# Patient Record
Sex: Female | Born: 1940 | Race: White | Hispanic: No | Marital: Single | Smoking: Never smoker
Health system: Southern US, Community
[De-identification: ages and names within clinical notes are randomized; demographics above are authoritative.]

## PROBLEM LIST (undated history)

## (undated) DIAGNOSIS — E7800 Pure hypercholesterolemia, unspecified: Secondary | ICD-10-CM

## (undated) DIAGNOSIS — F419 Anxiety disorder, unspecified: Secondary | ICD-10-CM

## (undated) DIAGNOSIS — I10 Essential (primary) hypertension: Secondary | ICD-10-CM

## (undated) DIAGNOSIS — M109 Gout, unspecified: Secondary | ICD-10-CM

## (undated) DIAGNOSIS — K922 Gastrointestinal hemorrhage, unspecified: Secondary | ICD-10-CM

## (undated) DIAGNOSIS — I350 Nonrheumatic aortic (valve) stenosis: Secondary | ICD-10-CM

## (undated) HISTORY — PX: DILATION AND CURETTAGE OF UTERUS: SHX78

---

## 1998-04-09 ENCOUNTER — Inpatient Hospital Stay (HOSPITAL_COMMUNITY): Admission: EM | Admit: 1998-04-09 | Discharge: 1998-04-10 | Payer: Self-pay | Admitting: Emergency Medicine

## 1998-04-09 ENCOUNTER — Encounter: Payer: Self-pay | Admitting: Emergency Medicine

## 2005-12-01 ENCOUNTER — Ambulatory Visit: Payer: Self-pay

## 2006-01-28 ENCOUNTER — Ambulatory Visit: Payer: Self-pay | Admitting: Unknown Physician Specialty

## 2006-10-19 ENCOUNTER — Other Ambulatory Visit: Payer: Self-pay

## 2006-10-19 ENCOUNTER — Ambulatory Visit: Payer: Self-pay | Admitting: Unknown Physician Specialty

## 2006-10-20 ENCOUNTER — Ambulatory Visit: Payer: Self-pay | Admitting: Unknown Physician Specialty

## 2017-03-21 ENCOUNTER — Emergency Department: Payer: Medicare Other

## 2017-03-21 ENCOUNTER — Inpatient Hospital Stay
Admission: EM | Admit: 2017-03-21 | Discharge: 2017-03-26 | DRG: 644 | Disposition: A | Discharge status: Home / Self Care | Payer: Medicare Other | Attending: Specialist | Admitting: Specialist

## 2017-03-21 ENCOUNTER — Encounter: Payer: Self-pay | Admitting: Emergency Medicine

## 2017-03-21 DIAGNOSIS — T502X5A Adverse effect of carbonic-anhydrase inhibitors, benzothiadiazides and other diuretics, initial encounter: Secondary | ICD-10-CM | POA: Diagnosis present

## 2017-03-21 DIAGNOSIS — S7012XA Contusion of left thigh, initial encounter: Secondary | ICD-10-CM | POA: Diagnosis present

## 2017-03-21 DIAGNOSIS — E7800 Pure hypercholesterolemia, unspecified: Secondary | ICD-10-CM | POA: Diagnosis present

## 2017-03-21 DIAGNOSIS — M1990 Unspecified osteoarthritis, unspecified site: Secondary | ICD-10-CM | POA: Diagnosis present

## 2017-03-21 DIAGNOSIS — F10239 Alcohol dependence with withdrawal, unspecified: Secondary | ICD-10-CM | POA: Diagnosis present

## 2017-03-21 DIAGNOSIS — I10 Essential (primary) hypertension: Secondary | ICD-10-CM | POA: Diagnosis present

## 2017-03-21 DIAGNOSIS — E222 Syndrome of inappropriate secretion of antidiuretic hormone: Principal | ICD-10-CM | POA: Diagnosis present

## 2017-03-21 DIAGNOSIS — X58XXXA Exposure to other specified factors, initial encounter: Secondary | ICD-10-CM | POA: Diagnosis present

## 2017-03-21 DIAGNOSIS — E871 Hypo-osmolality and hyponatremia: Secondary | ICD-10-CM | POA: Diagnosis present

## 2017-03-21 DIAGNOSIS — E876 Hypokalemia: Secondary | ICD-10-CM | POA: Diagnosis present

## 2017-03-21 DIAGNOSIS — R252 Cramp and spasm: Secondary | ICD-10-CM | POA: Diagnosis not present

## 2017-03-21 DIAGNOSIS — R0789 Other chest pain: Secondary | ICD-10-CM

## 2017-03-21 DIAGNOSIS — E785 Hyperlipidemia, unspecified: Secondary | ICD-10-CM | POA: Diagnosis present

## 2017-03-21 HISTORY — DX: Anxiety disorder, unspecified: F41.9

## 2017-03-21 HISTORY — DX: Gout, unspecified: M10.9

## 2017-03-21 HISTORY — DX: Pure hypercholesterolemia, unspecified: E78.00

## 2017-03-21 HISTORY — DX: Essential (primary) hypertension: I10

## 2017-03-21 LAB — URINALYSIS, ROUTINE W REFLEX MICROSCOPIC
BACTERIA UA: NONE SEEN
BILIRUBIN URINE: NEGATIVE
Glucose, UA: NEGATIVE mg/dL
Hgb urine dipstick: NEGATIVE
Ketones, ur: 5 mg/dL — AB
Leukocytes, UA: NEGATIVE
Nitrite: NEGATIVE
PROTEIN: 100 mg/dL — AB
SPECIFIC GRAVITY, URINE: 1.018 (ref 1.005–1.030)
pH: 5 (ref 5.0–8.0)

## 2017-03-21 LAB — BASIC METABOLIC PANEL
ANION GAP: 13 (ref 5–15)
ANION GAP: 10 (ref 5–15)
ANION GAP: 7 (ref 5–15)
ANION GAP: 10 (ref 5–15)
ANION GAP: 7 (ref 5–15)
ANION GAP: 8 (ref 5–15)
BUN: 14 mg/dL (ref 6–20)
BUN: 14 mg/dL (ref 6–20)
BUN: 12 mg/dL (ref 6–20)
BUN: 11 mg/dL (ref 6–20)
BUN: 11 mg/dL (ref 6–20)
BUN: 11 mg/dL (ref 6–20)
CALCIUM: 9.3 mg/dL (ref 8.9–10.3)
CALCIUM: 8.9 mg/dL (ref 8.9–10.3)
CALCIUM: 8.5 mg/dL — AB (ref 8.9–10.3)
CALCIUM: 8.5 mg/dL — AB (ref 8.9–10.3)
CO2: 22 mmol/L (ref 22–32)
CO2: 24 mmol/L (ref 22–32)
CO2: 24 mmol/L (ref 22–32)
CO2: 22 mmol/L (ref 22–32)
CO2: 23 mmol/L (ref 22–32)
CO2: 22 mmol/L (ref 22–32)
Calcium: 8.4 mg/dL — ABNORMAL LOW (ref 8.9–10.3)
Calcium: 8.2 mg/dL — ABNORMAL LOW (ref 8.9–10.3)
Chloride: 83 mmol/L — ABNORMAL LOW (ref 101–111)
Chloride: 85 mmol/L — ABNORMAL LOW (ref 101–111)
Chloride: 87 mmol/L — ABNORMAL LOW (ref 101–111)
Chloride: 87 mmol/L — ABNORMAL LOW (ref 101–111)
Chloride: 90 mmol/L — ABNORMAL LOW (ref 101–111)
Chloride: 92 mmol/L — ABNORMAL LOW (ref 101–111)
Creatinine, Ser: 0.8 mg/dL (ref 0.44–1.00)
Creatinine, Ser: 0.69 mg/dL (ref 0.44–1.00)
Creatinine, Ser: 0.56 mg/dL (ref 0.44–1.00)
Creatinine, Ser: 0.63 mg/dL (ref 0.44–1.00)
Creatinine, Ser: 0.77 mg/dL (ref 0.44–1.00)
Creatinine, Ser: 0.72 mg/dL (ref 0.44–1.00)
GFR calc Af Amer: >60 mL/min (ref >60)
GFR calc Af Amer: >60 mL/min (ref >60)
GFR calc Af Amer: >60 mL/min (ref >60)
GFR calc Af Amer: >60 mL/min (ref >60)
GFR calc non Af Amer: >60 mL/min (ref >60)
GFR calc non Af Amer: >60 mL/min (ref >60)
GFR calc non Af Amer: >60 mL/min (ref >60)
GLUCOSE: 171 mg/dL — AB (ref 65–99)
GLUCOSE: 130 mg/dL — AB (ref 65–99)
GLUCOSE: 113 mg/dL — AB (ref 65–99)
GLUCOSE: 108 mg/dL — AB (ref 65–99)
Glucose, Bld: 124 mg/dL — ABNORMAL HIGH (ref 65–99)
Glucose, Bld: 124 mg/dL — ABNORMAL HIGH (ref 65–99)
POTASSIUM: 3.8 mmol/L (ref 3.5–5.1)
POTASSIUM: 3.3 mmol/L — AB (ref 3.5–5.1)
POTASSIUM: 3.5 mmol/L (ref 3.5–5.1)
POTASSIUM: 3.5 mmol/L (ref 3.5–5.1)
POTASSIUM: 3.7 mmol/L (ref 3.5–5.1)
Potassium: 3.1 mmol/L — ABNORMAL LOW (ref 3.5–5.1)
SODIUM: 119 mmol/L — AB (ref 135–145)
SODIUM: 118 mmol/L — AB (ref 135–145)
SODIUM: 119 mmol/L — AB (ref 135–145)
Sodium: 118 mmol/L — CL (ref 135–145)
Sodium: 120 mmol/L — ABNORMAL LOW (ref 135–145)
Sodium: 122 mmol/L — ABNORMAL LOW (ref 135–145)

## 2017-03-21 LAB — HEPATIC FUNCTION PANEL
ALT: 23 U/L (ref 14–54)
AST: 30 U/L (ref 15–41)
Albumin: 4.1 g/dL (ref 3.5–5.0)
Alkaline Phosphatase: 62 U/L (ref 38–126)
BILIRUBIN DIRECT: 0.2 mg/dL (ref 0.1–0.5)
BILIRUBIN INDIRECT: 0.9 mg/dL (ref 0.3–0.9)
TOTAL PROTEIN: 7.4 g/dL (ref 6.5–8.1)
Total Bilirubin: 1.1 mg/dL (ref 0.3–1.2)

## 2017-03-21 LAB — CBC
HCT: 34.9 % — ABNORMAL LOW (ref 35.0–47.0)
HEMOGLOBIN: 12.8 g/dL (ref 12.0–16.0)
MCH: 34 pg (ref 26.0–34.0)
MCHC: 36.5 g/dL — ABNORMAL HIGH (ref 32.0–36.0)
MCV: 93 fL (ref 80.0–100.0)
PLATELETS: 293 10*3/uL (ref 150–440)
RBC: 3.76 MIL/uL — AB (ref 3.80–5.20)
RDW: 12.8 % (ref 11.5–14.5)
WBC: 10.8 10*3/uL (ref 3.6–11.0)

## 2017-03-21 LAB — TROPONIN I: Troponin I: <0.03 ng/mL (ref <0.03)

## 2017-03-21 LAB — MAGNESIUM: MAGNESIUM: 1.5 mg/dL — AB (ref 1.7–2.4)

## 2017-03-21 LAB — OSMOLALITY: Osmolality: 252 mOsm/kg — ABNORMAL LOW (ref 275–295)

## 2017-03-21 MED ORDER — LORAZEPAM 1 MG PO TABS
1.0000 mg | ORAL_TABLET | ORAL | Status: DC | PRN
  Administered 2017-03-25: 1 mg via ORAL
  Filled 2017-03-21: qty 1

## 2017-03-21 MED ORDER — PRAVASTATIN SODIUM 20 MG PO TABS
20.0000 mg | ORAL_TABLET | Freq: Every day | ORAL | Status: DC
  Administered 2017-03-21 – 2017-03-25 (×5): 20 mg via ORAL
  Filled 2017-03-21 (×5): qty 1

## 2017-03-21 MED ORDER — ACETAMINOPHEN 650 MG RE SUPP: 650.0000 mg | Freq: Four times a day (QID) | RECTAL | Status: DC | PRN

## 2017-03-21 MED ORDER — VITAMIN B-1 100 MG PO TABS
100.0000 mg | ORAL_TABLET | Freq: Every day | ORAL | Status: DC
  Administered 2017-03-22 – 2017-03-26 (×5): 100 mg via ORAL
  Filled 2017-03-21 (×6): qty 1

## 2017-03-21 MED ORDER — ADULT MULTIVITAMIN W/MINERALS CH
1.0000 | ORAL_TABLET | Freq: Every day | ORAL | Status: DC
  Administered 2017-03-21 – 2017-03-26 (×6): 1 via ORAL
  Filled 2017-03-21 (×6): qty 1

## 2017-03-21 MED ORDER — OMEGA-3-ACID ETHYL ESTERS 1 G PO CAPS
1.0000 g | ORAL_CAPSULE | Freq: Every day | ORAL | Status: DC
  Administered 2017-03-22 – 2017-03-26 (×5): 1 g via ORAL
  Filled 2017-03-21 (×6): qty 1

## 2017-03-21 MED ORDER — THIAMINE HCL 100 MG/ML IJ SOLN
100.0000 mg | Freq: Every day | INTRAMUSCULAR | Status: DC
  Administered 2017-03-21: 100 mg via INTRAVENOUS
  Filled 2017-03-21: qty 2

## 2017-03-21 MED ORDER — ENOXAPARIN SODIUM 40 MG/0.4ML ~~LOC~~ SOLN
40.0000 mg | SUBCUTANEOUS | Status: DC
  Administered 2017-03-21 – 2017-03-26 (×6): 40 mg via SUBCUTANEOUS
  Filled 2017-03-21 (×6): qty 0.4

## 2017-03-21 MED ORDER — SODIUM CHLORIDE 0.9 % IV SOLN
Freq: Once | INTRAVENOUS | Status: AC
  Administered 2017-03-21: 04:00:00 via INTRAVENOUS

## 2017-03-21 MED ORDER — ONDANSETRON HCL 4 MG PO TABS: 4.0000 mg | ORAL_TABLET | Freq: Four times a day (QID) | ORAL | Status: DC | PRN

## 2017-03-21 MED ORDER — LORAZEPAM 2 MG/ML IJ SOLN: 1.0000 mg | Freq: Four times a day (QID) | INTRAMUSCULAR | Status: DC | PRN

## 2017-03-21 MED ORDER — ACETAMINOPHEN 325 MG PO TABS
650.0000 mg | ORAL_TABLET | Freq: Four times a day (QID) | ORAL | Status: DC | PRN
  Administered 2017-03-21 (×2): 650 mg via ORAL
  Filled 2017-03-21 (×3): qty 2

## 2017-03-21 MED ORDER — SODIUM CHLORIDE 0.9 % IV SOLN: INTRAVENOUS | Status: DC

## 2017-03-21 MED ORDER — MAGNESIUM SULFATE 2 GM/50ML IV SOLN
2.0000 g | Freq: Once | INTRAVENOUS | Status: AC
  Administered 2017-03-21: 2 g via INTRAVENOUS
  Filled 2017-03-21: qty 50

## 2017-03-21 MED ORDER — ASPIRIN EC 81 MG PO TBEC
81.0000 mg | DELAYED_RELEASE_TABLET | Freq: Every day | ORAL | Status: DC
  Administered 2017-03-22 – 2017-03-26 (×5): 81 mg via ORAL
  Filled 2017-03-21 (×6): qty 1

## 2017-03-21 MED ORDER — SENNOSIDES-DOCUSATE SODIUM 8.6-50 MG PO TABS: 1.0000 | ORAL_TABLET | Freq: Every evening | ORAL | Status: DC | PRN

## 2017-03-21 MED ORDER — LORAZEPAM 2 MG/ML IJ SOLN
1.0000 mg | INTRAMUSCULAR | Status: DC | PRN
Start: 2017-03-22 — End: 2017-03-26
  Administered 2017-03-22 (×2): 1 mg via INTRAVENOUS
  Filled 2017-03-21 (×2): qty 1

## 2017-03-21 MED ORDER — POTASSIUM CHLORIDE IN NACL 20-0.9 MEQ/L-% IV SOLN
INTRAVENOUS | Status: DC
Start: 2017-03-21 — End: 2017-03-25
  Administered 2017-03-21 – 2017-03-25 (×12): via INTRAVENOUS
  Filled 2017-03-21 (×16): qty 1000

## 2017-03-21 MED ORDER — LORAZEPAM 1 MG PO TABS
1.0000 mg | ORAL_TABLET | Freq: Four times a day (QID) | ORAL | Status: DC | PRN
  Administered 2017-03-21: 1 mg via ORAL
  Filled 2017-03-21 (×2): qty 1

## 2017-03-21 MED ORDER — ONDANSETRON HCL 4 MG/2ML IJ SOLN: 4.0000 mg | Freq: Four times a day (QID) | INTRAMUSCULAR | Status: DC | PRN

## 2017-03-21 MED ORDER — FOLIC ACID 1 MG PO TABS
1.0000 mg | ORAL_TABLET | Freq: Every day | ORAL | Status: DC
  Administered 2017-03-22 – 2017-03-26 (×5): 1 mg via ORAL
  Filled 2017-03-21 (×6): qty 1

## 2017-03-21 MED ORDER — ACETAMINOPHEN 325 MG PO TABS
650.0000 mg | ORAL_TABLET | Freq: Once | ORAL | Status: AC
  Administered 2017-03-21: 650 mg via ORAL
  Filled 2017-03-21: qty 2

## 2017-03-21 MED ORDER — POTASSIUM CHLORIDE 20 MEQ PO PACK
40.0000 meq | PACK | ORAL | Status: AC
  Administered 2017-03-21: 40 meq via ORAL
  Filled 2017-03-21: qty 2

## 2017-03-21 NOTE — ED Notes (Signed)
Pt c/o cramping pain to left thigh and groin area that started today; pt says she gets cramps intermittently all over but the leg pain today was the worst; pt also c/o chest pain while waiting in the lobby; sharp pain to center to chest that is non-radiating; pt says she was diaphoretic when pain started; nausea but no vomiting; also reports shortness of breath; pt says pain has eased some but is still present and she rates her pain 10/10;

## 2017-03-21 NOTE — ED Notes (Signed)
Dr York CeriseForbach at bedside; pt reports cramps intermittently for over a year; was taking Flexeril with some relief; but pt says she has not seen her MD in about 6 months because the office closed;

## 2017-03-21 NOTE — ED Provider Notes (Signed)
[facility] Emergency Department Provider Note  ____________________________________________   First MD Initiated Contact with Patient 03/21/17 [phone]     (approximate)  I have reviewed the triage vital signs and the nursing notes.   HISTORY  Chief Complaint Leg Pain    HPI Angela Sullivan is a 76 y.o. female with medical history as listed below but no medical records in the system nor in care everywhere.  She presents by private vehicle for evaluation of severe leg cramps.  They appear in both legs but are worse than the left.  They have been going on intermittently for months or maybe a year, but have been much worse tonight.  Nothing makes them better nor worse and she describes it as an aching and cramping intermittent pain.  While she was in triage she reported having some upper chest pain so additional protocols were added on which are negative and she says the chest pain is gone.  She is mostly here for evaluation of the leg cramps.  She is a little bit uncertain on the history but she states that recently her primary care doctor may have told her that her sodium is a bit low but did not give her any specific recommendations.  She takes several medications and they have been updated in the system.  One of these includes lisinopril-hydrochlorothiazide.  She currently does not take any electrolyte replacements.  He is having some  Generalized weakness but it is mild.   Past Medical History:  Diagnosis Date  . Anxiety   . Gout   . High cholesterol   . Hypertension     Patient Active Problem List   Diagnosis Date Noted  . Hyponatremia 03/21/2017    History reviewed. No pertinent surgical history.  Prior to Admission medications   Medication Sig Start Date End Date Taking? Authorizing Provider  indomethacin (INDOCIN) 25 MG capsule Take 25 mg by mouth every 6 (six) hours as needed.   Yes [provider]  lisinopril-hydrochlorothiazide  (PRINZIDE,ZESTORETIC) 20-12.5 MG tablet Take 2 tablets by mouth daily.   Yes [provider]  meloxicam (MOBIC) 7.5 MG tablet Take 7.5 mg by mouth 2 (two) times daily.   Yes [provider]  omega-3 acid ethyl esters (LOVAZA) 1 g capsule Take 1 g by mouth daily.   Yes [provider]  pravastatin (PRAVACHOL) 20 MG tablet Take 20 mg by mouth at bedtime.   Yes [provider]    Allergies Patient has no known allergies.  History reviewed. No pertinent family history.  Social History Social History  Substance Use Topics  . Smoking status: Never Smoker  . Smokeless tobacco: Never Used  . Alcohol use No    Review of Systems Constitutional: No fever/chills Eyes: No visual changes. ENT: No sore throat. Cardiovascular: Recent episode of nonradiating upper chest pain, now resolved Respiratory: Denies shortness of breath. Gastrointestinal: No abdominal pain.  No nausea, no vomiting.  No diarrhea.  No constipation. Genitourinary: Negative for dysuria. Musculoskeletal: Persistent lower extremity cramps worse on the left, severe for the last several days.  negative for neck pain.  Negative for back pain. Integumentary: Negative for rash. Neurological: Negative for headaches, focal weakness or numbness.   ____________________________________________   PHYSICAL EXAM:  VITAL SIGNS: ED Triage Vitals  Enc Vitals Group     BP 03/21/17 0026 127/69     Pulse Rate 03/21/17 0026 84     Resp 03/21/17 0026 20     Temp  03/21/17 0026 98.4 F (36.9 C)     Temp Source 03/21/17 0026 Oral     SpO2 03/21/17 0026 97 %     Weight 03/21/17 0023 90.7 kg (200 lb)     Height 03/21/17 0023 1.575 m (5\' 2" )     Head Circumference --      Peak Flow --      Pain Score 03/21/17 0023 0     Pain Loc --      Pain Edu? --      Excl. in GC? --     Constitutional: Alert and oriented. Well appearing and in no acute distress. Eyes: Conjunctivae are normal.  Head:  Atraumatic. Nose: No congestion/rhinnorhea. Mouth/Throat: Mucous membranes are moist. Neck: No stridor.  No meningeal signs.   Cardiovascular: Normal rate, regular rhythm. Good peripheral circulation. Grossly normal heart sounds. Respiratory: Normal respiratory effort.  No retractions. Lungs CTAB. Gastrointestinal: Soft and nontender. No distention.  Musculoskeletal: No lower extremity tenderness nor edema. No gross deformities of extremities.  N/V intact, no evidence of vascular obstruction Neurologic:  Normal speech and language. No gross focal neurologic deficits are appreciated.  Skin:  Skin is warm, dry and intact. No rash noted. Psychiatric: Mood and affect are normal. Speech and behavior are normal.  ____________________________________________   LABS (all labs ordered are listed, but only abnormal results are displayed)  Labs Reviewed  BASIC METABOLIC PANEL - Abnormal; Notable for the following:       Result Value   Sodium 118 (*)    Potassium 3.1 (*)    Chloride 83 (*)    Glucose, Bld 124 (*)    All other components within normal limits  CBC - Abnormal; Notable for the following:    RBC 3.76 (*)    HCT 34.9 (*)    MCHC 36.5 (*)    All other components within normal limits  URINALYSIS, ROUTINE W REFLEX MICROSCOPIC - Abnormal; Notable for the following:    Color, Urine YELLOW (*)    APPearance CLEAR (*)    Ketones, ur 5 (*)    Protein, ur 100 (*)    Squamous Epithelial / LPF 0-5 (*)    All other components within normal limits  MAGNESIUM - Abnormal; Notable for the following:    Magnesium 1.5 (*)    All other components within normal limits  TROPONIN I  HEPATIC FUNCTION PANEL  OSMOLALITY  TROPONIN I  BASIC METABOLIC PANEL  BASIC METABOLIC PANEL  BASIC METABOLIC PANEL  BASIC METABOLIC PANEL  BASIC METABOLIC PANEL   ____________________________________________  EKG  ED ECG REPORT I, Jadeyn Hargett, the attending physician, personally viewed and interpreted  this ECG.  Date: 03/21/2017 EKG Time: 2:25 AM Rate: 82 Rhythm: normal sinus rhythm QRS Axis: normal Intervals: normal ST/T Wave abnormalities: normal Narrative Interpretation: no evidence of acute ischemia  ____________________________________________  RADIOLOGY   Dg Chest 2 View  Result Date: 03/21/2017 CLINICAL DATA:  Non radiating upper chest pain. EXAM: CHEST  2 VIEW COMPARISON:  None available for comparison at time of study interpretation. FINDINGS: Cardiomediastinal silhouette is normal. No pleural effusions or focal consolidations. Mild bronchitic changes. Trachea projects midline and there is no pneumothorax. Soft tissue planes and included osseous structures are non-suspicious. Severe cervical facet arthropathy. Osteopenia. IMPRESSION: Mild bronchitic changes without focal consolidation. Electronically Signed   By: Awilda Metroourtnay  Bloomer M.D.   On: 03/21/2017 02:59    ____________________________________________   PROCEDURES  Critical Care performed: No   Procedure(s) performed:  Procedures   ____________________________________________   INITIAL IMPRESSION / ASSESSMENT AND PLAN / ED COURSE  As part of my medical decision making, I reviewed the following data within the electronic MEDICAL RECORD NUMBER Nursing notes reviewed and incorporated, Labs reviewed  and Discussed with admitting physician (Dr. Tobi Bastos)  Differential diagnosis includes injury, DVT, muscle cramps, nonspecific musculoskeletal injury, arterial occlusion, etc.  However, her workup is notable for profound hyponatremia with a sodium of 118, normal glucose, hypokalemia of 3.1, and normal urinalysis.  Her troponin is negative and her chest x-ray is unremarkable.  I believe she is most likely having leg cramps due to electrolyte abnormalities.  Given that we cannot verify if this is acute or chronic hyponatremia, I will err on the side of caution given the morbidity and mortality associated with rapid  correction.  I am starting normal saline infusion at 150 mL/h and correcting the potassium with oral potassium 40 mEq.  I have added on a magnesium osmolality.  A repeat troponin is scheduled at 5:30 AM.  I will plan to admit.  I updated the patient and she understands and agrees.   Clinical Course as of Mar 21 433  Mon Mar 21, 2017  0359 Will replete magnesium as well with 2g IV Magnesium: (!) 1.5 [CF]  0401 Paged hospitalist to discuss case and admit.  [CF]  0407 Discussed case with Dr. Tobi Bastos the hospitalist who will admit.  [CF]    Clinical Course User Index [CF] Loleta Rose, MD    ____________________________________________  FINAL CLINICAL IMPRESSION(S) / ED DIAGNOSES  Final diagnoses:  Leg cramps  Hyponatremia  Hypokalemia  Atypical chest pain  Hypomagnesemia     MEDICATIONS GIVEN DURING THIS VISIT:  Medications  magnesium sulfate IVPB 2 g 50 mL (2 g Intravenous New Bag/Given 03/21/17 0410)  acetaminophen (TYLENOL) tablet 650 mg (650 mg Oral Given 03/21/17 0148)  0.9 %  sodium chloride infusion ( Intravenous New Bag/Given 03/21/17 0403)  potassium chloride (KLOR-CON) packet 40 mEq (40 mEq Oral Given 03/21/17 0403)     NEW OUTPATIENT MEDICATIONS STARTED DURING THIS VISIT:  New Prescriptions   No medications on file    Modified Medications   No medications on file    Discontinued Medications   No medications on file     Note:  This document was prepared using Dragon voice recognition software and may include unintentional dictation errors.    Loleta Rose, MD 03/21/17 [phone]

## 2017-03-21 NOTE — Progress Notes (Signed)
Lab called with critical lab Na 119, MD aware.

## 2017-03-21 NOTE — ED Notes (Signed)
Patient transported to X-ray 

## 2017-03-21 NOTE — H&P (Signed)
[facility] [place] at [facility]   PATIENT NAME: Angela Sullivan    MR#:  [id number]  DATE OF BIRTH:  04-11-1941  DATE OF ADMISSION:  03/21/2017  PRIMARY CARE PHYSICIAN: Patient, No Pcp Per   REQUESTING/REFERRING PHYSICIAN:   CHIEF COMPLAINT:   Chief Complaint  Patient presents with  . Leg Pain    HISTORY OF PRESENT ILLNESS: Angela Sullivan  is a 76 y.o. female with a known history of anxiety disorder, gout, hyperlipidemia, hypertension presented to the emergency room with leg cramps since one day. Patient has cramps in both the lower extremities since last 1 day. Feels weak and tired. She was evaluated in the emergency room her sodium, potassium and magnesium levels were low. Sodium was around 118. No complaints of any seizures. Patient is awake and alert and responds to all verbal commands. No confusion. Hospitalist service was consulted for further care. She also complained of chest discomfort in the emergency room. Her first set of troponin was negative. The chest discomfort was more awake and on and off. Aching in nature 2 out of 10 scale of 1 to10.  PAST MEDICAL HISTORY:   Past Medical History:  Diagnosis Date  . Anxiety   . Gout   . High cholesterol   . Hypertension     PAST SURGICAL HISTORY: Past Surgical History:  Procedure Laterality Date  . DILATION AND CURETTAGE OF UTERUS      SOCIAL HISTORY:  Social History  Substance Use Topics  . Smoking status: Never Smoker  . Smokeless tobacco: Never Used  . Alcohol use Yes     Comment: occasional    FAMILY HISTORY:  Family History  Problem Relation Age of Onset  . Heart disease Father   . COPD Neg Hx   . Diabetes Mellitus II Neg Hx     DRUG ALLERGIES: No Known Allergies  REVIEW OF SYSTEMS:   CONSTITUTIONAL: No fever, has weakness.  EYES: No blurred or double vision.  EARS, NOSE, AND THROAT: No tinnitus or ear pain.  RESPIRATORY: No cough, shortness of breath, wheezing or hemoptysis.   CARDIOVASCULAR: No chest pain, orthopnea, edema.  GASTROINTESTINAL: No nausea, vomiting, diarrhea or abdominal pain.  GENITOURINARY: No dysuria, hematuria.  ENDOCRINE: No polyuria, nocturia,  HEMATOLOGY: No anemia, easy bruising or bleeding SKIN: No rash or lesion. MUSCULOSKELETAL: No joint pain or arthritis.   Has lower extremity muscle cramps. NEUROLOGIC: No tingling, numbness, weakness.  PSYCHIATRY: No anxiety or depression.   MEDICATIONS AT HOME:  Prior to Admission medications   Medication Sig Start Date End Date Taking? Authorizing Provider  Cholecalciferol 1000 units tablet Take 5,000 Units by mouth daily.   Yes [provider]  indomethacin (INDOCIN) 25 MG capsule Take 25 mg by mouth every 6 (six) hours as needed.   Yes [provider]  lisinopril-hydrochlorothiazide (PRINZIDE,ZESTORETIC) 20-12.5 MG tablet Take 2 tablets by mouth daily.   Yes [provider]  meloxicam (MOBIC) 7.5 MG tablet Take 7.5 mg by mouth 2 (two) times daily.   Yes [provider]  omega-3 acid ethyl esters (LOVAZA) 1 g capsule Take 1 g by mouth daily.   Yes [provider]  pravastatin (PRAVACHOL) 20 MG tablet Take 20 mg by mouth at bedtime.   Yes [provider]      PHYSICAL EXAMINATION:   VITAL SIGNS: Blood pressure (!) 151/51, pulse 81, temperature 98.4 F (36.9 C), temperature source Oral, resp. rate 18, height 5\' 2"  (1.575 m), weight 90.7  kg (200 lb), SpO2 97 %.  GENERAL:  76 y.o.-year-old patient lying in the bed with no acute distress.  EYES: Pupils equal, round, reactive to light and accommodation. No scleral icterus. Extraocular muscles intact.  HEENT: Head atraumatic, normocephalic. Oropharynx and nasopharynx clear.  NECK:  Supple, no jugular venous distention. No thyroid enlargement, no tenderness.  LUNGS: Normal breath sounds bilaterally, no wheezing, rales,rhonchi or crepitation. No use of accessory muscles of respiration.   CARDIOVASCULAR: S1, S2 normal. No murmurs, rubs, or gallops.  ABDOMEN: Soft, nontender, nondistended. Bowel sounds present. No organomegaly or mass.  EXTREMITIES: No pedal edema, cyanosis, or clubbing.  Muscle spasms lower extremities NEUROLOGIC: Cranial nerves II through XII are intact. Muscle strength 5/5 in all extremities. Sensation intact. Gait not checked.  PSYCHIATRIC: The patient is alert and oriented x 3.  SKIN: No obvious rash, lesion, or ulcer.   LABORATORY PANEL:   CBC  Recent Labs Lab 03/21/17 0231  WBC 10.8  HGB 12.8  HCT 34.9*  PLT 293  MCV 93.0  MCH 34.0  MCHC 36.5*  RDW 12.8   ------------------------------------------------------------------------------------------------------------------  Chemistries   Recent Labs Lab 03/21/17 0231  NA 118*  K 3.1*  CL 83*  CO2 22  GLUCOSE 124*  BUN 14  CREATININE 0.80  CALCIUM 9.3  MG 1.5*  AST 30  ALT 23  ALKPHOS 62  BILITOT 1.1   ------------------------------------------------------------------------------------------------------------------ estimated creatinine clearance is 62.6 mL/min (by C-G formula based on SCr of 0.8 mg/dL). ------------------------------------------------------------------------------------------------------------------ No results for input(s): TSH, T4TOTAL, T3FREE, THYROIDAB in the last 72 hours.  Invalid input(s): FREET3   Coagulation profile No results for input(s): INR, PROTIME in the last 168 hours. ------------------------------------------------------------------------------------------------------------------- No results for input(s): DDIMER in the last 72 hours. -------------------------------------------------------------------------------------------------------------------  Cardiac Enzymes  Recent Labs Lab 03/21/17 0231  TROPONINI <0.03    ------------------------------------------------------------------------------------------------------------------ Invalid input(s): POCBNP  ---------------------------------------------------------------------------------------------------------------  Urinalysis    Component Value Date/Time   COLORURINE YELLOW (A) 03/21/2017 0415   APPEARANCEUR CLEAR (A) 03/21/2017 0415   LABSPEC 1.018 03/21/2017 0415   PHURINE 5.0 03/21/2017 0415   GLUCOSEU NEGATIVE 03/21/2017 0415   HGBUR NEGATIVE 03/21/2017 0415   BILIRUBINUR NEGATIVE 03/21/2017 0415   KETONESUR 5 (A) 03/21/2017 0415   PROTEINUR 100 (A) 03/21/2017 0415   NITRITE NEGATIVE 03/21/2017 0415   LEUKOCYTESUR NEGATIVE 03/21/2017 0415     RADIOLOGY: Dg Chest 2 View  Result Date: 03/21/2017 CLINICAL DATA:  Non radiating upper chest pain. EXAM: CHEST  2 VIEW COMPARISON:  None available for comparison at time of study interpretation. FINDINGS: Cardiomediastinal silhouette is normal. No pleural effusions or focal consolidations. Mild bronchitic changes. Trachea projects midline and there is no pneumothorax. Soft tissue planes and included osseous structures are non-suspicious. Severe cervical facet arthropathy. Osteopenia. IMPRESSION: Mild bronchitic changes without focal consolidation. Electronically Signed   By: Awilda Metro M.D.   On: 03/21/2017 02:59    EKG: Orders placed or performed during the hospital encounter of 03/21/17  . ED EKG within 10 minutes  . ED EKG within 10 minutes  . EKG 12-Lead  . EKG 12-Lead    IMPRESSION AND PLAN: 76 year old female patient with history of hypertension, hyperlipidemia, gout, anxiety disorder presented to the emergency room with cramps in both lower extremities.  Admitting diagnosis 1. Hyponatremia 2. Hypokalemia 3. Hypomagnesemia 4. Hypertension 5. Hyperlipidemia 6. Atypical chest pain Treatment plan Admit patient to telemetry inpatient service IV normal saline  hydration Follow-up sodium level Potassium supplementation Replace magnesium Follow-up electrolytes Cycle troponin.  All  the records are reviewed and case discussed with ED provider. Management plans discussed with the patient, family and they are in agreement.  CODE STATUS:FULL CODE Code Status History    This patient does not have a recorded code status. Please follow your organizational policy for patients in this situation.       TOTAL TIME TAKING CARE OF THIS PATIENT: 50 minutes.    Ihor AustinPavan Pyreddy M.D on 03/21/2017 at 4:46 AM  Between 7am to 6pm - Pager - [phone]  After 6pm go to [email or web address] - password [email or web address]  [facility] [place] [facility]  Office  [phone]  CC: Primary care physician; Patient, No Pcp Per

## 2017-03-21 NOTE — ED Notes (Signed)
Pt's friend Vikki PortsValerie leaving and taking pt's purse and medications; pt will keep her clothing and glasses only

## 2017-03-21 NOTE — ED Notes (Signed)
Patient reports now having upper chest pain that does not radiate.  Patient denies any other type of symptoms.  Protocols and EKG order and done.

## 2017-03-21 NOTE — Progress Notes (Signed)
Assumed care of pt. Resting in bed, IVF infusing well rt hand.  No distress on ra.  Cardiac monitor in place, denies chest pain. CB in reach, SR up x2.

## 2017-03-21 NOTE — ED Triage Notes (Signed)
Patient reports having leg cramps tonight that are worse than usual.  Patient reports pain when attempting to stand.  In triage patient denies pain at this time and does not appear in any distress.

## 2017-03-21 NOTE — Plan of Care (Signed)
Problem: Safety: Goal: Ability to remain free from injury will improve Outcome: Progressing Fall precautions in place, non skid socks  When oob  Problem: Tissue Perfusion: Goal: Risk factors for ineffective tissue perfusion will decrease Outcome: Progressing SQ Lovenox

## 2017-03-21 NOTE — Progress Notes (Signed)
VS is stable. Better leg cramp. 1. Hyponatremia, continue NS iv, f/u Na. Nephrology consult. 2. Hypokalemia, given KCl, f/u BMP. 3. Hypomagnesemia, given Mag iv, f/u mag. 4. Hypertension 5. Hyperlipidemia Alcohol abuse. Start CIWA protocol. Tobacco abuse. Smoking cessation was counseled for 3-4 minutes.  Time spent: 36 minutes.

## 2017-03-22 LAB — BASIC METABOLIC PANEL
Anion gap: 7 (ref 5–15)
BUN: 10 mg/dL (ref 6–20)
CALCIUM: 8 mg/dL — AB (ref 8.9–10.3)
CO2: 23 mmol/L (ref 22–32)
CREATININE: 0.58 mg/dL (ref 0.44–1.00)
Chloride: 93 mmol/L — ABNORMAL LOW (ref 101–111)
GFR calc Af Amer: >60 mL/min (ref >60)
GFR calc non Af Amer: >60 mL/min (ref >60)
GLUCOSE: 105 mg/dL — AB (ref 65–99)
Potassium: 3.7 mmol/L (ref 3.5–5.1)
Sodium: 123 mmol/L — ABNORMAL LOW (ref 135–145)

## 2017-03-22 LAB — MAGNESIUM: Magnesium: 1.7 mg/dL (ref 1.7–2.4)

## 2017-03-22 NOTE — Progress Notes (Signed)
[facility] - [place] at [facility]   PATIENT NAME: Angela Sullivan    MR#:  [id number]  DATE OF BIRTH:  Jan 25, 1941  SUBJECTIVE:  CHIEF COMPLAINT:   Chief Complaint  Patient presents with  . Leg Pain   No leg cramps but has generalized weakness. REVIEW OF SYSTEMS:  Review of Systems  Constitutional: Positive for malaise/fatigue. Negative for chills and fever.  HENT: Negative for sore throat.   Eyes: Negative for blurred vision and double vision.  Respiratory: Negative for cough, hemoptysis, shortness of breath, wheezing and stridor.   Cardiovascular: Negative for chest pain, palpitations, orthopnea and leg swelling.  Gastrointestinal: Negative for abdominal pain, blood in stool, diarrhea, melena, nausea and vomiting.  Genitourinary: Negative for dysuria, flank pain and hematuria.  Musculoskeletal: Negative for back pain and joint pain.  Skin: Negative for rash.  Neurological: Positive for weakness. Negative for dizziness, tremors, sensory change, focal weakness, seizures, loss of consciousness and headaches.  Endo/Heme/Allergies: Negative for polydipsia.  Psychiatric/Behavioral: Negative for depression. The patient is not nervous/anxious.     DRUG ALLERGIES:  No Known Allergies VITALS:  Blood pressure (!) 147/55, pulse (!) 101, temperature 98.9 F (37.2 C), temperature source Oral, resp. rate 18, height 5\' 2"  (1.575 m), weight 200 lb (90.7 kg), SpO2 96 %. PHYSICAL EXAMINATION:  Physical Exam  Constitutional: She is oriented to person, place, and time and well-developed, well-nourished, and in no distress.  Morbid obesity  HENT:  Head: Normocephalic.  Mouth/Throat: Oropharynx is clear and moist.  Eyes: Pupils are equal, round, and reactive to light. Conjunctivae and EOM are normal. No scleral icterus.  Neck: Normal range of motion. Neck supple. No JVD present. No tracheal deviation present.  Cardiovascular: Normal rate, regular rhythm and normal heart  sounds.  Exam reveals no gallop.   No murmur heard. Pulmonary/Chest: Effort normal and breath sounds normal. No respiratory distress. She has no wheezes. She has no rales.  Abdominal: Soft. Bowel sounds are normal. She exhibits no distension. There is no tenderness. There is no rebound.  Musculoskeletal: Normal range of motion. She exhibits no edema or tenderness.  Neurological: She is alert and oriented to person, place, and time. No cranial nerve deficit.  Skin: No rash noted. No erythema.  Psychiatric: Affect normal.   LABORATORY PANEL:  Female CBC  Recent Labs Lab 03/21/17 0231  WBC 10.8  HGB 12.8  HCT 34.9*  PLT 293   ------------------------------------------------------------------------------------------------------------------ Chemistries   Recent Labs Lab 03/21/17 0231  03/22/17 0231  NA 118*  < > 123*  K 3.1*  < > 3.7  CL 83*  < > 93*  CO2 22  < > 23  GLUCOSE 124*  < > 105*  BUN 14  < > 10  CREATININE 0.80  < > 0.58  CALCIUM 9.3  < > 8.0*  MG 1.5*  --  1.7  AST 30  --   --   ALT 23  --   --   ALKPHOS 62  --   --   BILITOT 1.1  --   --   < > = values in this interval not displayed. RADIOLOGY:  No results found. ASSESSMENT AND PLAN:  76 year old female patient with history of hypertension, hyperlipidemia, gout, anxiety disorder presented to the emergency room with cramps in both lower extremities.  1. Hyponatremia, continue NS iv, f/u Na. Nephrology consult. 2. Hypokalemia, given KCl, improved.. 3. Hypomagnesemia, given Mag iv, improved. 4. Hypertension, controlled. 5. Hyperlipidemia. Continue standing. Alcohol abuse  and withdrawal. On CIWA protocol. Tobacco abuse. Smoking cessation was counseled for 3-4 minutes. Morbid obesity.  All the records are reviewed and case discussed with Care Management/Social Worker. Management plans discussed with the patient, family and they are in agreement.  CODE STATUS: Full Code  TOTAL TIME TAKING CARE OF THIS  PATIENT: 36 minutes.   More than 50% of the time was spent in counseling/coordination of care: YES  POSSIBLE D/C IN 2-3 DAYS, DEPENDING ON CLINICAL CONDITION.   Angela Sullivan, Angela Sullivan M.D on 03/22/2017 at 2:34 PM  Between 7am to 6pm - Pager - [phone]  After 6pm go to [email or web address] - Therapist, nutritionalpassword [email or web address]  [facility] [place] [facility]

## 2017-03-22 NOTE — Consult Note (Signed)
[facility] CONSULT NOTE    Date: 03/22/2017                  Patient Name:  Angela Sullivan  MRN: [id number]  DOB: 1940/08/14  Age / Sex: 76 y.o., female         PCP: Patient, No Pcp Per                 Service Requesting Consult: Hospitalist                 Reason for Consult: Hyponatremia            History of Present Illness: Patient is a 76 y.o. female with a PMHx of gout, hyperlipidemia, hypertension, anxiety, who was admitted to [facility] on 03/21/2017 for evaluation of lower extremity cramps.  Unfortunately the patient nable to fully concentrate on providing history.  She was initially lethargic when we saw her.  We are asked to see her for evaluation management of hyponatremia.  Serum sodium was noted to be 118 upon admission.  Currently serum sodium was up to 123.  Patient was noted to be on lisinopril/HCTZ.  This has been appropriately held and she is receiving IV fluid hydration.  Patient also had hypokalemia along with hypomagnesemia.  Serum potassium initially was 3.1 and magnesium was low at 1.5.  Serum potassium is now up to 3.7.   Medications: Outpatient medications: Prescriptions Prior to Admission  Medication Sig Dispense Refill Last Dose  . Cholecalciferol 1000 units tablet Take 5,000 Units by mouth daily.   03/20/2017 at 0700  . indomethacin (INDOCIN) 25 MG capsule Take 25 mg by mouth every 6 (six) hours as needed.   prn at prn  . lisinopril-hydrochlorothiazide (PRINZIDE,ZESTORETIC) 20-12.5 MG tablet Take 2 tablets by mouth daily.   03/20/2017 at 0700  . Melatonin 5 MG TABS Take 2 tablets by mouth at bedtime.   Unknown at Unknown  . meloxicam (MOBIC) 7.5 MG tablet Take 7.5 mg by mouth 2 (two) times daily.   03/20/2017 at 2100  . omega-3 acid ethyl esters (LOVAZA) 1 g capsule Take 1 g by mouth daily.   03/20/2017 at 0700  . pravastatin (PRAVACHOL) 20 MG tablet Take 20 mg by mouth at bedtime.   03/20/2017 at 2100    Current medications: Current  Facility-Administered Medications  Medication Dose Route Frequency Provider Last Rate Last Dose  . 0.9 % NaCl with KCl 20 mEq/ L  infusion   Intravenous Continuous Ihor Austin, MD 125 mL/hr at 03/22/17 0944    . acetaminophen (TYLENOL) tablet 650 mg  650 mg Oral Q6H PRN Ihor Austin, MD   650 mg at 03/21/17 1406   Or  . acetaminophen (TYLENOL) suppository 650 mg  650 mg Rectal Q6H PRN Ihor Austin, MD      . aspirin EC tablet 81 mg  81 mg Oral Daily Pyreddy, Vivien Rota, MD   81 mg at 03/22/17 0942  . enoxaparin (LOVENOX) injection 40 mg  40 mg Subcutaneous Q24H Ihor Austin, MD   40 mg at 03/22/17 0538  . folic acid (FOLVITE) tablet 1 mg  1 mg Oral Daily Shaune Pollack, MD   1 mg at 03/22/17 [phone]  . LORazepam (ATIVAN) tablet 1 mg  1 mg Oral Q4H PRN Milagros Loll, MD       Or  . LORazepam (ATIVAN) injection 1 mg  1 mg Intravenous Q4H PRN Milagros Loll, MD   1 mg at 03/22/17 0447  . multivitamin  with minerals tablet 1 tablet  1 tablet Oral Daily Shaune Pollack, MD   1 tablet at 03/22/17 [phone]  . omega-3 acid ethyl esters (LOVAZA) capsule 1 g  1 g Oral Daily Pyreddy, Pavan, MD   1 g at 03/22/17 0942  . ondansetron (ZOFRAN) tablet 4 mg  4 mg Oral Q6H PRN Ihor Austin, MD       Or  . ondansetron (ZOFRAN) injection 4 mg  4 mg Intravenous Q6H PRN Pyreddy, Pavan, MD      . pravastatin (PRAVACHOL) tablet 20 mg  20 mg Oral QHS Ihor Austin, MD   20 mg at 03/21/17 2118  . senna-docusate (Senokot-S) tablet 1 tablet  1 tablet Oral QHS PRN Pyreddy, Vivien Rota, MD      . thiamine (VITAMIN B-1) tablet 100 mg  100 mg Oral Daily Shaune Pollack, MD   100 mg at 03/22/17 1914   Or  . thiamine (B-1) injection 100 mg  100 mg Intravenous Daily Shaune Pollack, MD   100 mg at 03/21/17 1028      Allergies: No Known Allergies    Past Medical History: Past Medical History:  Diagnosis Date  . Anxiety   . Gout   . High cholesterol   . Hypertension      Past Surgical History: Past Surgical History:  Procedure  Laterality Date  . DILATION AND CURETTAGE OF UTERUS       Family History: Family History  Problem Relation Age of Onset  . Heart disease Father   . COPD Neg Hx   . Diabetes Mellitus II Neg Hx      Social History: Social History   Social History  . Marital status: Single    Spouse name: N/A  . Number of children: N/A  . Years of education: N/A   Occupational History  .  Retired   Social History Main Topics  . Smoking status: Never Smoker  . Smokeless tobacco: Never Used  . Alcohol use Yes     Comment: occasional  . Drug use: No  . Sexual activity: Not on file   Other Topics Concern  . Not on file   Social History Narrative  . No narrative on file     Review of Systems: Patient unable to offer accurate review systems.  Vital Signs: Blood pressure (!) 147/55, pulse (!) 101, temperature 98.9 F (37.2 C), temperature source Oral, resp. rate 18, height 5\' 2"  (1.575 m), weight 90.7 kg (200 lb), SpO2 96 %.  Weight trends: Filed Weights   03/21/17 0023  Weight: 90.7 kg (200 lb)    Physical Exam: General: NAD, resting in bed  Head: Normocephalic, atraumatic.  Eyes: Anicteric, EOMI  Nose: Mucous membranes moist, not inflammed, nonerythematous.  Throat: Oropharynx nonerythematous, no exudate appreciated.   Neck: Supple, trachea midline.  Lungs:  Normal respiratory effort. Clear to auscultation BL without crackles or wheezes.  Heart: RRR. S1 and S2 normal without gallop, murmur, or rubs.  Abdomen:  BS normoactive. Soft, Nondistended, non-tender.  No masses or organomegaly.  Extremities: trace pretibial edema.  Neurologic: Lethargic but arousable, confused at times  Skin: No visible rashes, scars.    Lab results: Basic Metabolic Panel:  Recent Labs Lab 03/21/17 0231  03/21/17 1840 03/21/17 2236 03/22/17 0231  NA 118*  < > 120* 122* 123*  K 3.1*  < > 3.5 3.7 3.7  CL 83*  < > 90* 92* 93*  CO2 22  < > 23 22 23   GLUCOSE 124*  < >  113* 108* 105*  BUN  14  < > 11 11 10   CREATININE 0.80  < > 0.77 0.72 0.58  CALCIUM 9.3  < > 8.4* 8.2* 8.0*  MG 1.5*  --   --   --  1.7  < > = values in this interval not displayed.  Liver Function Tests:  Recent Labs Lab 03/21/17 0231  AST 30  ALT 23  ALKPHOS 62  BILITOT 1.1  PROT 7.4  ALBUMIN 4.1   No results for input(s): LIPASE, AMYLASE in the last 168 hours. No results for input(s): AMMONIA in the last 168 hours.  CBC:  Recent Labs Lab 03/21/17 0231  WBC 10.8  HGB 12.8  HCT 34.9*  MCV 93.0  PLT 293    Cardiac Enzymes:  Recent Labs Lab 03/21/17 0231 03/21/17 0527  TROPONINI <0.03 <0.03    BNP: Invalid input(s): POCBNP  CBG: No results for input(s): GLUCAP in the last 168 hours.  Microbiology: No results found for this or any previous visit.  Coagulation Studies: No results for input(s): LABPROT, INR in the last 72 hours.  Urinalysis:  Recent Labs  03/21/17 0415  COLORURINE YELLOW*  LABSPEC 1.018  PHURINE 5.0  GLUCOSEU NEGATIVE  HGBUR NEGATIVE  BILIRUBINUR NEGATIVE  KETONESUR 5*  PROTEINUR 100*  NITRITE NEGATIVE  LEUKOCYTESUR NEGATIVE      Imaging: Dg Chest 2 View  Result Date: 03/21/2017 CLINICAL DATA:  Non radiating upper chest pain. EXAM: CHEST  2 VIEW COMPARISON:  None available for comparison at time of study interpretation. FINDINGS: Cardiomediastinal silhouette is normal. No pleural effusions or focal consolidations. Mild bronchitic changes. Trachea projects midline and there is no pneumothorax. Soft tissue planes and included osseous structures are non-suspicious. Severe cervical facet arthropathy. Osteopenia. IMPRESSION: Mild bronchitic changes without focal consolidation. Electronically Signed   By: Awilda Metroourtnay  Bloomer M.D.   On: 03/21/2017 02:59      Assessment & Plan: Pt is a 76 y.o. female  with a PMHx of gout, hyperlipidemia, hypertension, anxiety, who was [facility]21[facility]2017 for evaluation of lower extremity cramps.  Patient  found to have hyponatremia, hypokalemia, and hypomagnesemia upon admission.  She was noted to be taking lisinopril/HCTZ as an outpatient.  1.  Hyponatremia. 2.  Hypokalemia at admission, improved. 3.  Hypomagnesemia.  Plan: The patient apparently presented with leg cramps upon admission.  She was noted to be on lisinopril/HCTZ which has been appropriately held.  Patient was started on IV fluid hydration and serum sodium is now gone from 118-123.  Continue IV fluid hydration and followup serum sodium as well as potassium tomorrow.  Serum potassium has improved since admission.  We recommend repeating serum magnesium tomorrow as well as serum magnesium was low at 1.5.  Recommend avoiding HCTZ in this patient has it can lead to multiple electrolyte abnormalities.  Thanks for consultation.

## 2017-03-22 NOTE — Progress Notes (Signed)
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 PRN Ativan changed to Q4 from Q6. VSS,with HR in the 90s.

## 2017-03-23 LAB — OSMOLALITY: Osmolality: 261 mOsm/kg — ABNORMAL LOW (ref 275–295)

## 2017-03-23 LAB — BASIC METABOLIC PANEL
Anion gap: 4 — ABNORMAL LOW (ref 5–15)
BUN: 8 mg/dL (ref 6–20)
CHLORIDE: 97 mmol/L — AB (ref 101–111)
CO2: 24 mmol/L (ref 22–32)
CREATININE: 0.66 mg/dL (ref 0.44–1.00)
Calcium: 8 mg/dL — ABNORMAL LOW (ref 8.9–10.3)
GFR calc Af Amer: >60 mL/min (ref >60)
GFR calc non Af Amer: >60 mL/min (ref >60)
GLUCOSE: 101 mg/dL — AB (ref 65–99)
POTASSIUM: 3.6 mmol/L (ref 3.5–5.1)
SODIUM: 125 mmol/L — AB (ref 135–145)

## 2017-03-23 LAB — OSMOLALITY, URINE: Osmolality, Ur: 434 mOsm/kg (ref 300–900)

## 2017-03-23 LAB — URIC ACID: URIC ACID, SERUM: 2.6 mg/dL (ref 2.3–6.6)

## 2017-03-23 LAB — SODIUM, URINE, RANDOM: SODIUM UR: 126 mmol/L

## 2017-03-23 LAB — MAGNESIUM: MAGNESIUM: 1.5 mg/dL — AB (ref 1.7–2.4)

## 2017-03-23 LAB — TSH: TSH: 1.402 u[IU]/mL (ref 0.350–4.500)

## 2017-03-23 MED ORDER — MAGNESIUM SULFATE 2 GM/50ML IV SOLN
2.0000 g | Freq: Once | INTRAVENOUS | Status: AC
  Administered 2017-03-23: 2 g via INTRAVENOUS
  Filled 2017-03-23: qty 50

## 2017-03-23 MED ORDER — AMLODIPINE BESYLATE 5 MG PO TABS
5.0000 mg | ORAL_TABLET | Freq: Every day | ORAL | Status: DC
  Administered 2017-03-23 – 2017-03-26 (×4): 5 mg via ORAL
  Filled 2017-03-23 (×4): qty 1

## 2017-03-23 NOTE — Progress Notes (Signed)
[employer] [facility] [facility] [place] [facility]   PATIENT NAME: Angela Sullivan    MR#:  [id number]  DATE OF BIRTH:  [birth year]  SUBJECTIVE:   No acute events overnight. Patient's sodium level is slowly improving.  REVIEW OF SYSTEMS:    Review of Systems  Constitutional: Negative for fever, chills weight loss HENT: Negative for ear pain, nosebleeds, congestion, facial swelling, rhinorrhea, neck pain, neck stiffness and ear discharge.   Respiratory: Negative for cough, shortness of breath, wheezing  Cardiovascular: Negative for chest pain, palpitations and leg swelling.  Gastrointestinal: Negative for heartburn, abdominal pain, vomiting, diarrhea or consitpation Genitourinary: Negative for dysuria, urgency, frequency, hematuria Musculoskeletal: Negative for back pain or joint pain Neurological: Negative for dizziness, seizures, syncope, focal weakness,  numbness and headaches.  Hematological: Does not bruise/bleed easily.  Psychiatric/Behavioral: Negative for hallucinations, confusion, dysphoric mood    Tolerating Diet: yes      DRUG ALLERGIES:  No Known Allergies  VITALS:  Blood pressure (!) 146/72, pulse 80, temperature 98.3 F (36.8 C), temperature source Oral, resp. rate 17, height 5\' 2"  (1.575 m), weight 90.7 kg (200 lb), SpO2 97 %.  PHYSICAL EXAMINATION:  Constitutional: Appears well-developed and well-nourished. No distress. HENT: Normocephalic. Marland Kitchen Oropharynx is clear and moist.  Eyes: Conjunctivae and EOM are normal. PERRLA, no scleral icterus.  Neck: Normal ROM. Neck supple. No JVD. No tracheal deviation. CVS: RRR, S1/S2 +, no murmurs, no gallops, no carotid bruit.  Pulmonary: Effort and breath sounds normal, no stridor, rhonchi, wheezes, rales.  Abdominal: Soft. BS +,  no distension, tenderness, rebound or guarding.  Musculoskeletal: Normal range of motion. No edema and no tenderness.  Neuro: Alert. CN 2-12 grossly intact. No focal deficits. Skin: Skin is  warm and dry. No rash noted. Psychiatric: Normal mood and affect.      LABORATORY PANEL:   CBC  Recent Labs Lab 03/21/17 0231  WBC 10.8  HGB 12.8  HCT 34.9*  PLT 293   ------------------------------------------------------------------------------------------------------------------  Chemistries   Recent Labs Lab 03/21/17 0231  03/23/17 0436  NA 118*  < > 125*  K 3.1*  < > 3.6  CL 83*  < > 97*  CO2 22  < > 24  GLUCOSE 124*  < > 101*  BUN 14  < > 8  CREATININE 0.80  < > 0.66  CALCIUM 9.3  < > 8.0*  MG 1.5*  < > 1.5*  AST 30  --   --   ALT 23  --   --   ALKPHOS 62  --   --   BILITOT 1.1  --   --   < > = values in this interval not displayed. ------------------------------------------------------------------------------------------------------------------  Cardiac Enzymes  Recent Labs Lab 03/21/17 0231 03/21/17 0527  TROPONINI <0.03 <0.03   ------------------------------------------------------------------------------------------------------------------  RADIOLOGY:  No results found.   ASSESSMENT AND PLAN:   76 year old female with a history of essential hypertension and hyperlipidemia who presented to the emergency room due to lower extremity cramps and found to have hyponatremia.  1. Hypovolemic hyponatremia thought to be due to HCTZ which has been discontinued. IV fluids have improved sodium level She also drinks EtOH was I advised her not to as this can also cause hyponatremia especially if she has a poor diet.  2. Hypo-magnesium: This will be repleted and recheck in a.m.  . 3. hypokalemia: This is been repleted  4.hyperlipidemia: Continue statin  5. Essential hypertension: HCTZ discontinued due to hyponatremia I will add Norvasc in its  place.   6. EtOHon CIWA protocol initiated   Management plans discussed with the patient and she is in agreement.  CODE STATUS: full  TOTAL TIME TAKING CARE OF THIS PATIENT: 27 minutes.     POSSIBLE  D/C tomorrow, DEPENDING ON CLINICAL CONDITION.   Pakou Rainbow M.D on 03/23/2017 at 11:45 AM  Between 7am to 6pm - Pager - [phone] After 6pm go to [email or web address] - password [email or web address]  [facility]  Office  [phone]  CC: Primary care physician; Patient, No Pcp Per  Note: This dictation was prepared with Dragon dictation along with smaller phrase technology. Any transcriptional errors that result from this process are unintentional.

## 2017-03-23 NOTE — Care Management (Signed)
PT consult is pending.  Patient lives alone.  Has no close family but has a friend that provided much support. Was followed by Franco Nonesheryl Lindley "but then she left and I could not find her."  Patient would like to have an appointment made with Franco Nonesheryl Lindley.  CM contacted the office and patient's dual Medicare [facility] is not in network with patient's plan .  Patient is not able to pay out of pocket.  Her insurance card has pcp of [employer] [facility].  Have asked unit secretary to make an appointment with Dr Madelin RearBaboff as patient is going to need hospital follow up.  Patient says she does not have any issues with transportation/.  She has been on CIWA and is scoring 0.  Her sodium which was initially 118 is now up to 125.

## 2017-03-23 NOTE — Plan of Care (Signed)
Problem: Safety: Goal: Ability to remain free from injury will improve Outcome: Progressing Patient's VSS throughout the shift. Patient denies pain. Patient resting well. RN will continue to monitor.    

## 2017-03-23 NOTE — Progress Notes (Signed)
[facility]  ROUNDING NOTE   Subjective:  Patient resting comfortably in bed. Serum sodium up to 125 today. She continues on IV fluid hydration.  Objective:  Vital signs in last 24 hours:  Temp:  [98.3 F (36.8 C)-98.5 F (36.9 C)] 98.3 F (36.8 C) (10/31 0900) Pulse Rate:  [80-100] 80 (10/31 0900) Resp:  [17] 17 (10/31 0900) BP: (135-152)/(57-91) 146/72 (10/31 0900) SpO2:  [97 %-99 %] 97 % (10/31 0900)  Weight change:  Filed Weights   03/21/17 0023  Weight: 90.7 kg (200 lb)    Intake/Output: I/O last 3 completed shifts: In: 4171.3 [P.O.:1440; I.V.:2731.3] Out: 2700 [Urine:2700]   Intake/Output this shift:  Total I/O In: 480 [P.O.:480] Out: 700 [Urine:700]  Physical Exam: General: No acute distress  Head: Normocephalic, atraumatic. Moist oral mucosal membranes  Eyes: Anicteric  Neck: Supple, trachea midline  Lungs:  Clear to auscultation, normal effort  Heart: S1S2 no rubs  Abdomen:  Soft, nontender, bowel sounds present  Extremities: No peripheral edema.  Neurologic: Resting comfortably  Skin: No lesions       Basic Metabolic Panel:  Recent Labs Lab 03/21/17 0231  03/21/17 1425 03/21/17 1840 03/21/17 2236 03/22/17 0231 03/23/17 0436  NA 118*  < > 119* 120* 122* 123* 125*  K 3.1*  < > 3.5 3.5 3.7 3.7 3.6  CL 83*  < > 87* 90* 92* 93* 97*  CO2 22  < > 22 23 22 23 24   GLUCOSE 124*  < > 124* 113* 108* 105* 101*  BUN 14  < > 11 11 11 10 8   CREATININE 0.80  < > 0.63 0.77 0.72 0.58 0.66  CALCIUM 9.3  < > 8.5* 8.4* 8.2* 8.0* 8.0*  MG 1.5*  --   --   --   --  1.7 1.5*  < > = values in this interval not displayed.  Liver Function Tests:  Recent Labs Lab 03/21/17 0231  AST 30  ALT 23  ALKPHOS 62  BILITOT 1.1  PROT 7.4  ALBUMIN 4.1   No results for input(s): LIPASE, AMYLASE in the last 168 hours. No results for input(s): AMMONIA in the last 168 hours.  CBC:  Recent Labs Lab 03/21/17 0231  WBC 10.8  HGB 12.8  HCT 34.9*  MCV  93.0  PLT 293    Cardiac Enzymes:  Recent Labs Lab 03/21/17 0231 03/21/17 0527  TROPONINI <0.03 <0.03    BNP: Invalid input(s): POCBNP  CBG: No results for input(s): GLUCAP in the last 168 hours.  Microbiology: No results found for this or any previous visit.  Coagulation Studies: No results for input(s): LABPROT, INR in the last 72 hours.  Urinalysis:  Recent Labs  03/21/17 0415  COLORURINE YELLOW*  LABSPEC 1.018  PHURINE 5.0  GLUCOSEU NEGATIVE  HGBUR NEGATIVE  BILIRUBINUR NEGATIVE  KETONESUR 5*  PROTEINUR 100*  NITRITE NEGATIVE  LEUKOCYTESUR NEGATIVE      Imaging: No results found.   Medications:   . 0.9 % NaCl with KCl 20 mEq / L 125 mL/hr at 03/23/17 0242   . amLODipine  5 mg Oral Daily  . aspirin EC  81 mg Oral Daily  . enoxaparin (LOVENOX) injection  40 mg Subcutaneous Q24H  . folic acid  1 mg Oral Daily  . multivitamin with minerals  1 tablet Oral Daily  . omega-3 acid ethyl esters  1 g Oral Daily  . pravastatin  20 mg Oral QHS  . thiamine  100 mg Oral Daily  Or  . thiamine  100 mg Intravenous Daily   acetaminophen **OR** acetaminophen, LORazepam **OR** LORazepam, ondansetron **OR** ondansetron (ZOFRAN) IV, senna-docusate  Assessment/ Plan:  76 y.o. female with a PMHx of gout, hyperlipidemia, hypertension, anxiety, who was admitted to [facility] on 03/21/2017 for evaluation of lower extremity cramps.  Patient found to have hyponatremia, hypokalemia, and hypomagnesemia upon admission.  She was noted to be taking lisinopril/HCTZ as an outpatient.  1.  Hyponatremia. 2.  Hypokalemia at admission, improved. 3.  Hypomagnesemia.  Plan:  Serum sodium continues to improve. Currently sodium is up to 125. Serum potassium is also normalized at 3.6.  Magnesium was noted to be low again at 1.5 however the patient has been administered magnesium sulfate 2 g IV. Recommend continued monitoring of serum electrolytes and continue IV fluid hydration with 0.9  normal saline.   LOS: 2 Cayne Yom 10/31/20182:19 PM

## 2017-03-24 LAB — BASIC METABOLIC PANEL
Anion gap: 5 (ref 5–15)
BUN: 10 mg/dL (ref 6–20)
CALCIUM: 7.7 mg/dL — AB (ref 8.9–10.3)
CO2: 26 mmol/L (ref 22–32)
CREATININE: 0.56 mg/dL (ref 0.44–1.00)
Chloride: 96 mmol/L — ABNORMAL LOW (ref 101–111)
GFR calc Af Amer: >60 mL/min (ref >60)
Glucose, Bld: 122 mg/dL — ABNORMAL HIGH (ref 65–99)
POTASSIUM: 3.4 mmol/L — AB (ref 3.5–5.1)
SODIUM: 127 mmol/L — AB (ref 135–145)

## 2017-03-24 MED ORDER — POTASSIUM CHLORIDE 10 MEQ/100ML IV SOLN
10.0000 meq | INTRAVENOUS | Status: AC
  Administered 2017-03-24 (×4): 10 meq via INTRAVENOUS
  Filled 2017-03-24 (×4): qty 100

## 2017-03-24 NOTE — Evaluation (Signed)
Physical Therapy Evaluation Patient Details Name: Angela Sullivan MRN: [id number] DOB: 08-Jun-1940 Today's Date: 03/24/2017   History of Present Illness   76 y.o. female with a known history of anxiety disorder, gout, hyperlipidemia, hypertension presented with leg cramps. She was evaluated in the emergency room her sodium, potassium and magnesium levels were low. Sodium was around 118.  Lab values have improved over hopsital course  Clinical Impression  Pt showed good confidence with mobility and to a lesser degree ambulation but overall did well.  She did not have excessive fatigue and was able to do prolonged ambulation/steps but reports not being at her baseline.  She did have a mild limp on L secondary to some anterior hip pain (signficant bruising on L thigh) but despite this showed she would be safe to go home.  Overall pt did well and feels she should be able to safely go home, reports she has friends that could help with prolonged errands, etc until she is feeling fully back to 100%.     Follow Up Recommendations Home health PT    Equipment Recommendations       Recommendations for Other Services       Precautions / Restrictions Precautions Precautions: Fall Restrictions Weight Bearing Restrictions: No      Mobility  Bed Mobility Overal bed mobility: Independent                Transfers Overall transfer level: Independent Equipment used: None             General transfer comment: Pt initially grabbing for counter top ("Just a habit"), but then able to maintain balance w/o UEs w/o issue  Ambulation/Gait Ambulation/Gait assistance: Modified independent (Device/Increase time) Ambulation Distance (Feet): 150 Feet Assistive device: None       General Gait Details: Pt with slow but safe and confident cadence.  She did not have excessive fatigue or overt safety issues.  She reports she is not feeling that she is fully back to her baseline but feels strong enough  to go home  Stairs Stairs: Yes Stairs assistance: Supervision Stair Management: No rails (did use wall to lean on attempted to simulate home situation) Number of Stairs: 4 General stair comments: Pt able to lead with R LE and step-to with L going up, opposite going down  Wheelchair Mobility    Modified Rankin (Stroke Patients Only)       Balance Overall balance assessment: Modified Independent                                           Pertinent Vitals/Pain Pain Assessment:  (mild L anterior hip pain)    Home Living Family/patient expects to be discharged to:: Private residence Living Arrangements: Alone Available Help at Discharge: Friend(s) Type of Home: House Home Access: Stairs to enter Entrance Stairs-Rails: None Entrance Stairs-Number of Steps: 3          Prior Function Level of Independence: Independent         Comments: Pt able to drive and run all her errands, etc     Hand Dominance        Extremity/Trunk Assessment   Upper Extremity Assessment Upper Extremity Assessment: Overall WFL for tasks assessed    Lower Extremity Assessment Lower Extremity Assessment: Overall WFL for tasks assessed       Communication   Communication: No difficulties  Cognition  Arousal/Alertness: Awake/alert Behavior During Therapy: WFL for tasks assessed/performed Overall Cognitive Status: Within Functional Limits for tasks assessed                                        General Comments      Exercises     Assessment/Plan    PT Assessment Patient needs continued PT services  PT Problem List Decreased strength;Decreased range of motion;Decreased activity tolerance;Decreased balance;Decreased mobility;Decreased coordination       PT Treatment Interventions DME instruction;Gait training;Stair training;Functional mobility training;Therapeutic activities;Therapeutic exercise;Balance training;Neuromuscular  re-education;Patient/family education    PT Goals (Current goals can be found in the Care Plan section)  Acute Rehab PT Goals Patient Stated Goal: go home PT Goal Formulation: With patient Time For Goal Achievement: 04/07/17 Potential to Achieve Goals: Good    Frequency Min 2X/week   Barriers to discharge        Co-evaluation               AM-PAC PT "6 Clicks" Daily Activity  Outcome Measure Difficulty turning over in bed (including adjusting bedclothes, sheets and blankets)?: None Difficulty moving from lying on back to sitting on the side of the bed? : None Difficulty sitting down on and standing up from a chair with arms (e.g., wheelchair, bedside commode, etc,.)?: None Help needed moving to and from a bed to chair (including a wheelchair)?: None Help needed walking in hospital room?: A Little Help needed climbing 3-5 steps with a railing? : A Little 6 Click Score: 22    End of Session Equipment Utilized During Treatment: Gait belt Activity Tolerance: Patient tolerated treatment well Patient left: with chair alarm set;with call bell/phone within reach Nurse Communication: Mobility status PT Visit Diagnosis: Muscle weakness (generalized) (M62.81);Difficulty in walking, not elsewhere classified (R26.2)    Time: 0102-7253 PT Time Calculation (min) (ACUTE ONLY): 26 min   Charges:   PT Evaluation $PT Eval Low Complexity: 1 Low     PT G Codes:   PT G-Codes **NOT FOR INPATIENT CLASS** Functional Assessment Tool Used: AM-PAC 6 Clicks Basic Mobility Functional Limitation: Mobility: Walking and moving around Mobility: Walking and Moving Around Current Status (G6440): At least 20 percent but less than 40 percent impaired, limited or restricted Mobility: Walking and Moving Around Goal Status [phone]): 0 percent impaired, limited or restricted    Malachi Pro, DPT 03/24/2017, 12:57 PM

## 2017-03-24 NOTE — Care Management (Signed)
[facility].  Informed that all providers in the clinic have to sign off on all new patients and will follow up with CM.  CM asked that all efforts be made to complete this process today.

## 2017-03-24 NOTE — Progress Notes (Signed)
[facility]  ROUNDING NOTE   Subjective:  Patient much more awake and alert today. Serum sodium continues to rise slowly. Serum sodium currently 127. Urine output was 2.6 L over the preceding 24 hours.  Objective:  Vital signs in last 24 hours:  Temp:  [97.9 F (36.6 C)-98.3 F (36.8 C)] 97.9 F (36.6 C) (11/01 0344) Pulse Rate:  [84-91] 84 (11/01 1241) Resp:  [18] 18 (11/01 0928) BP: (149-165)/(56-68) 165/59 (11/01 1241) SpO2:  [97 %-100 %] 98 % (11/01 0928)  Weight change:  Filed Weights   03/21/17 0023  Weight: 90.7 kg (200 lb)    Intake/Output: I/O last 3 completed shifts: In: 4526.7 [P.O.:960; I.V.:3566.7] Out: 3500 [Urine:3500]   Intake/Output this shift:  Total I/O In: 240 [P.O.:240] Out: -   Physical Exam: General: No acute distress  Head: Normocephalic, atraumatic. Moist oral mucosal membranes  Eyes: Anicteric  Neck: Supple, trachea midline  Lungs:  Clear to auscultation, normal effort  Heart: S1S2 no rubs  Abdomen:  Soft, nontender, bowel sounds present  Extremities: No peripheral edema.  Neurologic: Awake, alert, following commands  Skin: No lesions       Basic Metabolic Panel:  Recent Labs Lab 03/21/17 0231  03/21/17 1840 03/21/17 2236 03/22/17 0231 03/23/17 0436 03/24/17 0429  NA 118*  < > 120* 122* 123* 125* 127*  K 3.1*  < > 3.5 3.7 3.7 3.6 3.4*  CL 83*  < > 90* 92* 93* 97* 96*  CO2 22  < > 23 22 23 24 26   GLUCOSE 124*  < > 113* 108* 105* 101* 122*  BUN 14  < > 11 11 10 8 10   CREATININE 0.80  < > 0.77 0.72 0.58 0.66 0.56  CALCIUM 9.3  < > 8.4* 8.2* 8.0* 8.0* 7.7*  MG 1.5*  --   --   --  1.7 1.5*  --   < > = values in this interval not displayed.  Liver Function Tests:  Recent Labs Lab 03/21/17 0231  AST 30  ALT 23  ALKPHOS 62  BILITOT 1.1  PROT 7.4  ALBUMIN 4.1   No results for input(s): LIPASE, AMYLASE in the last 168 hours. No results for input(s): AMMONIA in the last 168 hours.  CBC:  Recent  Labs Lab 03/21/17 0231  WBC 10.8  HGB 12.8  HCT 34.9*  MCV 93.0  PLT 293    Cardiac Enzymes:  Recent Labs Lab 03/21/17 0231 03/21/17 0527  TROPONINI <0.03 <0.03    BNP: Invalid input(s): POCBNP  CBG: No results for input(s): GLUCAP in the last 168 hours.  Microbiology: No results found for this or any previous visit.  Coagulation Studies: No results for input(s): LABPROT, INR in the last 72 hours.  Urinalysis: No results for input(s): COLORURINE, LABSPEC, PHURINE, GLUCOSEU, HGBUR, BILIRUBINUR, KETONESUR, PROTEINUR, UROBILINOGEN, NITRITE, LEUKOCYTESUR in the last 72 hours.  Invalid input(s): APPERANCEUR    Imaging: No results found.   Medications:   . 0.9 % NaCl with KCl 20 mEq / L 125 mL/hr at 03/24/17 0935   . amLODipine  5 mg Oral Daily  . aspirin EC  81 mg Oral Daily  . enoxaparin (LOVENOX) injection  40 mg Subcutaneous Q24H  . folic acid  1 mg Oral Daily  . multivitamin with minerals  1 tablet Oral Daily  . omega-3 acid ethyl esters  1 g Oral Daily  . pravastatin  20 mg Oral QHS  . thiamine  100 mg Oral Daily  Or  . thiamine  100 mg Intravenous Daily   acetaminophen **OR** acetaminophen, LORazepam **OR** LORazepam, ondansetron **OR** ondansetron (ZOFRAN) IV, senna-docusate  Assessment/ Plan:  76 y.o. female with a PMHx of gout, hyperlipidemia, hypertension, anxiety, who [facility] for evaluation of lower extremity cramps.  Patient found to have hyponatremia, hypokalemia, and hypomagnesemia upon admission.  She was noted to be taking lisinopril/HCTZ as an outpatient.  1.  Hyponatremia. 2.  Hypokalemia at admission, improved. 3.  Hypomagnesemia.  Plan:  Patient appears to be doing better.  She was much more awake and alert today.  Serum sodium currently 127.  Potassium again slightly low at 3.4. We will administer potassium chloride 40 mEq IV x1 today.  Hopefully her serum sodium will be closer to 130 tomorrow. Physical  therapy evaluated the patient and have recommended home health physical therapy.  We will continue to progress along with you.  LOS: 3 Nazaria Riesen 11/1/20183:51 PM

## 2017-03-24 NOTE — Progress Notes (Signed)
[facility] [place] [facility]   PATIENT NAME: Angela Sullivan    MR#:  [id number]  DATE OF BIRTH:  1941-02-28  SUBJECTIVE:   No acute events overnight. Patient is doing well this morning. REVIEW OF SYSTEMS:    Review of Systems  Constitutional: Negative for fever, chills weight loss HENT: Negative for ear pain, nosebleeds, congestion, facial swelling, rhinorrhea, neck pain, neck stiffness and ear discharge.   Respiratory: Negative for cough, shortness of breath, wheezing  Cardiovascular: Negative for chest pain, palpitations and leg swelling.  Gastrointestinal: Negative for heartburn, abdominal pain, vomiting, diarrhea or consitpation Genitourinary: Negative for dysuria, urgency, frequency, hematuria Musculoskeletal: Negative for back pain or joint pain Neurological: Negative for dizziness, seizures, syncope, focal weakness,  numbness and headaches.  Hematological: Does not bruise/bleed easily.  Psychiatric/Behavioral: Negative for hallucinations, confusion, dysphoric mood    Tolerating Diet: yes      DRUG ALLERGIES:  No Known Allergies  VITALS:  Blood pressure (!) 159/68, pulse 88, temperature 97.9 F (36.6 C), temperature source Oral, resp. rate 18, height 5\' 2"  (1.575 m), weight 90.7 kg (200 lb), SpO2 98 %.  PHYSICAL EXAMINATION:  Constitutional: Appears well-developed and well-nourished. No distress. HENT: Normocephalic. Marland Kitchen. Oropharynx is clear and moist.  Eyes: Conjunctivae and EOM are normal. PERRLA, no scleral icterus.  Neck: Normal ROM. Neck supple. No JVD. No tracheal deviation. CVS: RRR, S1/S2 +, no murmurs, no gallops, no carotid bruit.  Pulmonary: Effort and breath sounds normal, no stridor, rhonchi, wheezes, rales.  Abdominal: Soft. BS +,  no distension, tenderness, rebound or guarding.  Musculoskeletal: Normal range of motion. No edema and no tenderness.  Neuro: Alert. CN 2-12 grossly intact. No focal deficits. Skin: Large bruise noted  on the left thigh  Psychiatric: Normal mood and affect.      LABORATORY PANEL:   CBC  Recent Labs Lab 03/21/17 0231  WBC 10.8  HGB 12.8  HCT 34.9*  PLT 293   ------------------------------------------------------------------------------------------------------------------  Chemistries   Recent Labs Lab 03/21/17 0231  03/23/17 0436 03/24/17 0429  NA 118*  < > 125* 127*  K 3.1*  < > 3.6 3.4*  CL 83*  < > 97* 96*  CO2 22  < > 24 26  GLUCOSE 124*  < > 101* 122*  BUN 14  < > 8 10  CREATININE 0.80  < > 0.66 0.56  CALCIUM 9.3  < > 8.0* 7.7*  MG 1.5*  < > 1.5*  --   AST 30  --   --   --   ALT 23  --   --   --   ALKPHOS 62  --   --   --   BILITOT 1.1  --   --   --   < > = values in this interval not displayed. ------------------------------------------------------------------------------------------------------------------  Cardiac Enzymes  Recent Labs Lab 03/21/17 0231 03/21/17 0527  TROPONINI <0.03 <0.03   ------------------------------------------------------------------------------------------------------------------  RADIOLOGY:  No results found.   ASSESSMENT AND PLAN:   76 year old female with a history of essential hypertension and hyperlipidemia who presented to the emergency room due to lower extremity cramps and found to have hyponatremia.  1. Hypovolemic hyponatremia thought to be due to HCTZ which has been discontinued. IV fluids have improved sodium level She also drinks EtOH was I advised her not to as this can also cause hyponatremia   2. Hypomagnesium: This has been repleted. 3. hypokalemia: This is been repleted  4.hyperlipidemia: Continue statin  5. Essential  hypertension: HCTZ discontinued due to hyponatremia I will add Norvasc in its place.   6. EtOHon CIWA protocol initiated  7. Bruise on left thigh: This is improving  Management plans discussed with the patient and she is in agreement.  CODE STATUS: full  TOTAL TIME TAKING  CARE OF THIS PATIENT: 24 minutes.   D/w dr Cherylann Ratel  POSSIBLE D/C tomorrow with [facility], DEPENDING ON CLINICAL CONDITION.   Nikko Goldwire M.D on 03/24/2017 at 10:32 AM  Between 7am to 6pm - Pager - [phone] After 6pm go to [email or web address] - password [email or web address]  [facility]  Office  [phone]  CC: Primary care physician; Patient, No Pcp Per  Note: This dictation was prepared with Dragon dictation along with smaller phrase technology. Any transcriptional errors that result from this process are unintentional.

## 2017-03-25 LAB — BASIC METABOLIC PANEL
ANION GAP: 7 (ref 5–15)
ANION GAP: 9 (ref 5–15)
BUN: 8 mg/dL (ref 6–20)
BUN: 8 mg/dL (ref 6–20)
CALCIUM: 8.3 mg/dL — AB (ref 8.9–10.3)
CHLORIDE: 96 mmol/L — AB (ref 101–111)
CHLORIDE: 90 mmol/L — AB (ref 101–111)
CO2: 23 mmol/L (ref 22–32)
CO2: 23 mmol/L (ref 22–32)
CREATININE: 0.46 mg/dL (ref 0.44–1.00)
Calcium: 8.7 mg/dL — ABNORMAL LOW (ref 8.9–10.3)
Creatinine, Ser: 0.54 mg/dL (ref 0.44–1.00)
GFR calc non Af Amer: >60 mL/min (ref >60)
GFR calc non Af Amer: >60 mL/min (ref >60)
Glucose, Bld: 134 mg/dL — ABNORMAL HIGH (ref 65–99)
Glucose, Bld: 104 mg/dL — ABNORMAL HIGH (ref 65–99)
POTASSIUM: 3.8 mmol/L (ref 3.5–5.1)
POTASSIUM: 3.7 mmol/L (ref 3.5–5.1)
SODIUM: 122 mmol/L — AB (ref 135–145)
Sodium: 126 mmol/L — ABNORMAL LOW (ref 135–145)

## 2017-03-25 LAB — URIC ACID: Uric Acid, Serum: 2.3 mg/dL (ref 2.3–6.6)

## 2017-03-25 MED ORDER — TOLVAPTAN 15 MG PO TABS
15.0000 mg | ORAL_TABLET | ORAL | Status: DC
  Administered 2017-03-25: 15 mg via ORAL
  Filled 2017-03-25 (×2): qty 1

## 2017-03-25 NOTE — Progress Notes (Signed)
[facility]  ROUNDING NOTE   Subjective:  Serum sodium has dropped a bit today to 122. Tolvaptan has been ordered 15 mg by mouth 1.   Objective:  Vital signs in last 24 hours:  Temp:  [98.1 F (36.7 C)-98.9 F (37.2 C)] 98.1 F (36.7 C) (11/02 1001) Pulse Rate:  [89-104] 89 (11/02 1642) Resp:  [18] 18 (11/02 1001) BP: (145-176)/(53-68) 176/68 (11/02 1642) SpO2:  [97 %-100 %] 97 % (11/02 1642)  Weight change:  Filed Weights   03/21/17 0023  Weight: 90.7 kg (200 lb)    Intake/Output: I/O last 3 completed shifts: In: 3116.7 [P.O.:600; I.V.:2316.7; IV Piggyback:200] Out: 2900 [Urine:2900]   Intake/Output this shift:  Total I/O In: 600 [P.O.:600] Out: 901 [Urine:900; Stool:1]  Physical Exam: General: No acute distress  Head: Normocephalic, atraumatic. Moist oral mucosal membranes  Eyes: Anicteric  Neck: Supple, trachea midline  Lungs:  Clear to auscultation, normal effort  Heart: S1S2 no rubs  Abdomen:  Soft, nontender, bowel sounds present  Extremities: No peripheral edema.  Neurologic: Awake, alert, following commands  Skin: No lesions       Basic Metabolic Panel:  Recent Labs Lab 03/21/17 0231  03/22/17 0231 03/23/17 0436 03/24/17 0429 03/25/17 1005 03/25/17 1719  NA 118*  < > 123* 125* 127* 126* 122*  K 3.1*  < > 3.7 3.6 3.4* 3.8 3.7  CL 83*  < > 93* 97* 96* 96* 90*  CO2 22  < > 23 24 26 23 23   GLUCOSE 124*  < > 105* 101* 122* 134* 104*  BUN 14  < > 10 8 10 8 8   CREATININE 0.80  < > 0.58 0.66 0.56 0.54 0.46  CALCIUM 9.3  < > 8.0* 8.0* 7.7* 8.3* 8.7*  MG 1.5*  --  1.7 1.5*  --   --   --   < > = values in this interval not displayed.  Liver Function Tests:  Recent Labs Lab 03/21/17 0231  AST 30  ALT 23  ALKPHOS 62  BILITOT 1.1  PROT 7.4  ALBUMIN 4.1   No results for input(s): LIPASE, AMYLASE in the last 168 hours. No results for input(s): AMMONIA in the last 168 hours.  CBC:  Recent Labs Lab 03/21/17 0231  WBC 10.8   HGB 12.8  HCT 34.9*  MCV 93.0  PLT 293    Cardiac Enzymes:  Recent Labs Lab 03/21/17 0231 03/21/17 0527  TROPONINI <0.03 <0.03    BNP: Invalid input(s): POCBNP  CBG: No results for input(s): GLUCAP in the last 168 hours.  Microbiology: No results found for this or any previous visit.  Coagulation Studies: No results for input(s): LABPROT, INR in the last 72 hours.  Urinalysis: No results for input(s): COLORURINE, LABSPEC, PHURINE, GLUCOSEU, HGBUR, BILIRUBINUR, KETONESUR, PROTEINUR, UROBILINOGEN, NITRITE, LEUKOCYTESUR in the last 72 hours.  Invalid input(s): APPERANCEUR    Imaging: No results found.   Medications:    . amLODipine  5 mg Oral Daily  . aspirin EC  81 mg Oral Daily  . enoxaparin (LOVENOX) injection  40 mg Subcutaneous Q24H  . folic acid  1 mg Oral Daily  . multivitamin with minerals  1 tablet Oral Daily  . omega-3 acid ethyl esters  1 g Oral Daily  . pravastatin  20 mg Oral QHS  . thiamine  100 mg Oral Daily   Or  . thiamine  100 mg Intravenous Daily  . tolvaptan  15 mg Oral Q24H   acetaminophen **OR**  acetaminophen, LORazepam **OR** LORazepam, ondansetron **OR** ondansetron (ZOFRAN) IV, senna-docusate  Assessment/ Plan:  76 y.o. female with a PMHx of gout, hyperlipidemia, hypertension, anxiety, who was [facility] for evaluation of lower extremity cramps.  Patient found to have hyponatremia, hypokalemia, and hypomagnesemia upon admission.  She was noted to be taking lisinopril/HCTZ as an outpatient.  1.  Hyponatremia. 2.  Hypokalemia at admission, improved. 3.  Hypomagnesemia.  Plan:  Serum sodium has dropped again today. Sodium currently 122. 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  We have started the patient on tolvaptan 15 mg by mouth daily. Follow serum sodium closely. We will continue to monitor her progress along closely with you.  LOS: 4 Kaylan Friedmann 11/2/20185:55 PM

## 2017-03-25 NOTE — Care Management Important Message (Signed)
Important Message  Patient Details  Name: Angela Sullivan MRN: [id number] Date of Birth: Aug 29, 1940   Medicare Important Message Given:  Yes  Signed IM notice given  Eber HongGreene, Jomo Forand R, RN 03/25/2017, 3:02 PM

## 2017-03-25 NOTE — Care Management (Addendum)
CM contacted Dr Pilar PlateBabaoff's office to follow up on new patient appointment.  Per Burnetta SabinIvey, Dr Larwance SachsBabaoff has not "signed off" on this new referral yet.  Burnetta Sabinvey will make every attempt to have this resolved today.  Per medicare uhc -  the patient has to be the one to change the pcp with her insurance plan. This would delay network benefits so will follow thru with Dr Larwance SachsBabaoff as a PCP and patient can change this later if she wishes.  Patient will not be able to receive home health services without a pcp. Physical therapy has recommend home health PT.    Heads up referral to [facility] for [facility] keeping in mind, patient would have to have been seen by PCP before home visit can be made.  Patient informed

## 2017-03-25 NOTE — Progress Notes (Signed)
[facility] - [place] [facility]   PATIENT NAME: Angela Sullivan    MR#:  [id number]  DATE OF BIRTH:  1940/11/01  SUBJECTIVE:   Patient wants to go home but sodium is low this am REVIEW OF SYSTEMS:    Review of Systems  Constitutional: Negative for fever, chills weight loss HENT: Negative for ear pain, nosebleeds, congestion, facial swelling, rhinorrhea, neck pain, neck stiffness and ear discharge.   Respiratory: Negative for cough, shortness of breath, wheezing  Cardiovascular: Negative for chest pain, palpitations and leg swelling.  Gastrointestinal: Negative for heartburn, abdominal pain, vomiting, diarrhea or consitpation Genitourinary: Negative for dysuria, urgency, frequency, hematuria Musculoskeletal: Negative for back pain or joint pain Neurological: Negative for dizziness, seizures, syncope, focal weakness,  numbness and headaches.  Hematological: Does not bruise/bleed easily.  Psychiatric/Behavioral: Negative for hallucinations, confusion, dysphoric mood    Tolerating Diet: yes      DRUG ALLERGIES:  No Known Allergies  VITALS:  Blood pressure (!) 145/53, pulse (!) 104, temperature 98.1 F (36.7 C), temperature source Oral, resp. rate 18, height 5\' 2"  (1.575 m), weight 90.7 kg (200 lb), SpO2 100 %.  PHYSICAL EXAMINATION:  Constitutional: Appears well-developed and well-nourished. No distress. HENT: Normocephalic. Marland Kitchen. Oropharynx is clear and moist.  Eyes: Conjunctivae and EOM are normal. PERRLA, no scleral icterus.  Neck: Normal ROM. Neck supple. No JVD. No tracheal deviation. CVS: RRR, S1/S2 +, no murmurs, no gallops, no carotid bruit.  Pulmonary: Effort and breath sounds normal, no stridor, rhonchi, wheezes, rales.  Abdominal: Soft. BS +,  no distension, tenderness, rebound or guarding.  Musculoskeletal: Normal range of motion. No edema and no tenderness.  Neuro: Alert. CN 2-12 grossly intact. No focal deficits. Skin: Large bruise noted on  the left thigh  Psychiatric: Normal mood and affect.      LABORATORY PANEL:   CBC  Recent Labs Lab 03/21/17 0231  WBC 10.8  HGB 12.8  HCT 34.9*  PLT 293   ------------------------------------------------------------------------------------------------------------------  Chemistries   Recent Labs Lab 03/21/17 0231  03/23/17 0436  03/25/17 1005  NA 118*  < > 125*  < > 126*  K 3.1*  < > 3.6  < > 3.8  CL 83*  < > 97*  < > 96*  CO2 22  < > 24  < > 23  GLUCOSE 124*  < > 101*  < > 134*  BUN 14  < > 8  < > 8  CREATININE 0.80  < > 0.66  < > 0.54  CALCIUM 9.3  < > 8.0*  < > 8.3*  MG 1.5*  < > 1.5*  --   --   AST 30  --   --   --   --   ALT 23  --   --   --   --   ALKPHOS 62  --   --   --   --   BILITOT 1.1  --   --   --   --   < > = values in this interval not displayed. ------------------------------------------------------------------------------------------------------------------  Cardiac Enzymes  Recent Labs Lab 03/21/17 0231 03/21/17 0527  TROPONINI <0.03 <0.03   ------------------------------------------------------------------------------------------------------------------  RADIOLOGY:  No results found.   ASSESSMENT AND PLAN:   76 year old female with a history of essential hypertension and hyperlipidemia who presented to the emergency room due to lower extremity cramps and found to have hyponatremia.  1. Hypovolemic hyponatremia thought to be due to HCTZ which has been discontinued.  Sodium level dropped this am to 126 on IVF. Will d/w Dr Cherylann Ratel, may need to stop IVF Order uric acid for eval SIADH She also drinks EtOH was I advised her not to as this can also cause hyponatremia   2. Hypomagnesium: This has been repleted. 3. hypokalemia: This is been repleted  4.hyperlipidemia: Continue statin  5. Essential hypertension: HCTZ discontinued due to hyponatremia Continue Norvasc in its place.   6. EtOH: on CIWA protocol initiated  7. Bruise on  left thigh: This is improving slowly  Management plans discussed with the patient and she is in agreement.  CODE STATUS: full  TOTAL TIME TAKING CARE OF THIS PATIENT: 24 minutes.   Called Dr Cherylann Ratel  POSSIBLE D/C tomorrow with [facility], DEPENDING ON CLINICAL CONDITION.   Nadea Kirkland M.D on 03/25/2017 at 11:15 AM  Between 7am to 6pm - Pager - [phone] After 6pm go to [email or web address] - password [email or web address]  [facility] [place] [facility]  Office  [phone]  CC: Primary care physician; Kandyce Rud, MD  Note: This dictation was prepared with Dragon dictation along with smaller phrase technology. Any transcriptional errors that result from this process are unintentional.

## 2017-03-26 LAB — BASIC METABOLIC PANEL
Anion gap: 9 (ref 5–15)
BUN: 9 mg/dL (ref 6–20)
CHLORIDE: 98 mmol/L — AB (ref 101–111)
CO2: 24 mmol/L (ref 22–32)
CREATININE: 0.81 mg/dL (ref 0.44–1.00)
Calcium: 9.3 mg/dL (ref 8.9–10.3)
GFR calc Af Amer: >60 mL/min (ref >60)
GFR calc non Af Amer: >60 mL/min (ref >60)
GLUCOSE: 116 mg/dL — AB (ref 65–99)
POTASSIUM: 4 mmol/L (ref 3.5–5.1)
SODIUM: 131 mmol/L — AB (ref 135–145)

## 2017-03-26 LAB — SODIUM: SODIUM: 131 mmol/L — AB (ref 135–145)

## 2017-03-26 MED ORDER — LISINOPRIL 40 MG PO TABS: 40.0000 mg | ORAL_TABLET | Freq: Every day | ORAL | 1 refills | Status: AC

## 2017-03-26 MED ORDER — SODIUM CHLORIDE 0.9% FLUSH
3.0000 mL | Freq: Two times a day (BID) | INTRAVENOUS | Status: DC
  Administered 2017-03-26: 3 mL via INTRAVENOUS

## 2017-03-26 NOTE — Progress Notes (Signed)
Patient states she is currently without a PPC.  Her MD moved.  She is going to return to a former provider who moved further away.  She will go with a friend who also sees this MD.

## 2017-03-26 NOTE — Care Management Note (Addendum)
Case Management Note  Patient Details  Name: Erma Heritageolly Delancy MRN: [id number] Date of Birth: 06/24/1940  Subjective/Objective:        [facility] is aware of this HH=RN referral that is not actionable until Ms Effie ShyColeman secures a PCP. She will follow up with Dr Larwance SachsBabaoff next week for an appointment to be seen by him. Ms Effie ShyColeman has Dr Pilar PlateBabaoff's phone number and office address, and it is also in her hospital discharge paperwork.             Action/Plan:   Expected Discharge Date:  03/26/17               Expected Discharge Plan:     In-House Referral:     Discharge planning Services     Post Acute Care Choice:    Choice offered to:     DME Arranged:    DME Agency:     HH Arranged:    HH Agency:     Status of Service:     If discussed at [employer] Meetings, dates discussed:    Additional Comments:  Kalliope Riesen A, RN 03/26/2017, 10:47 AM

## 2017-03-26 NOTE — Progress Notes (Signed)
PT Cancellation Note  Patient Details Name: Angela Sullivan MRN: [id number] DOB: [birth year]   Cancelled Treatment:    Reason Eval/Treat Not Completed: Other (comment)   Checked pt prior to discharge.  She stated she was getting up/down to bathroom without trouble and felt confident in her mobility.  Awaiting discharge.  Declined session at this time.   Danielle DessSarah Aleysha Meckler 03/26/2017, 12:02 PM

## 2017-03-26 NOTE — Plan of Care (Signed)
Problem: Health Behavior/Discharge Planning: Goal: Ability to manage health-related needs will improve Outcome: Adequate for Discharge Has friends who assist with her care and MD visits

## 2017-03-28 NOTE — Discharge Summary (Signed)
[facility] - [place] at [facility]   PATIENT NAME: Angela Sullivan    MR#:  [id number]  DATE OF BIRTH:  18-May-1941  DATE OF ADMISSION:  03/21/2017 ADMITTING PHYSICIAN: Ihor AustinPavan Pyreddy, MD  DATE OF DISCHARGE: 03/26/2017 12:15 PM  PRIMARY CARE PHYSICIAN: Kandyce RudBabaoff, Marcus, MD    ADMISSION DIAGNOSIS:  Hypokalemia [E87.6] Hypomagnesemia [E83.42] Hyponatremia [E87.1] Leg cramps [R25.2] Atypical chest pain [R07.89]  DISCHARGE DIAGNOSIS:  Active Problems:   Hyponatremia   SECONDARY DIAGNOSIS:   Past Medical History:  Diagnosis Date  . Anxiety   . Gout   . High cholesterol   . Hypertension     HOSPITAL COURSE:   76 year old female with past medical history of hypertension, hyperlipidemia, anxiety, gout who presented to the hospital due to cramps in her legs and noted to have hyponatremia along with hypokalemia.  1. Hyponatremia-this was secondary to volume loss from patient being on diuretics as hydrochlorothiazide also combined with some mild SIADH. Patient was given some IV fluids but despite that the sodium did not improve. Patient was seen by nephrology and given 1 dose of Tolvaptan.  - after 1 dose of Tolvaptan pt's sodium improved and is stable at 132 presently.  She is clinically asymptomatic and therefore now being discharged home. Patient has been taken off the hydrochlorothiazide.  2. Hypokalemia-this has been supplemented since then resolved. Patient's magnesium level was also low which is also been supplemented.  2. Essential hypertension-patient was taken off the hydrochlorothiazide due to the hyponatremia. She will resume her lisinopril.  4. Hyperlipidemia-patient will continue her Pravachol.  5. Osteoarthritis-patient will continue meloxicam.  DISCHARGE CONDITIONS:   Stable  CONSULTS OBTAINED:    DRUG ALLERGIES:  No Known Allergies  DISCHARGE MEDICATIONS:   Allergies as of 03/26/2017   No Known Allergies     Medication List    STOP  taking these medications   lisinopril-hydrochlorothiazide 20-12.5 MG tablet Commonly known as:  PRINZIDE,ZESTORETIC     TAKE these medications   Cholecalciferol 1000 units tablet Take 5,000 Units by mouth daily.   indomethacin 25 MG capsule Commonly known as:  INDOCIN Take 25 mg by mouth every 6 (six) hours as needed.   lisinopril 40 MG tablet Commonly known as:  PRINIVIL,ZESTRIL Take 1 tablet (40 mg total) by mouth daily.   Melatonin 5 MG Tabs Take 2 tablets by mouth at bedtime.   meloxicam 7.5 MG tablet Commonly known as:  MOBIC Take 7.5 mg by mouth 2 (two) times daily.   omega-3 acid ethyl esters 1 g capsule Commonly known as:  LOVAZA Take 1 g by mouth daily.   pravastatin 20 MG tablet Commonly known as:  PRAVACHOL Take 20 mg by mouth at bedtime.         DISCHARGE INSTRUCTIONS:   DIET:  Cardiac diet  DISCHARGE CONDITION:  Stable  ACTIVITY:  Activity as tolerated  OXYGEN:  Home Oxygen: No.   Oxygen Delivery: room air  DISCHARGE LOCATION:  home   If you experience worsening of your admission symptoms, develop shortness of breath, life threatening emergency, suicidal or homicidal thoughts you must seek medical attention immediately by calling 911 or calling your MD immediately  if symptoms less severe.  You Must read complete instructions/literature along with all the possible adverse reactions/side effects for all the Medicines you take and that have been prescribed to you. Take any new Medicines after you have completely understood and accpet all the possible adverse reactions/side effects.   Please note  You were  cared for by a hospitalist during your hospital stay. If you have any questions about your discharge medications or the care you received while you were in the hospital after you are discharged, you can call the unit and asked to speak with the hospitalist on call if the hospitalist that took care of you is not available. Once you are  discharged, your primary care physician will handle any further medical issues. Please note that NO REFILLS for any discharge medications will be authorized once you are discharged, as it is imperative that you return to your primary care physician (or establish a relationship with a primary care physician if you do not have one) for your aftercare needs so that they can reassess your need for medications and monitor your lab values.     Today   Sodium level much improved, clinically asymptomatic. Wants to go home.  VITAL SIGNS:  Blood pressure (!) 160/49, pulse 87, temperature (!) 97.5 F (36.4 C), temperature source Oral, resp. rate 18, height 5\' 2"  (1.575 m), weight 90.7 kg (200 lb), SpO2 97 %.  I/O:  No intake or output data in the 24 hours ending 03/28/17 1555  PHYSICAL EXAMINATION:  GENERAL:  76 y.o.-year-old patient lying in the bed with no acute distress.  EYES: Pupils equal, round, reactive to light and accommodation. No scleral icterus. Extraocular muscles intact.  HEENT: Head atraumatic, normocephalic. Oropharynx and nasopharynx clear.  NECK:  Supple, no jugular venous distention. No thyroid enlargement, no tenderness.  LUNGS: Normal breath sounds bilaterally, no wheezing, rales,rhonchi. No use of accessory muscles of respiration.  CARDIOVASCULAR: S1, S2 normal. No murmurs, rubs, or gallops.  ABDOMEN: Soft, non-tender, non-distended. Bowel sounds present. No organomegaly or mass.  EXTREMITIES: No pedal edema, cyanosis, or clubbing.  NEUROLOGIC: Cranial nerves II through XII are intact. No focal motor or sensory defecits b/l.  PSYCHIATRIC: The patient is alert and oriented x 3.   SKIN: No obvious rash, lesion, or ulcer.   DATA REVIEW:   CBC No results for input(s): WBC, HGB, HCT, PLT in the last 168 hours.  Chemistries  Recent Labs  Lab 03/23/17 0436  03/26/17 0024 03/26/17 0744  NA 125*   < > 131* 131*  K 3.6   < > 4.0  --   CL 97*   < > 98*  --   CO2 24   < > 24   --   GLUCOSE 101*   < > 116*  --   BUN 8   < > 9  --   CREATININE 0.66   < > 0.81  --   CALCIUM 8.0*   < > 9.3  --   MG 1.5*  --   --   --    < > = values in this interval not displayed.    Cardiac Enzymes No results for input(s): TROPONINI in the last 168 hours.  Microbiology Results  No results found for this or any previous visit.  RADIOLOGY:  No results found.    Management plans discussed with the patient, family and they are in agreement.  CODE STATUS:  Code Status History    Date Active Date Inactive Code Status Order ID Comments User Context   03/21/2017 05:34 03/26/2017 15:22 Full Code [id number]  Ihor Austin, MD Inpatient      TOTAL TIME TAKING CARE OF THIS PATIENT: 40 minutes.    Houston Siren M.D on 03/28/2017 at 3:55 PM  Between 7am to 6pm - Pager - [phone]  After 6pm go to [email or web address] - Social research officer, government  [employer] [facility]  Hospitalists  Office  [phone]  CC: Primary care physician; Kandyce Rud, MD

## 2017-04-01 NOTE — Care Management (Signed)
Dr Larwance SachsBabaoff office never returned [facility] to [facility] regarding being accepted as a new [facility].  Per physician advisor recommendations, contacted the [facility] and provided patient information.  CM then contacted patient and per voicemail  instructed her to proceed to the [facility] for follow up appointment.  Instructed her she did not have to have an appointment.  CM requested a call back

## 2019-07-26 DIAGNOSIS — R7309 Other abnormal glucose: Secondary | ICD-10-CM | POA: Diagnosis not present

## 2019-07-26 DIAGNOSIS — M109 Gout, unspecified: Secondary | ICD-10-CM | POA: Diagnosis not present

## 2019-07-26 DIAGNOSIS — Z8249 Family history of ischemic heart disease and other diseases of the circulatory system: Secondary | ICD-10-CM | POA: Diagnosis not present

## 2019-07-26 DIAGNOSIS — D519 Vitamin B12 deficiency anemia, unspecified: Secondary | ICD-10-CM | POA: Diagnosis not present

## 2019-07-26 DIAGNOSIS — I10 Essential (primary) hypertension: Secondary | ICD-10-CM | POA: Diagnosis not present

## 2019-07-26 DIAGNOSIS — G4700 Insomnia, unspecified: Secondary | ICD-10-CM | POA: Diagnosis not present

## 2019-07-26 DIAGNOSIS — E119 Type 2 diabetes mellitus without complications: Secondary | ICD-10-CM | POA: Diagnosis not present

## 2019-07-26 DIAGNOSIS — E559 Vitamin D deficiency, unspecified: Secondary | ICD-10-CM | POA: Diagnosis not present

## 2019-07-26 DIAGNOSIS — H6121 Impacted cerumen, right ear: Secondary | ICD-10-CM | POA: Diagnosis not present

## 2019-07-26 DIAGNOSIS — R7989 Other specified abnormal findings of blood chemistry: Secondary | ICD-10-CM | POA: Diagnosis not present

## 2019-07-26 DIAGNOSIS — I2510 Atherosclerotic heart disease of native coronary artery without angina pectoris: Secondary | ICD-10-CM | POA: Diagnosis not present

## 2019-07-26 DIAGNOSIS — E7800 Pure hypercholesterolemia, unspecified: Secondary | ICD-10-CM | POA: Diagnosis not present

## 2020-01-23 DIAGNOSIS — M79661 Pain in right lower leg: Secondary | ICD-10-CM | POA: Diagnosis not present

## 2020-01-23 DIAGNOSIS — G603 Idiopathic progressive neuropathy: Secondary | ICD-10-CM | POA: Diagnosis not present

## 2020-01-24 DIAGNOSIS — Z23 Encounter for immunization: Secondary | ICD-10-CM | POA: Diagnosis not present

## 2020-01-24 DIAGNOSIS — R7989 Other specified abnormal findings of blood chemistry: Secondary | ICD-10-CM | POA: Diagnosis not present

## 2020-01-24 DIAGNOSIS — I10 Essential (primary) hypertension: Secondary | ICD-10-CM | POA: Diagnosis not present

## 2020-01-24 DIAGNOSIS — I2510 Atherosclerotic heart disease of native coronary artery without angina pectoris: Secondary | ICD-10-CM | POA: Diagnosis not present

## 2020-01-24 DIAGNOSIS — M109 Gout, unspecified: Secondary | ICD-10-CM | POA: Diagnosis not present

## 2020-01-24 DIAGNOSIS — E559 Vitamin D deficiency, unspecified: Secondary | ICD-10-CM | POA: Diagnosis not present

## 2020-01-24 DIAGNOSIS — E785 Hyperlipidemia, unspecified: Secondary | ICD-10-CM | POA: Diagnosis not present

## 2020-01-24 DIAGNOSIS — E538 Deficiency of other specified B group vitamins: Secondary | ICD-10-CM | POA: Diagnosis not present

## 2020-01-24 DIAGNOSIS — G4700 Insomnia, unspecified: Secondary | ICD-10-CM | POA: Diagnosis not present

## 2020-01-24 DIAGNOSIS — E7800 Pure hypercholesterolemia, unspecified: Secondary | ICD-10-CM | POA: Diagnosis not present

## 2020-01-24 DIAGNOSIS — D519 Vitamin B12 deficiency anemia, unspecified: Secondary | ICD-10-CM | POA: Diagnosis not present

## 2020-03-05 DIAGNOSIS — E7800 Pure hypercholesterolemia, unspecified: Secondary | ICD-10-CM | POA: Diagnosis not present

## 2020-03-05 DIAGNOSIS — I10 Essential (primary) hypertension: Secondary | ICD-10-CM | POA: Diagnosis not present

## 2020-03-05 DIAGNOSIS — R5383 Other fatigue: Secondary | ICD-10-CM | POA: Diagnosis not present

## 2020-03-05 DIAGNOSIS — N390 Urinary tract infection, site not specified: Secondary | ICD-10-CM | POA: Diagnosis not present

## 2020-03-05 DIAGNOSIS — R300 Dysuria: Secondary | ICD-10-CM | POA: Diagnosis not present

## 2020-03-05 DIAGNOSIS — Z118 Encounter for screening for other infectious and parasitic diseases: Secondary | ICD-10-CM | POA: Diagnosis not present

## 2020-03-05 DIAGNOSIS — Z112 Encounter for screening for other bacterial diseases: Secondary | ICD-10-CM | POA: Diagnosis not present

## 2020-03-10 DIAGNOSIS — M5459 Other low back pain: Secondary | ICD-10-CM | POA: Diagnosis not present

## 2020-03-10 DIAGNOSIS — R109 Unspecified abdominal pain: Secondary | ICD-10-CM | POA: Diagnosis not present

## 2020-03-10 DIAGNOSIS — N390 Urinary tract infection, site not specified: Secondary | ICD-10-CM | POA: Diagnosis not present

## 2020-04-09 DIAGNOSIS — Z118 Encounter for screening for other infectious and parasitic diseases: Secondary | ICD-10-CM | POA: Diagnosis not present

## 2020-04-09 DIAGNOSIS — R319 Hematuria, unspecified: Secondary | ICD-10-CM | POA: Diagnosis not present

## 2020-04-09 DIAGNOSIS — N390 Urinary tract infection, site not specified: Secondary | ICD-10-CM | POA: Diagnosis not present

## 2020-04-09 DIAGNOSIS — Z112 Encounter for screening for other bacterial diseases: Secondary | ICD-10-CM | POA: Diagnosis not present

## 2020-04-09 DIAGNOSIS — I10 Essential (primary) hypertension: Secondary | ICD-10-CM | POA: Diagnosis not present

## 2020-04-09 DIAGNOSIS — R300 Dysuria: Secondary | ICD-10-CM | POA: Diagnosis not present

## 2020-04-11 ENCOUNTER — Ambulatory Visit (INDEPENDENT_AMBULATORY_CARE_PROVIDER_SITE_OTHER): Payer: Medicare Other | Admitting: Obstetrics and Gynecology

## 2020-04-11 ENCOUNTER — Other Ambulatory Visit: Payer: Self-pay

## 2020-04-11 ENCOUNTER — Encounter: Payer: Self-pay | Admitting: Obstetrics and Gynecology

## 2020-04-11 ENCOUNTER — Other Ambulatory Visit (HOSPITAL_COMMUNITY)
Admission: RE | Admit: 2020-04-11 | Discharge: 2020-04-11 | Disposition: A | Discharge status: Home / Self Care | Payer: Medicare Other | Source: Ambulatory Visit | Attending: Obstetrics and Gynecology | Admitting: Obstetrics and Gynecology

## 2020-04-11 VITALS — BP 138/72 | Ht 60.0 in | Wt 180.0 lb

## 2020-04-11 DIAGNOSIS — R300 Dysuria: Secondary | ICD-10-CM

## 2020-04-11 DIAGNOSIS — N950 Postmenopausal bleeding: Secondary | ICD-10-CM | POA: Diagnosis not present

## 2020-04-11 DIAGNOSIS — N760 Acute vaginitis: Secondary | ICD-10-CM | POA: Diagnosis not present

## 2020-04-11 LAB — POCT URINALYSIS DIPSTICK
Bilirubin, UA: NEGATIVE
Glucose, UA: NEGATIVE
Nitrite, UA: POSITIVE
Protein, UA: NEGATIVE
Spec Grav, UA: >=1.03 — AB (ref 1.010–1.025)
Urobilinogen, UA: 0.2 E.U./dL
pH, UA: 5 (ref 5.0–8.0)

## 2020-04-11 MED ORDER — NITROFURANTOIN MONOHYD MACRO 100 MG PO CAPS: 100.0000 mg | ORAL_CAPSULE | Freq: Two times a day (BID) | ORAL | 0 refills | Status: DC

## 2020-04-11 NOTE — Patient Instructions (Signed)
Postmenopausal Bleeding  Postmenopausal bleeding is any bleeding that a woman has after she has entered into menopause. Menopause is the end of a woman's fertile years. After menopause, a woman no longer ovulates and does not have menstrual periods. Postmenopausal bleeding may have various causes, including:  Menopausal hormone therapy (MHT).  Endometrial atrophy. After menopause, low estrogen hormone levels cause the membrane that lines the uterus (endometrium) to become thinner. You may have bleeding as the endometrium thins.  Endometrial hyperplasia. This condition is caused by excess estrogen hormones and low levels of progesterone hormones. The excess estrogen causes the endometrium to thicken, which can lead to bleeding. In some cases, this can lead to cancer of the uterus.  Endometrial cancer.  Non-cancerous growths (polyps) on the endometrium, the lining of the uterus, or the cervix.  Uterine fibroids. These are non-cancerous growths in or around the uterus muscle tissue that can cause heavy bleeding. Any type of postmenopausal bleeding, even if it appears to be a typical menstrual period, should be evaluated by your health care provider. Treatment will depend on the cause of the bleeding. Follow these instructions at home:  Pay attention to any changes in your symptoms.  Avoid using tampons and douches as told by your health care provider.  Change your pads regularly.  Get regular pelvic exams and Pap tests.  Take iron supplements as told by your health care provider.  Take over-the-counter and prescription medicines only as told by your health care provider.  Keep all follow-up visits as told by your health care provider. This is important. Contact a health care provider if:  Your bleeding lasts more than 1 week.  You have abdominal pain.  You have bleeding with or after sexual intercourse.  You have bleeding that happens more often than every 3 weeks. Get help  right away if:  You have a fever, chills, headache, dizziness, muscle aches, and bleeding.  You have severe pain with bleeding.  You are passing blood clots.  You have heavy bleeding, need more than 1 pad an hour, and have never experienced this before.  You feel faint. Summary  Postmenopausal bleeding is any bleeding that a woman has after she has entered into menopause.  Postmenopausal bleeding may have various causes. Treatment will depend on the cause of the bleeding.  Any type of postmenopausal bleeding, even if it appears to be a typical menstrual period, should be evaluated by your health care provider.  Be sure to pay attention to any changes in your symptoms and keep all follow-up visits as told by your health care provider. This information is not intended to replace advice given to you by your health care provider. Make sure you discuss any questions you have with your health care provider. Document Revised: 08/17/2017 Document Reviewed: 08/03/2016 Elsevier Patient Education  2020 Elsevier Inc.  

## 2020-04-11 NOTE — Progress Notes (Signed)
Patient ID: Angela Sullivan, female   DOB: 1941-02-24, 79 y.o.   MRN: [id number]  Reason for Consult: Establish Care (Uterine bleeding)   Referred by Kandyce Rud, MD  Subjective:     HPI:  Angela Sullivan is a 79 y.o. female she presents today with complaints of postmenopausal bleeding.  She reports that this bleeding has been present for about 2 months.  She reports that the bleeding comes and goes.  She reports that it is initially heavy but then became much lighter.  She reports that she has not had bleeding in 2 to 3 days.  She was seen by her primary care physician and started on antibiotic and Azo for a urinary tract infection.  She reports that she is taking the antibiotic.  She has seen some blood on her pad.  She denies any pelvic pain.  She reports that she does have pain with urination and has seen blood in her urine.  She reports she has vulvovaginal itching as well.  She reports that about 5 years ago she had a D&C for polyp and had bleeding at that time.  She denies any history of abnormal Pap smears. She reports that she has not recently had a mammogram.  She declines screening mammography.  She reports that she has not recently had a colonoscopy and she declines referral for one.  She denies family history of breast uterine or endometrial cancer.  She denies family history of colon cancer.  She reports that she is not currently sexually active.  She reports that she is not currently a smoker.  She reports that she quit about 8 years ago smoking.  She reports that she smoked for 10 to 15 years about half pack a day.  Gynecological History Menarche: [age]. Menopause: In her 58s Describes periods as history of regular monthly menses as a young woman. Last pap smear: Unknown Last Mammogram: Unknown Sexually Active: No  Obstetrical History History of three vaginal deliveries April 12, 1955 vaginal delivery female died of throat cancer 06/02/59vaginal delivery female  died of breast cancer June 02, 1966vaginal delivery female-estranged from patient.  Past Medical History:  Diagnosis Date  . Anxiety   . Gout   . High cholesterol   . Hypertension    Family History  Problem Relation Age of Onset  . Heart disease Father   . COPD Neg Hx   . Diabetes Mellitus II Neg Hx    Past Surgical History:  Procedure Laterality Date  . DILATION AND CURETTAGE OF UTERUS      Short Social History:  Social History   Tobacco Use  . Smoking status: Never Smoker  . Smokeless tobacco: Never Used  Substance Use Topics  . Alcohol use: Yes    Comment: occasional    No Known Allergies  Current Outpatient Medications  Medication Sig Dispense Refill  . meloxicam (MOBIC) 7.5 MG tablet Take 7.5 mg by mouth 2 (two) times daily.    Marland Kitchen omega-3 acid ethyl esters (LOVAZA) 1 g capsule Take 1 g by mouth daily.    . pravastatin (PRAVACHOL) 20 MG tablet Take 20 mg by mouth at bedtime.    . Cholecalciferol 1000 units tablet Take 5,000 Units by mouth daily.    . indomethacin (INDOCIN) 25 MG capsule Take 25 mg by mouth every 6 (six) hours as needed.    Marland Kitchen lisinopril (PRINIVIL,ZESTRIL) 40 MG tablet Take 1 tablet (40 mg total) by mouth daily. 30 tablet 1  . Melatonin 5 MG  TABS Take 2 tablets by mouth at bedtime.    . nitrofurantoin, macrocrystal-monohydrate, (MACROBID) 100 MG capsule Take 1 capsule (100 mg total) by mouth 2 (two) times daily. 14 capsule 0   No current facility-administered medications for this visit.    Review of Systems  Constitutional: Negative for chills, fatigue, fever and unexpected weight change.  HENT: Negative for trouble swallowing.  Eyes: Negative for loss of vision.  Respiratory: Negative for cough, shortness of breath and wheezing.  Cardiovascular: Negative for chest pain, leg swelling, palpitations and syncope.  GI: Negative for abdominal pain, blood in stool, diarrhea, nausea and vomiting.  GU: Negative for difficulty urinating, dysuria, frequency  and hematuria.  Musculoskeletal: Negative for back pain, leg pain and joint pain.  Skin: Negative for rash.  Neurological: Negative for dizziness, headaches, light-headedness, numbness and seizures.  Psychiatric: Negative for behavioral problem, confusion, depressed mood and sleep disturbance.        Objective:  Objective   Vitals:   04/11/20 1023 04/11/20 1123  BP: (!) 160/92 138/72  Weight: 180 lb (81.6 kg)   Height: 5' (1.524 m)    Body mass index is 35.15 kg/m.  Physical Exam Vitals and nursing note reviewed.  Constitutional:      Appearance: She is well-developed.  HENT:     Head: Normocephalic and atraumatic.  Eyes:     Pupils: Pupils are equal, round, and reactive to light.  Cardiovascular:     Rate and Rhythm: Normal rate and regular rhythm.  Pulmonary:     Effort: Pulmonary effort is normal. No respiratory distress.  Genitourinary:    Comments: External: Normal appearing vulva. No lesions noted.  Speculum examination: Normal appearing cervix. Scant  blood in the vaginal vault. No discharge.   Bimanual examination: Uterus midline, non-tender, normal in size, shape and contour.  No CMT. No adnexal masses. No adnexal tenderness. Pelvis not fixed.    Skin:    General: Skin is warm and dry.  Neurological:     Mental Status: She is alert and oriented to person, place, and time.  Psychiatric:        Behavior: Behavior normal.        Thought Content: Thought content normal.        Judgment: Judgment normal.    Endometrial Biopsy After discussion with the patient regarding her abnormal uterine bleeding I recommended that she proceed with an endometrial biopsy for further diagnosis. The risks, benefits, alternatives, and indications for an endometrial biopsy were discussed with the patient in detail. She understood the risks including infection, bleeding, cervical laceration and uterine perforation.  Verbal consent was obtained.   PROCEDURE NOTE:  Pipelle endometrial  biopsy was performed using aseptic technique with iodine preparation.  The uterus was sounded to a length of 7.5 cm.  Adequate sampling was obtained with minimal blood loss.  The patient tolerated the procedure well.  Disposition will be pending pathology.  Annamarie Major, MD, FACOG [facility], [facility] 04/11/2020  1:45 PM   Assessment/Plan:     79 yo with Postmenopausal bleeding Endometrial biopsy today Will follow up for GYN [place] Nuswab for vulvovaginitis Urine culture, patient reports she is taking antibiotics.   More than 30 minutes were spent face to face with the patient in the room, reviewing the medical record, labs and images, and coordinating care for the patient. The plan of management was discussed in detail and counseling was provided.    Adelene Idler MD [facility], [facility] 04/11/2020 1:44 PM

## 2020-04-11 NOTE — Progress Notes (Signed)
Uterine bleeding, RM 3

## 2020-04-14 LAB — URINE CULTURE

## 2020-04-14 LAB — SURGICAL PATHOLOGY

## 2020-04-15 ENCOUNTER — Telehealth: Payer: Self-pay | Admitting: Obstetrics and Gynecology

## 2020-04-15 LAB — NUSWAB VAGINITIS PLUS (VG+)
Candida albicans, NAA: NEGATIVE
Candida glabrata, NAA: NEGATIVE
Chlamydia trachomatis, NAA: NEGATIVE
Neisseria gonorrhoeae, NAA: NEGATIVE
Trich vag by NAA: NEGATIVE

## 2020-04-15 NOTE — Telephone Encounter (Signed)
Patient is calling for labs results. Please advise. 

## 2020-05-09 ENCOUNTER — Other Ambulatory Visit: Payer: Self-pay

## 2020-05-09 ENCOUNTER — Ambulatory Visit (INDEPENDENT_AMBULATORY_CARE_PROVIDER_SITE_OTHER): Payer: Medicare Other

## 2020-05-09 ENCOUNTER — Encounter: Payer: Self-pay | Admitting: Obstetrics and Gynecology

## 2020-05-09 ENCOUNTER — Ambulatory Visit (INDEPENDENT_AMBULATORY_CARE_PROVIDER_SITE_OTHER): Payer: Medicare Other | Admitting: Obstetrics and Gynecology

## 2020-05-09 ENCOUNTER — Other Ambulatory Visit: Payer: Self-pay | Admitting: Obstetrics and Gynecology

## 2020-05-09 VITALS — BP 130/70 | Ht 60.0 in | Wt 178.0 lb

## 2020-05-09 DIAGNOSIS — N8502 Endometrial intraepithelial neoplasia [EIN]: Secondary | ICD-10-CM | POA: Insufficient documentation

## 2020-05-09 DIAGNOSIS — N950 Postmenopausal bleeding: Secondary | ICD-10-CM

## 2020-05-09 NOTE — Progress Notes (Signed)
Patient ID: Angela Sullivan, female   DOB: [birth year], 79 y.o.   MRN: [id number]  Reason for Consult: Follow-up (U/s)   Referred by Kandyce Rud, MD  Subjective:     HPI:  Angela Sullivan is a 79 y.o. female. She is her today for follow up regarding post menopasual bleeding. She has no complaints. She was not able to be reached previously to discuss her abnormal biopsy. She and her emergency contact do not have active phone numbers or answering machines.     Past Medical History:  Diagnosis Date   Anxiety    Gout    High cholesterol    Hypertension    Family History  Problem Relation Age of Onset   Heart disease Father    COPD Neg Hx    Diabetes Mellitus II Neg Hx    Past Surgical History:  Procedure Laterality Date   DILATION AND CURETTAGE OF UTERUS      Short Social History:  Social History   Tobacco Use   Smoking status: Never Smoker   Smokeless tobacco: Never Used  Substance Use Topics   Alcohol use: Yes    Comment: occasional    No Known Allergies  Current Outpatient Medications  Medication Sig Dispense Refill   Cholecalciferol 1000 units tablet Take 5,000 Units by mouth daily.     indomethacin (INDOCIN) 25 MG capsule Take 25 mg by mouth every 6 (six) hours as needed.     Melatonin 5 MG TABS Take 2 tablets by mouth at bedtime.     meloxicam (MOBIC) 7.5 MG tablet Take 7.5 mg by mouth 2 (two) times daily.     omega-3 acid ethyl esters (LOVAZA) 1 g capsule Take 1 g by mouth daily.     pravastatin (PRAVACHOL) 20 MG tablet Take 20 mg by mouth at bedtime.     lisinopril (PRINIVIL,ZESTRIL) 40 MG tablet Take 1 tablet (40 mg total) by mouth daily. 30 tablet 1   No current facility-administered medications for this visit.    Review of Systems  Constitutional: Negative for chills, fatigue, fever and unexpected weight change.  HENT: Negative for trouble swallowing.  Eyes: Negative for loss of vision.  Respiratory: Negative for cough, shortness  of breath and wheezing.  Cardiovascular: Negative for chest pain, leg swelling, palpitations and syncope.  GI: Negative for abdominal pain, blood in stool, diarrhea, nausea and vomiting.  GU: Negative for difficulty urinating, dysuria, frequency and hematuria.  Musculoskeletal: Negative for back pain, leg pain and joint pain.  Skin: Negative for rash.  Neurological: Negative for dizziness, headaches, light-headedness, numbness and seizures.  Psychiatric: Negative for behavioral problem, confusion, depressed mood and sleep disturbance.        Objective:  Objective   Vitals:   05/09/20 0935  BP: 130/70  Weight: 178 lb (80.7 kg)  Height: 5' (1.524 m)   Body mass index is 34.76 kg/m.  Physical Exam Vitals and nursing note reviewed.  Constitutional:      Appearance: She is well-developed and well-nourished.  HENT:     Head: Normocephalic and atraumatic.  Eyes:     Extraocular Movements: EOM normal.     Pupils: Pupils are equal, round, and reactive to light.  Cardiovascular:     Rate and Rhythm: Normal rate and regular rhythm.  Pulmonary:     Effort: Pulmonary effort is normal. No respiratory distress.  Skin:    General: Skin is warm and dry.  Neurological:     Mental Status: She is alert  and oriented to person, place, and time.  Psychiatric:        Mood and Affect: Mood and affect normal.        Behavior: Behavior normal.        Thought Content: Thought content normal.        Judgment: Judgment normal.     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  Discussed need for hysteroscopy D&C. Consented patient for the procedure. Patient was able to schedule today with [facility] in the office for January. Discussed risks of bleeding, damage to surrounding pelvic organs, and infection.   More than 30 minutes were spent face to face with the patient in the room, reviewing the medical record, labs and images, and coordinating care for the patient. The plan of management  was discussed in detail and counseling was provided.       Adelene Idler MD [facility], [facility] 05/09/2020 1:51 PM

## 2020-05-12 ENCOUNTER — Telehealth: Payer: Self-pay | Admitting: Obstetrics and Gynecology

## 2020-05-12 NOTE — Telephone Encounter (Signed)
-----   Message from Natale Milch, MD sent at 05/09/2020 10:17 AM EST ----- Surgery Booking Request Patient Full Name:  Sulema Braid  MRN: [id number]  DOB: 1940-08-05  Surgeon: Natale Milch, MD  Requested Surgery Date and Time: January 2021 Primary Diagnosis AND Code: Endometrial hyperplasia with atypia Secondary Diagnosis and Code:  Surgical Procedure: Hysteroscopy D&C RNFA Requested?: No L&D Notification: No Admission Status: same day surgery Length of Surgery: 50 min Special Case Needs: No H&P: No Phone Interview???:  No Interpreter: No Medical Clearance:  Yes Special Scheduling Instructions: No Any known health/anesthesia issues, diabetes, sleep apnea, latex allergy, defibrillator/pacemaker?: No Acuity: P3   (P1 highest, P2 delay may cause harm, P3 low, elective gyn, P4 lowest)

## 2020-05-12 NOTE — Telephone Encounter (Signed)
Spoke to patient in clinic on 12/17 to schedule Hysteroscopy D&C  DOS 06/10/20  H&P N/A    Covid testing 06/06/20 @ 8-10:30, [facility] [employer], drive up and wear mask. Advised pt to quarantine until DOS.  Pre-admit phone call appointment to be requested.  Explained that this appointment has a call window. Based on the time scheduled will indicate if the call will be received within a 4 hour window before 1:00 or after.  Advised that pt may also receive calls from the hospital pharmacy and pre-service center.  Confirmed pt has [facility] Medicare as primary insurance. Medicaid as secondary insurance.  Medical clearance requested - sent to Franco Nones, FNP

## 2020-05-13 NOTE — Telephone Encounter (Signed)
Orders placed, thank you

## 2020-06-02 ENCOUNTER — Other Ambulatory Visit: Payer: Medicare Other

## 2020-06-03 ENCOUNTER — Telehealth: Payer: Self-pay | Admitting: Obstetrics and Gynecology

## 2020-06-03 NOTE — Telephone Encounter (Signed)
LM for Franco Nones, FNP, regarding medical clearance for pt

## 2020-06-06 ENCOUNTER — Other Ambulatory Visit: Payer: Medicare Other

## 2020-06-10 ENCOUNTER — Ambulatory Visit
Admission: RE | Admit: 2020-06-10 | Payer: Medicare Other | Source: Home / Self Care | Admitting: Obstetrics and Gynecology

## 2020-06-10 ENCOUNTER — Encounter: Admission: RE | Payer: Self-pay | Source: Home / Self Care

## 2020-06-10 SURGERY — DILATATION AND CURETTAGE /HYSTEROSCOPY
Anesthesia: Choice

## 2020-06-18 ENCOUNTER — Telehealth: Payer: Self-pay

## 2020-06-18 NOTE — Telephone Encounter (Signed)
Called Cheryl Lindley's nurse line and left msg adv need for medical clearance for surgery.

## 2020-06-20 ENCOUNTER — Ambulatory Visit: Payer: Medicare Other | Admitting: Obstetrics and Gynecology

## 2020-06-20 ENCOUNTER — Ambulatory Visit (INDEPENDENT_AMBULATORY_CARE_PROVIDER_SITE_OTHER): Payer: Medicare Other | Admitting: Obstetrics and Gynecology

## 2020-06-20 ENCOUNTER — Encounter: Payer: Self-pay | Admitting: Obstetrics and Gynecology

## 2020-06-20 ENCOUNTER — Telehealth: Payer: Self-pay

## 2020-06-20 ENCOUNTER — Other Ambulatory Visit: Payer: Self-pay

## 2020-06-20 VITALS — BP 130/74 | Ht 62.0 in | Wt 175.0 lb

## 2020-06-20 DIAGNOSIS — N950 Postmenopausal bleeding: Secondary | ICD-10-CM

## 2020-06-20 DIAGNOSIS — N8502 Endometrial intraepithelial neoplasia [EIN]: Secondary | ICD-10-CM

## 2020-06-20 NOTE — Telephone Encounter (Signed)
Spoke w pt in clinic to reschedule Hyst D&C w [facility]  DOS 3/10  H&P 3/3 @ 8:10  Covid testing 3/8 @ 8-10:30

## 2020-06-20 NOTE — Progress Notes (Signed)
Patient ID: Angela Sullivan, female   DOB: [birth year], 80 y.o.   MRN: [id number]  Reason for Consult: Gynecologic Exam   Referred by Kandyce Rud, MD  Subjective:     HPI:  Angela Sullivan is a 80 y.o. female. She presents today for follow up. She reports that she is not having vaginal bleeding. She was scheduled for a hysteroscopy D&C related to her PMB and hyperplasia with atypia biopsy. She cancelled the case because of the covid surge and snow.   Past Medical History:  Diagnosis Date  . Anxiety   . Gout   . High cholesterol   . Hypertension    Family History  Problem Relation Age of Onset  . Heart disease Father   . COPD Neg Hx   . Diabetes Mellitus II Neg Hx    Past Surgical History:  Procedure Laterality Date  . DILATION AND CURETTAGE OF UTERUS      Short Social History:  Social History   Tobacco Use  . Smoking status: Never Smoker  . Smokeless tobacco: Never Used  Substance Use Topics  . Alcohol use: Yes    Comment: occasional    No Known Allergies  Current Outpatient Medications  Medication Sig Dispense Refill  . Cholecalciferol 1000 units tablet Take 5,000 Units by mouth daily.    . indomethacin (INDOCIN) 25 MG capsule Take 25 mg by mouth every 6 (six) hours as needed.    . Melatonin 5 MG TABS Take 2 tablets by mouth at bedtime.    . meloxicam (MOBIC) 7.5 MG tablet Take 7.5 mg by mouth 2 (two) times daily.    Marland Kitchen omega-3 acid ethyl esters (LOVAZA) 1 g capsule Take 1 g by mouth daily.    . pravastatin (PRAVACHOL) 20 MG tablet Take 20 mg by mouth at bedtime.    Marland Kitchen lisinopril (PRINIVIL,ZESTRIL) 40 MG tablet Take 1 tablet (40 mg total) by mouth daily. 30 tablet 1   No current facility-administered medications for this visit.    Review of Systems  Constitutional: Negative for chills, fatigue, fever and unexpected weight change.  HENT: Negative for trouble swallowing.  Eyes: Negative for loss of vision.  Respiratory: Negative for cough, shortness of  breath and wheezing.  Cardiovascular: Negative for chest pain, leg swelling, palpitations and syncope.  GI: Negative for abdominal pain, blood in stool, diarrhea, nausea and vomiting.  GU: Negative for difficulty urinating, dysuria, frequency and hematuria.  Musculoskeletal: Negative for back pain, leg pain and joint pain.  Skin: Negative for rash.  Neurological: Negative for dizziness, headaches, light-headedness, numbness and seizures.  Psychiatric: Negative for behavioral problem, confusion, depressed mood and sleep disturbance.        Objective:  Objective   Vitals:   06/20/20 1329  BP: 130/74  Weight: 175 lb (79.4 kg)  Height: 5\' 2"  (1.575 m)   Body mass index is 32.01 kg/m.  Physical Exam Vitals and nursing note reviewed.  Constitutional:      Appearance: She is well-developed and well-nourished.  HENT:     Head: Normocephalic and atraumatic.  Eyes:     Extraocular Movements: EOM normal.     Pupils: Pupils are equal, round, and reactive to light.  Cardiovascular:     Rate and Rhythm: Normal rate and regular rhythm.  Pulmonary:     Effort: Pulmonary effort is normal. No respiratory distress.  Skin:    General: Skin is warm and dry.  Neurological:     Mental Status: She is alert and  oriented to person, place, and time.  Psychiatric:        Mood and Affect: Mood and affect normal.        Behavior: Behavior normal.        Thought Content: Thought content normal.        Judgment: Judgment normal.     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  Patient spoke with [facility] today and scheduled surgery for July 31 2020.   More than 10 minutes were spent face to face with the patient in the room, reviewing the medical record, labs and images, and coordinating care for the patient. The plan of management was discussed in detail and counseling was provided.       Adelene Idler MD [facility], [facility] 06/20/2020 1:48 PM

## 2020-06-20 NOTE — Patient Instructions (Signed)
Hysteroscopy Hysteroscopy is a procedure used to look inside a woman's womb (uterus). This may be done for various reasons, including:  To look for tumors and other growths in the uterus.  To evaluate abnormal bleeding, fibroid tumors, polyps, scar tissue, or uterine cancer.  To determine why a woman is unable to get pregnant or has had repeated pregnancy losses.  To locate an IUD (intrauterine device).  To place a birth control device into the fallopian tubes. During this procedure, a thin, flexible tube with a small light and camera (hysteroscope) is used to examine the uterus. The camera sends images to a monitor in the room so that your health care provider can view the inside of your uterus. A hysteroscopy should be done right after a menstrual period. Tell a health care provider about:  Any allergies you have.  All medicines you are taking, including vitamins, herbs, eye drops, creams, and over-the-counter medicines.  Any problems you or family members have had with anesthetic medicines.  Any blood disorders you have.  Any surgeries you have had.  Any medical conditions you have.  Whether you are pregnant or may be pregnant.  Whether you have been diagnosed with an STI (sexually transmitted infection) or you think you have an STI. What are the risks? Generally, this is a safe procedure. However, problems may occur, including:  Excessive bleeding.  Infection.  Damage to the uterus or other structures or organs.  Allergic reaction to medicines or fluids that are used in the procedure. What happens before the procedure? Staying hydrated Follow instructions from your health care provider about hydration, which may include:  Up to 2 hours before the procedure - you may continue to drink clear liquids, such as water, clear fruit juice, black coffee, and plain tea. Eating and drinking restrictions Follow instructions from your health care provider about eating and  drinking, which may include:  8 hours before the procedure - stop eating solid foods and drink clear liquids only.  2 hours before the procedure - stop drinking clear liquids. Medicines  Ask your health care provider about: ? Changing or stopping your regular medicines. This is especially important if you are taking diabetes medicines or blood thinners. ? Taking medicines such as aspirin and ibuprofen. These medicines can thin your blood. Do not take these medicines unless your health care provider tells you to take them. ? Taking over-the-counter medicines, vitamins, herbs, and supplements.  Medicine may be placed in your cervix the day before the procedure. This medicine causes the cervix to open (dilate). The larger opening makes it easier for the hysteroscope to be inserted into the uterus during the procedure. General instructions  Ask your health care provider: ? What steps will be taken to help prevent infection. These steps may include:  Washing skin with a germ-killing soap.  Taking antibiotic medicine.  Do not use any products that contain nicotine or tobacco for at least 4 weeks before the procedure. These products include cigarettes, chewing tobacco, and vaping devices, such as e-cigarettes. If you need help quitting, ask your health care provider.  Plan to have a responsible adult take you home from the hospital or clinic.  Plan to have a responsible adult care for you for the time you are told after you leave the hospital or clinic. This is important.  Empty your bladder before the procedure begins. What happens during the procedure?  An IV will be inserted into one of your veins.  You may be given: ?   A medicine to help you relax (sedative). ? A medicine that numbs the area around the cervix (local anesthetic). ? A medicine to make you fall asleep (general anesthetic).  A hysteroscope will be inserted through your vagina and into your uterus.  Air or fluid will  be used to enlarge your uterus to allow your health care provider to see it better. The amount of fluid used will be carefully checked throughout the procedure.  In some cases, tissue may be gently scraped from inside the uterus and sent to a lab for testing (biopsy). The procedure may vary among health care providers and hospitals. What can I expect after the procedure?  Your blood pressure, heart rate, breathing rate, and blood oxygen level will be monitored until you leave the hospital or clinic.  You may have cramps. You may be given medicines for this.  You may have bleeding, which may vary from light spotting to menstrual-like bleeding. This is normal.  If you had a biopsy, it is up to you to get the results. Ask your health care provider, or the department that is doing the procedure, when your results will be ready. Follow these instructions at home: Activity  Rest as told by your health care provider.  Return to your normal activities as told by your health care provider. Ask your health care provider what activities are safe for you.  If you were given a sedative during the procedure, it can affect you for several hours. Do not drive or operate machinery until your health care provider says that it is safe. Medicines  Do not take aspirin or other NSAIDs during recovery, as told by your healthcare provider. It can increase the risk of bleeding.  Ask your health care provider if the medicine prescribed to you: ? Requires you to avoid driving or using machinery. ? Can cause constipation. You may need to take these actions to prevent or treat constipation:  Drink enough fluid to keep your urine pale yellow.  Take over-the-counter or prescription medicines.  Eat foods that are high in fiber, such as beans, whole grains, and fresh fruits and vegetables.  Limit foods that are high in fat and processed sugars, such as fried or sweet foods. General instructions  Do not douche,  use tampons, or have sex for 2 weeks after the procedure, or until your health care provider approves.  Do not take baths, swim, or use a hot tub until your health care provider approves. Take showers instead of baths for 2 weeks, or for as long as told by your health care provider.  Keep all follow-up visits. This is important. Contact a health care provider if:  You feel dizzy or lightheaded.  You feel nauseous.  You have abnormal vaginal discharge.  You have a rash.  You have pain that does not get better with medicine.  You have chills. Get help right away if:  You have bleeding that is heavier than a normal menstrual period.  You have a fever.  You have pain or cramps that get worse.  You develop new abdominal pain.  You faint.  You have pain in your shoulder.  You are short of breath. Summary  Hysteroscopy is a procedure that is used to look inside a woman's womb (uterus).  After the procedure, you may have bleeding, which varies from light spotting to menstrual-like bleeding. This is normal. You may also have cramps.  Do not douche, use tampons, or have sex for 2 weeks after   the procedure, or until your health care provider approves.  Plan to have a responsible adult take you home from the hospital or clinic. This information is not intended to replace advice given to you by your health care provider. Make sure you discuss any questions you have with your health care provider. Document Revised: 12/26/2019 Document Reviewed: 12/26/2019 Elsevier Patient Education  2021 [employer]  

## 2020-06-23 ENCOUNTER — Telehealth: Payer: Self-pay

## 2020-06-23 ENCOUNTER — Other Ambulatory Visit: Payer: Self-pay | Admitting: Obstetrics and Gynecology

## 2020-06-23 DIAGNOSIS — N950 Postmenopausal bleeding: Secondary | ICD-10-CM

## 2020-06-23 DIAGNOSIS — N8502 Endometrial intraepithelial neoplasia [EIN]: Secondary | ICD-10-CM

## 2020-06-23 MED ORDER — MEDROXYPROGESTERONE ACETATE 5 MG PO TABS
5.0000 mg | ORAL_TABLET | Freq: Every day | ORAL | 0 refills | Status: DC
Start: 2020-06-23 — End: 2020-07-29

## 2020-06-23 NOTE — Telephone Encounter (Signed)
Let patient know I sent rx for provera- take once a day for 10 days.

## 2020-06-23 NOTE — Telephone Encounter (Signed)
Orders placed.

## 2020-06-23 NOTE — Telephone Encounter (Signed)
Pt calling; has D&C scheduled; still bleeding; wants something called in to stop the bleeding.  [phone]

## 2020-06-23 NOTE — Telephone Encounter (Signed)
Patient is aware 

## 2020-07-01 ENCOUNTER — Ambulatory Visit: Payer: Medicare Other | Admitting: Obstetrics and Gynecology

## 2020-07-17 ENCOUNTER — Telehealth: Payer: Self-pay

## 2020-07-17 NOTE — Telephone Encounter (Signed)
I rec'd a call from Angela Sullivan saying that the patient called them and wanted to cancel her procedure because "nothing else was happening". I am routing this note to [facility] in the event she wishes to contact patient should that be necessary.

## 2020-07-24 ENCOUNTER — Encounter: Payer: Medicare Other | Admitting: Obstetrics and Gynecology

## 2020-07-25 ENCOUNTER — Other Ambulatory Visit: Payer: Medicare Other

## 2020-07-29 ENCOUNTER — Other Ambulatory Visit: Admission: RE | Admit: 2020-07-29 | Payer: Medicare Other | Source: Ambulatory Visit

## 2020-07-29 ENCOUNTER — Other Ambulatory Visit: Payer: Medicare Other

## 2020-07-29 ENCOUNTER — Other Ambulatory Visit: Payer: Self-pay | Admitting: Obstetrics and Gynecology

## 2020-07-29 DIAGNOSIS — N8502 Endometrial intraepithelial neoplasia [EIN]: Secondary | ICD-10-CM

## 2020-07-29 MED ORDER — MEDROXYPROGESTERONE ACETATE 10 MG PO TABS: 10.0000 mg | ORAL_TABLET | Freq: Every day | ORAL | 4 refills | Status: DC

## 2020-07-29 NOTE — Progress Notes (Signed)
Patient cancelled her planned hysteroscopy D&C for endometrial hyperplasia with atypia. I called the patient to follow up with her. She refuses to consider hysterectomy or IUD placement. She understands that she has a risk of endometrial cancer.  Regression rates are superior with IUD placement (90%) versus medical management (67%).  Advised patient to follow up for repeat biopsy in jun 2022. Patient said, "we'll see."  She reports she is not having further bleeding.   Adelene Idler MD, [facility], [facility] 07/29/2020 1:33 PM

## 2020-07-31 ENCOUNTER — Ambulatory Visit
Admission: RE | Admit: 2020-07-31 | Payer: Medicare Other | Source: Home / Self Care | Admitting: Obstetrics and Gynecology

## 2020-07-31 ENCOUNTER — Encounter: Admission: RE | Payer: Self-pay | Source: Home / Self Care

## 2020-07-31 SURGERY — DILATATION AND CURETTAGE /HYSTEROSCOPY
Anesthesia: Choice

## 2020-08-06 DIAGNOSIS — E039 Hypothyroidism, unspecified: Secondary | ICD-10-CM | POA: Diagnosis not present

## 2020-08-06 DIAGNOSIS — R7989 Other specified abnormal findings of blood chemistry: Secondary | ICD-10-CM | POA: Diagnosis not present

## 2020-08-06 DIAGNOSIS — M109 Gout, unspecified: Secondary | ICD-10-CM | POA: Diagnosis not present

## 2020-08-06 DIAGNOSIS — E559 Vitamin D deficiency, unspecified: Secondary | ICD-10-CM | POA: Diagnosis not present

## 2020-08-06 DIAGNOSIS — Z79899 Other long term (current) drug therapy: Secondary | ICD-10-CM | POA: Diagnosis not present

## 2020-08-06 DIAGNOSIS — D519 Vitamin B12 deficiency anemia, unspecified: Secondary | ICD-10-CM | POA: Diagnosis not present

## 2020-08-06 DIAGNOSIS — E7800 Pure hypercholesterolemia, unspecified: Secondary | ICD-10-CM | POA: Diagnosis not present

## 2020-08-06 DIAGNOSIS — I10 Essential (primary) hypertension: Secondary | ICD-10-CM | POA: Diagnosis not present

## 2020-08-07 DIAGNOSIS — G603 Idiopathic progressive neuropathy: Secondary | ICD-10-CM | POA: Diagnosis not present

## 2020-08-07 DIAGNOSIS — M79661 Pain in right lower leg: Secondary | ICD-10-CM | POA: Diagnosis not present

## 2020-12-01 DIAGNOSIS — I10 Essential (primary) hypertension: Secondary | ICD-10-CM | POA: Diagnosis not present

## 2020-12-01 DIAGNOSIS — E119 Type 2 diabetes mellitus without complications: Secondary | ICD-10-CM | POA: Diagnosis not present

## 2020-12-01 DIAGNOSIS — E7800 Pure hypercholesterolemia, unspecified: Secondary | ICD-10-CM | POA: Diagnosis not present

## 2020-12-01 DIAGNOSIS — E559 Vitamin D deficiency, unspecified: Secondary | ICD-10-CM | POA: Diagnosis not present

## 2020-12-01 DIAGNOSIS — M109 Gout, unspecified: Secondary | ICD-10-CM | POA: Diagnosis not present

## 2020-12-01 DIAGNOSIS — I440 Atrioventricular block, first degree: Secondary | ICD-10-CM | POA: Diagnosis not present

## 2020-12-01 DIAGNOSIS — Z0100 Encounter for examination of eyes and vision without abnormal findings: Secondary | ICD-10-CM | POA: Diagnosis not present

## 2020-12-01 DIAGNOSIS — Z1389 Encounter for screening for other disorder: Secondary | ICD-10-CM | POA: Diagnosis not present

## 2020-12-01 DIAGNOSIS — E538 Deficiency of other specified B group vitamins: Secondary | ICD-10-CM | POA: Diagnosis not present

## 2020-12-01 DIAGNOSIS — I6521 Occlusion and stenosis of right carotid artery: Secondary | ICD-10-CM | POA: Diagnosis not present

## 2020-12-01 DIAGNOSIS — R0600 Dyspnea, unspecified: Secondary | ICD-10-CM | POA: Diagnosis not present

## 2020-12-01 DIAGNOSIS — Z0001 Encounter for general adult medical examination with abnormal findings: Secondary | ICD-10-CM | POA: Diagnosis not present

## 2020-12-01 DIAGNOSIS — E039 Hypothyroidism, unspecified: Secondary | ICD-10-CM | POA: Diagnosis not present

## 2020-12-01 DIAGNOSIS — R008 Other abnormalities of heart beat: Secondary | ICD-10-CM | POA: Diagnosis not present

## 2020-12-01 DIAGNOSIS — R5383 Other fatigue: Secondary | ICD-10-CM | POA: Diagnosis not present

## 2020-12-16 DIAGNOSIS — I361 Nonrheumatic tricuspid (valve) insufficiency: Secondary | ICD-10-CM | POA: Diagnosis not present

## 2020-12-16 DIAGNOSIS — D649 Anemia, unspecified: Secondary | ICD-10-CM | POA: Diagnosis not present

## 2020-12-16 DIAGNOSIS — I509 Heart failure, unspecified: Secondary | ICD-10-CM | POA: Diagnosis not present

## 2020-12-16 DIAGNOSIS — I6521 Occlusion and stenosis of right carotid artery: Secondary | ICD-10-CM | POA: Diagnosis not present

## 2020-12-16 DIAGNOSIS — D519 Vitamin B12 deficiency anemia, unspecified: Secondary | ICD-10-CM | POA: Diagnosis not present

## 2020-12-16 DIAGNOSIS — R011 Cardiac murmur, unspecified: Secondary | ICD-10-CM | POA: Diagnosis not present

## 2020-12-16 DIAGNOSIS — I440 Atrioventricular block, first degree: Secondary | ICD-10-CM | POA: Diagnosis not present

## 2020-12-16 DIAGNOSIS — I340 Nonrheumatic mitral (valve) insufficiency: Secondary | ICD-10-CM | POA: Diagnosis not present

## 2020-12-16 DIAGNOSIS — I517 Cardiomegaly: Secondary | ICD-10-CM | POA: Diagnosis not present

## 2021-05-10 ENCOUNTER — Emergency Department: Payer: Medicare Other

## 2021-05-10 ENCOUNTER — Other Ambulatory Visit: Payer: Self-pay

## 2021-05-10 ENCOUNTER — Inpatient Hospital Stay
Admission: EM | Admit: 2021-05-10 | Discharge: 2021-06-24 | DRG: 377 | Disposition: E | Payer: Medicare Other | Attending: Family Medicine | Admitting: Family Medicine

## 2021-05-10 DIAGNOSIS — S299XXA Unspecified injury of thorax, initial encounter: Secondary | ICD-10-CM | POA: Diagnosis not present

## 2021-05-10 DIAGNOSIS — R52 Pain, unspecified: Secondary | ICD-10-CM | POA: Diagnosis not present

## 2021-05-10 DIAGNOSIS — Y92009 Unspecified place in unspecified non-institutional (private) residence as the place of occurrence of the external cause: Secondary | ICD-10-CM | POA: Diagnosis not present

## 2021-05-10 DIAGNOSIS — S6991XA Unspecified injury of right wrist, hand and finger(s), initial encounter: Secondary | ICD-10-CM | POA: Diagnosis not present

## 2021-05-10 DIAGNOSIS — I517 Cardiomegaly: Secondary | ICD-10-CM | POA: Diagnosis not present

## 2021-05-10 DIAGNOSIS — K922 Gastrointestinal hemorrhage, unspecified: Secondary | ICD-10-CM | POA: Diagnosis present

## 2021-05-10 DIAGNOSIS — R0602 Shortness of breath: Secondary | ICD-10-CM | POA: Diagnosis not present

## 2021-05-10 DIAGNOSIS — Z7189 Other specified counseling: Secondary | ICD-10-CM | POA: Diagnosis not present

## 2021-05-10 DIAGNOSIS — L89892 Pressure ulcer of other site, stage 2: Secondary | ICD-10-CM | POA: Diagnosis present

## 2021-05-10 DIAGNOSIS — D689 Coagulation defect, unspecified: Secondary | ICD-10-CM | POA: Diagnosis present

## 2021-05-10 DIAGNOSIS — I10 Essential (primary) hypertension: Secondary | ICD-10-CM | POA: Diagnosis present

## 2021-05-10 DIAGNOSIS — R7989 Other specified abnormal findings of blood chemistry: Secondary | ICD-10-CM | POA: Diagnosis present

## 2021-05-10 DIAGNOSIS — I4891 Unspecified atrial fibrillation: Secondary | ICD-10-CM | POA: Diagnosis present

## 2021-05-10 DIAGNOSIS — E669 Obesity, unspecified: Secondary | ICD-10-CM | POA: Diagnosis present

## 2021-05-10 DIAGNOSIS — Z6837 Body mass index (BMI) 37.0-37.9, adult: Secondary | ICD-10-CM

## 2021-05-10 DIAGNOSIS — E7800 Pure hypercholesterolemia, unspecified: Secondary | ICD-10-CM | POA: Diagnosis present

## 2021-05-10 DIAGNOSIS — Z9181 History of falling: Secondary | ICD-10-CM

## 2021-05-10 DIAGNOSIS — E538 Deficiency of other specified B group vitamins: Secondary | ICD-10-CM | POA: Diagnosis present

## 2021-05-10 DIAGNOSIS — M6282 Rhabdomyolysis: Secondary | ICD-10-CM | POA: Diagnosis not present

## 2021-05-10 DIAGNOSIS — J9601 Acute respiratory failure with hypoxia: Secondary | ICD-10-CM | POA: Diagnosis not present

## 2021-05-10 DIAGNOSIS — R140 Abdominal distension (gaseous): Secondary | ICD-10-CM | POA: Diagnosis not present

## 2021-05-10 DIAGNOSIS — D62 Acute posthemorrhagic anemia: Secondary | ICD-10-CM | POA: Diagnosis not present

## 2021-05-10 DIAGNOSIS — I110 Hypertensive heart disease with heart failure: Secondary | ICD-10-CM | POA: Diagnosis present

## 2021-05-10 DIAGNOSIS — E871 Hypo-osmolality and hyponatremia: Secondary | ICD-10-CM | POA: Diagnosis present

## 2021-05-10 DIAGNOSIS — Z751 Person awaiting admission to adequate facility elsewhere: Secondary | ICD-10-CM

## 2021-05-10 DIAGNOSIS — R5381 Other malaise: Secondary | ICD-10-CM | POA: Diagnosis present

## 2021-05-10 DIAGNOSIS — W19XXXA Unspecified fall, initial encounter: Secondary | ICD-10-CM | POA: Diagnosis not present

## 2021-05-10 DIAGNOSIS — I351 Nonrheumatic aortic (valve) insufficiency: Secondary | ICD-10-CM | POA: Diagnosis not present

## 2021-05-10 DIAGNOSIS — R188 Other ascites: Secondary | ICD-10-CM | POA: Diagnosis not present

## 2021-05-10 DIAGNOSIS — R601 Generalized edema: Secondary | ICD-10-CM | POA: Diagnosis present

## 2021-05-10 DIAGNOSIS — R109 Unspecified abdominal pain: Secondary | ICD-10-CM

## 2021-05-10 DIAGNOSIS — R54 Age-related physical debility: Secondary | ICD-10-CM | POA: Diagnosis not present

## 2021-05-10 DIAGNOSIS — Z20822 Contact with and (suspected) exposure to covid-19: Secondary | ICD-10-CM | POA: Diagnosis not present

## 2021-05-10 DIAGNOSIS — D72829 Elevated white blood cell count, unspecified: Secondary | ICD-10-CM | POA: Diagnosis not present

## 2021-05-10 DIAGNOSIS — T68XXXA Hypothermia, initial encounter: Secondary | ICD-10-CM | POA: Diagnosis not present

## 2021-05-10 DIAGNOSIS — K264 Chronic or unspecified duodenal ulcer with hemorrhage: Principal | ICD-10-CM | POA: Diagnosis present

## 2021-05-10 DIAGNOSIS — Z515 Encounter for palliative care: Secondary | ICD-10-CM | POA: Diagnosis not present

## 2021-05-10 DIAGNOSIS — T796XXA Traumatic ischemia of muscle, initial encounter: Secondary | ICD-10-CM | POA: Diagnosis not present

## 2021-05-10 DIAGNOSIS — I5031 Acute diastolic (congestive) heart failure: Secondary | ICD-10-CM | POA: Diagnosis not present

## 2021-05-10 DIAGNOSIS — Z743 Need for continuous supervision: Secondary | ICD-10-CM | POA: Diagnosis not present

## 2021-05-10 DIAGNOSIS — I4819 Other persistent atrial fibrillation: Secondary | ICD-10-CM | POA: Diagnosis not present

## 2021-05-10 DIAGNOSIS — K625 Hemorrhage of anus and rectum: Secondary | ICD-10-CM | POA: Diagnosis not present

## 2021-05-10 DIAGNOSIS — S7001XA Contusion of right hip, initial encounter: Secondary | ICD-10-CM | POA: Diagnosis not present

## 2021-05-10 DIAGNOSIS — Z79899 Other long term (current) drug therapy: Secondary | ICD-10-CM

## 2021-05-10 DIAGNOSIS — N179 Acute kidney failure, unspecified: Secondary | ICD-10-CM | POA: Diagnosis not present

## 2021-05-10 DIAGNOSIS — M542 Cervicalgia: Secondary | ICD-10-CM | POA: Diagnosis not present

## 2021-05-10 DIAGNOSIS — Z791 Long term (current) use of non-steroidal anti-inflammatories (NSAID): Secondary | ICD-10-CM

## 2021-05-10 DIAGNOSIS — E860 Dehydration: Secondary | ICD-10-CM | POA: Diagnosis present

## 2021-05-10 DIAGNOSIS — I499 Cardiac arrhythmia, unspecified: Secondary | ICD-10-CM | POA: Diagnosis not present

## 2021-05-10 DIAGNOSIS — I361 Nonrheumatic tricuspid (valve) insufficiency: Secondary | ICD-10-CM | POA: Diagnosis not present

## 2021-05-10 DIAGNOSIS — R778 Other specified abnormalities of plasma proteins: Secondary | ICD-10-CM | POA: Diagnosis not present

## 2021-05-10 DIAGNOSIS — R220 Localized swelling, mass and lump, head: Secondary | ICD-10-CM | POA: Diagnosis not present

## 2021-05-10 DIAGNOSIS — T699XXA Effect of reduced temperature, unspecified, initial encounter: Secondary | ICD-10-CM | POA: Diagnosis not present

## 2021-05-10 DIAGNOSIS — I700 Atherosclerosis of aorta: Secondary | ICD-10-CM | POA: Diagnosis not present

## 2021-05-10 DIAGNOSIS — Z66 Do not resuscitate: Secondary | ICD-10-CM | POA: Diagnosis not present

## 2021-05-10 DIAGNOSIS — E861 Hypovolemia: Secondary | ICD-10-CM | POA: Diagnosis present

## 2021-05-10 DIAGNOSIS — J9602 Acute respiratory failure with hypercapnia: Secondary | ICD-10-CM | POA: Diagnosis not present

## 2021-05-10 DIAGNOSIS — I340 Nonrheumatic mitral (valve) insufficiency: Secondary | ICD-10-CM | POA: Diagnosis not present

## 2021-05-10 DIAGNOSIS — E876 Hypokalemia: Secondary | ICD-10-CM | POA: Diagnosis not present

## 2021-05-10 DIAGNOSIS — S40021A Contusion of right upper arm, initial encounter: Secondary | ICD-10-CM | POA: Diagnosis not present

## 2021-05-10 DIAGNOSIS — K269 Duodenal ulcer, unspecified as acute or chronic, without hemorrhage or perforation: Secondary | ICD-10-CM | POA: Diagnosis not present

## 2021-05-10 DIAGNOSIS — R404 Transient alteration of awareness: Secondary | ICD-10-CM | POA: Diagnosis not present

## 2021-05-10 DIAGNOSIS — R55 Syncope and collapse: Secondary | ICD-10-CM | POA: Diagnosis present

## 2021-05-10 DIAGNOSIS — E872 Acidosis, unspecified: Secondary | ICD-10-CM | POA: Diagnosis not present

## 2021-05-10 DIAGNOSIS — G9341 Metabolic encephalopathy: Secondary | ICD-10-CM | POA: Diagnosis present

## 2021-05-10 DIAGNOSIS — Z8249 Family history of ischemic heart disease and other diseases of the circulatory system: Secondary | ICD-10-CM

## 2021-05-10 DIAGNOSIS — I959 Hypotension, unspecified: Secondary | ICD-10-CM | POA: Diagnosis not present

## 2021-05-10 DIAGNOSIS — Z821 Family history of blindness and visual loss: Secondary | ICD-10-CM

## 2021-05-10 DIAGNOSIS — I350 Nonrheumatic aortic (valve) stenosis: Secondary | ICD-10-CM | POA: Diagnosis present

## 2021-05-10 DIAGNOSIS — K3189 Other diseases of stomach and duodenum: Secondary | ICD-10-CM | POA: Diagnosis not present

## 2021-05-10 DIAGNOSIS — R680 Hypothermia, not associated with low environmental temperature: Secondary | ICD-10-CM | POA: Diagnosis present

## 2021-05-10 DIAGNOSIS — L8990 Pressure ulcer of unspecified site, unspecified stage: Secondary | ICD-10-CM | POA: Diagnosis present

## 2021-05-10 DIAGNOSIS — R6889 Other general symptoms and signs: Secondary | ICD-10-CM | POA: Diagnosis not present

## 2021-05-10 DIAGNOSIS — R6510 Systemic inflammatory response syndrome (SIRS) of non-infectious origin without acute organ dysfunction: Secondary | ICD-10-CM | POA: Diagnosis not present

## 2021-05-10 DIAGNOSIS — T39395A Adverse effect of other nonsteroidal anti-inflammatory drugs [NSAID], initial encounter: Secondary | ICD-10-CM | POA: Diagnosis present

## 2021-05-10 DIAGNOSIS — Z79890 Hormone replacement therapy (postmenopausal): Secondary | ICD-10-CM

## 2021-05-10 DIAGNOSIS — S4991XA Unspecified injury of right shoulder and upper arm, initial encounter: Secondary | ICD-10-CM | POA: Diagnosis not present

## 2021-05-10 DIAGNOSIS — J90 Pleural effusion, not elsewhere classified: Secondary | ICD-10-CM | POA: Diagnosis not present

## 2021-05-10 DIAGNOSIS — Z825 Family history of asthma and other chronic lower respiratory diseases: Secondary | ICD-10-CM

## 2021-05-10 HISTORY — DX: Nonrheumatic aortic (valve) stenosis: I35.0

## 2021-05-10 HISTORY — DX: Gastrointestinal hemorrhage, unspecified: K92.2

## 2021-05-10 LAB — GLUCOSE, CAPILLARY
Glucose-Capillary: 80 mg/dL (ref 70–99)
Glucose-Capillary: 82 mg/dL (ref 70–99)
Glucose-Capillary: 85 mg/dL (ref 70–99)

## 2021-05-10 LAB — CBC WITH DIFFERENTIAL/PLATELET
Abs Immature Granulocytes: 0.17 10*3/uL — ABNORMAL HIGH (ref 0.00–0.07)
Basophils Absolute: 0 10*3/uL (ref 0.0–0.1)
Basophils Relative: 0 %
Eosinophils Absolute: 0 10*3/uL (ref 0.0–0.5)
Eosinophils Relative: 0 %
HCT: 22.2 % — ABNORMAL LOW (ref 36.0–46.0)
Hemoglobin: 8.2 g/dL — ABNORMAL LOW (ref 12.0–15.0)
Immature Granulocytes: 1 %
Lymphocytes Relative: 6 %
Lymphs Abs: 1 10*3/uL (ref 0.7–4.0)
MCH: 37.3 pg — ABNORMAL HIGH (ref 26.0–34.0)
MCHC: 36.9 g/dL — ABNORMAL HIGH (ref 30.0–36.0)
MCV: 100.9 fL — ABNORMAL HIGH (ref 80.0–100.0)
Monocytes Absolute: 1.2 10*3/uL — ABNORMAL HIGH (ref 0.1–1.0)
Monocytes Relative: 7 %
Neutro Abs: 14.4 10*3/uL — ABNORMAL HIGH (ref 1.7–7.7)
Neutrophils Relative %: 86 %
Platelets: 152 10*3/uL (ref 150–400)
RBC: 2.2 MIL/uL — ABNORMAL LOW (ref 3.87–5.11)
RDW: 14.6 % (ref 11.5–15.5)
WBC: 16.8 10*3/uL — ABNORMAL HIGH (ref 4.0–10.5)
nRBC: 0.1 % (ref 0.0–0.2)

## 2021-05-10 LAB — COMPREHENSIVE METABOLIC PANEL
ALT: 38 U/L (ref 0–44)
AST: 117 U/L — ABNORMAL HIGH (ref 15–41)
Albumin: 3.4 g/dL — ABNORMAL LOW (ref 3.5–5.0)
Alkaline Phosphatase: 113 U/L (ref 38–126)
Anion gap: 12 (ref 5–15)
BUN: 23 mg/dL (ref 8–23)
CO2: 20 mmol/L — ABNORMAL LOW (ref 22–32)
Calcium: 8.4 mg/dL — ABNORMAL LOW (ref 8.9–10.3)
Chloride: 82 mmol/L — ABNORMAL LOW (ref 98–111)
Creatinine, Ser: 0.97 mg/dL (ref 0.44–1.00)
GFR, Estimated: 59 mL/min — ABNORMAL LOW (ref >60)
Glucose, Bld: 96 mg/dL (ref 70–99)
Potassium: 3.3 mmol/L — ABNORMAL LOW (ref 3.5–5.1)
Sodium: 114 mmol/L — CL (ref 135–145)
Total Bilirubin: 3.3 mg/dL — ABNORMAL HIGH (ref 0.3–1.2)
Total Protein: 6.1 g/dL — ABNORMAL LOW (ref 6.5–8.1)

## 2021-05-10 LAB — SODIUM
Sodium: 114 mmol/L — CL (ref 135–145)
Sodium: 115 mmol/L — CL (ref 135–145)
Sodium: 116 mmol/L — CL (ref 135–145)
Sodium: 116 mmol/L — CL (ref 135–145)

## 2021-05-10 LAB — URINALYSIS, COMPLETE (UACMP) WITH MICROSCOPIC
Bilirubin Urine: NEGATIVE
Glucose, UA: NEGATIVE mg/dL
Ketones, ur: NEGATIVE mg/dL
Leukocytes,Ua: NEGATIVE
Nitrite: NEGATIVE
Protein, ur: 100 mg/dL — AB
Specific Gravity, Urine: 1.018 (ref 1.005–1.030)
pH: 5 (ref 5.0–8.0)

## 2021-05-10 LAB — CORTISOL: Cortisol, Plasma: 58.2 ug/dL

## 2021-05-10 LAB — TSH: TSH: 2.095 u[IU]/mL (ref 0.350–4.500)

## 2021-05-10 LAB — PROTIME-INR
INR: 1.4 — ABNORMAL HIGH (ref 0.8–1.2)
Prothrombin Time: 17.3 seconds — ABNORMAL HIGH (ref 11.4–15.2)

## 2021-05-10 LAB — BASIC METABOLIC PANEL
Anion gap: 10 (ref 5–15)
BUN: 23 mg/dL (ref 8–23)
CO2: 21 mmol/L — ABNORMAL LOW (ref 22–32)
Calcium: 8.5 mg/dL — ABNORMAL LOW (ref 8.9–10.3)
Chloride: 83 mmol/L — ABNORMAL LOW (ref 98–111)
Creatinine, Ser: 1.05 mg/dL — ABNORMAL HIGH (ref 0.44–1.00)
GFR, Estimated: 53 mL/min — ABNORMAL LOW (ref >60)
Glucose, Bld: 101 mg/dL — ABNORMAL HIGH (ref 70–99)
Potassium: 3.7 mmol/L (ref 3.5–5.1)
Sodium: 114 mmol/L — CL (ref 135–145)

## 2021-05-10 LAB — OSMOLALITY: Osmolality: 241 mOsm/kg — CL (ref 275–295)

## 2021-05-10 LAB — APTT: aPTT: 31 seconds (ref 24–36)

## 2021-05-10 LAB — BLOOD GAS, VENOUS
Acid-base deficit: 4.2 mmol/L — ABNORMAL HIGH (ref 0.0–2.0)
Bicarbonate: 21.6 mmol/L (ref 20.0–28.0)
O2 Saturation: 24.6 %
Patient temperature: 37
pCO2, Ven: 42 mmHg — ABNORMAL LOW (ref 44.0–60.0)
pH, Ven: 7.32 (ref 7.250–7.430)
pO2, Ven: <31 mmHg — CL (ref 32.0–45.0)

## 2021-05-10 LAB — CBG MONITORING, ED: Glucose-Capillary: 100 mg/dL — ABNORMAL HIGH (ref 70–99)

## 2021-05-10 LAB — TROPONIN I (HIGH SENSITIVITY): Troponin I (High Sensitivity): 42 ng/L — ABNORMAL HIGH (ref <18)

## 2021-05-10 LAB — RESP PANEL BY RT-PCR (FLU A&B, COVID) ARPGX2
Influenza A by PCR: NEGATIVE
Influenza B by PCR: NEGATIVE
SARS Coronavirus 2 by RT PCR: NEGATIVE

## 2021-05-10 LAB — SODIUM, URINE, RANDOM: Sodium, Ur: <10 mmol/L

## 2021-05-10 LAB — LACTIC ACID, PLASMA
Lactic Acid, Venous: 3.6 mmol/L (ref 0.5–1.9)
Lactic Acid, Venous: 2.8 mmol/L (ref 0.5–1.9)

## 2021-05-10 LAB — PROCALCITONIN: Procalcitonin: 0.16 ng/mL

## 2021-05-10 LAB — OSMOLALITY, URINE: Osmolality, Ur: 503 mOsm/kg (ref 300–900)

## 2021-05-10 LAB — T4, FREE: Free T4: 1.28 ng/dL — ABNORMAL HIGH (ref 0.61–1.12)

## 2021-05-10 LAB — CK: Total CK: 1181 U/L — ABNORMAL HIGH (ref 38–234)

## 2021-05-10 LAB — MRSA NEXT GEN BY PCR, NASAL: MRSA by PCR Next Gen: NOT DETECTED

## 2021-05-10 MED ORDER — CHLORHEXIDINE GLUCONATE CLOTH 2 % EX PADS
6.0000 | MEDICATED_PAD | Freq: Every day | CUTANEOUS | Status: DC
  Administered 2021-05-11 – 2021-05-23 (×9): 6 via TOPICAL

## 2021-05-10 MED ORDER — SODIUM CHLORIDE 0.9 % IV BOLUS
500.0000 mL | Freq: Once | INTRAVENOUS | Status: AC
  Administered 2021-05-10: 10:00:00 500 mL via INTRAVENOUS

## 2021-05-10 MED ORDER — ASPIRIN EC 325 MG PO TBEC
325.0000 mg | DELAYED_RELEASE_TABLET | Freq: Every day | ORAL | Status: DC
  Administered 2021-05-11: 10:00:00 325 mg via ORAL

## 2021-05-10 MED ORDER — SODIUM CHLORIDE 3 % IV SOLN
INTRAVENOUS | Status: DC
  Filled 2021-05-10 (×6): qty 500

## 2021-05-10 MED ORDER — LACTATED RINGERS IV BOLUS
1000.0000 mL | Freq: Once | INTRAVENOUS | Status: AC
  Administered 2021-05-10: 15:00:00 1000 mL via INTRAVENOUS

## 2021-05-10 MED ORDER — POLYETHYLENE GLYCOL 3350 17 G PO PACK: 17.0000 g | PACK | Freq: Every day | ORAL | Status: DC | PRN

## 2021-05-10 NOTE — ED Triage Notes (Signed)
Patient coming [facility] from home for unwitnessed fall. Patient reportedly fell once yesterday as well; EMS helped patient up, but patient refused transport to hospital for first fall. Patient reports down for second fall for 8 hours. Patient's friend reports she was unable to reach her since 1400 yesterday. Patient denies injury, however left face and right hand swollen.

## 2021-05-10 NOTE — ED Notes (Signed)
Patient hypotensive; BP taken multiple times in both arms. MD Aleskerov informed. MD ordered 1L bolus of LR. LR administered.

## 2021-05-10 NOTE — Plan of Care (Signed)

## 2021-05-10 NOTE — ED Notes (Signed)
Patient transported to CT 

## 2021-05-10 NOTE — H&P (Signed)
CRITICAL CARE PROGRESS NOTE    Name: Angela Sullivan MRN: [id number] DOB: [birth year]     LOS: 0   SUBJECTIVE FINDINGS & SIGNIFICANT EVENTS    Patient description:   79 yo with PMH as below came in after being found down in cold interior.  Found to have hypothermia 24F and hyponatremia 114 with confusion. In ER nephrology was consulted and recommendation for 3NS at 30cc/hr. On interview patient seems uncomfortable but clinically improved.   Lines/tubes : Urethral Catheter Eileen Stanford, RN Temperature probe (Active)    Microbiology/Sepsis markers: Results for orders placed or performed during the hospital encounter of 05/22/2021  Resp Panel by RT-PCR (Flu A&B, Covid)     Status: None   Collection Time: 05/21/2021  9:33 AM   Specimen: Nasopharyngeal(NP) swabs in vial transport medium  Result Value Ref Range Status   SARS Coronavirus 2 by RT PCR NEGATIVE NEGATIVE Final    Comment: (NOTE) SARS-CoV-2 target nucleic acids are NOT DETECTED.  The SARS-CoV-2 RNA is generally detectable in upper respiratory specimens during the acute phase of infection. The lowest concentration of SARS-CoV-2 viral copies this assay can detect is 138 copies/mL. A negative result does not preclude SARS-Cov-2 infection and should not be used as the sole basis for treatment or other patient management decisions. A negative result may occur with  improper specimen collection/handling, submission of specimen other than nasopharyngeal swab, presence of viral mutation(s) within the areas targeted by this assay, and inadequate number of viral copies(<138 copies/mL). A negative result must be combined with clinical observations, patient history, and epidemiological information. The expected result is Negative.  Fact Sheet for Patients:   BloggerCourse.com  Fact Sheet for Healthcare Providers:  SeriousBroker.it  This test is no t yet approved or cleared by the [place] FDA and  has been authorized for detection and/or diagnosis of SARS-CoV-2 by FDA under an Emergency Use Authorization (EUA). This EUA will remain  in effect (meaning this test can be used) for the duration of the COVID-19 declaration under Section 564(b)(1) of the Act, 21 U.S.C.section 360bbb-3(b)(1), unless the authorization is terminated  or revoked sooner.       Influenza A by PCR NEGATIVE NEGATIVE Final   Influenza B by PCR NEGATIVE NEGATIVE Final    Comment: (NOTE) The Xpert Xpress SARS-CoV-2/FLU/RSV plus assay is intended as an aid in the diagnosis of influenza from Nasopharyngeal swab specimens and should not be used as a sole basis for treatment. Nasal washings and aspirates are unacceptable for Xpert Xpress SARS-CoV-2/FLU/RSV testing.  Fact Sheet for Patients: BloggerCourse.com  Fact Sheet for Healthcare Providers: SeriousBroker.it  This test is not yet approved or cleared by the [place] FDA and has been authorized for detection and/or diagnosis of SARS-CoV-2 by FDA under an Emergency Use Authorization (EUA). This EUA will remain in effect (meaning this test can be used) for the duration of the COVID-19 declaration under Section 564(b)(1) of the Act, 21 U.S.C. section 360bbb-3(b)(1), unless the authorization is terminated or revoked.  Performed at [facility], [address] [place], [place] [address]     Anti-infectives:  Anti-infectives (From admission, onward)    None       PAST MEDICAL HISTORY   Past Medical History:  Diagnosis Date   Anxiety    Gout    High cholesterol    Hypertension      SURGICAL HISTORY   Past Surgical History:  Procedure Laterality Date   DILATION AND CURETTAGE OF  UTERUS  FAMILY HISTORY   Family History  Problem Relation Age of Onset   Heart disease Father    COPD Neg Hx    Diabetes Mellitus II Neg Hx      SOCIAL HISTORY   Social History   Tobacco Use   Smoking status: Never   Smokeless tobacco: Never  Vaping Use   Vaping Use: Never used  Substance Use Topics   Alcohol use: Yes    Comment: occasional   Drug use: No     MEDICATIONS   Current Medication: No current facility-administered medications for this encounter.  Current Outpatient Medications:    Cholecalciferol 1000 units tablet, Take 5,000 Units by mouth daily., Disp: , Rfl:    indomethacin (INDOCIN) 25 MG capsule, Take 25 mg by mouth every 6 (six) hours as needed., Disp: , Rfl:    lisinopril (PRINIVIL,ZESTRIL) 40 MG tablet, Take 1 tablet (40 mg total) by mouth daily., Disp: 30 tablet, Rfl: 1   medroxyPROGESTERone (PROVERA) 10 MG tablet, Take 1 tablet (10 mg total) by mouth daily., Disp: 90 tablet, Rfl: 4   Melatonin 5 MG TABS, Take 2 tablets by mouth at bedtime., Disp: , Rfl:    meloxicam (MOBIC) 7.5 MG tablet, Take 7.5 mg by mouth 2 (two) times daily., Disp: , Rfl:    omega-3 acid ethyl esters (LOVAZA) 1 g capsule, Take 1 g by mouth daily., Disp: , Rfl:    pravastatin (PRAVACHOL) 20 MG tablet, Take 20 mg by mouth at bedtime., Disp: , Rfl:     ALLERGIES   Patient has no known allergies.    REVIEW OF SYSTEMS     10 point ros conducted and is negative except as per subjective findings.   PHYSICAL EXAMINATION   Vital Signs: Temp:  [93.6 F (34.2 C)-93.7 F (34.3 C)] 93.7 F (34.3 C) (12/18 1100) Pulse Rate:  [65-81] 68 (12/18 1100) Resp:  [16-27] 16 (12/18 1100) BP: (103-147)/(70-96) 103/70 (12/18 1100) SpO2:  [93 %-100 %] 100 % (12/18 1100) Weight:  [86.9 kg] 86.9 kg (12/18 0923)  GENERAL:Mild distress  HEAD: Normocephalic, atraumatic.  EYES: Pupils equal, round, reactive to light.  No scleral icterus.  MOUTH: Moist mucosal membrane. NECK:  Supple. No thyromegaly. No nodules. No JVD.  PULMONARY: rhonchi CARDIOVASCULAR: S1 and S2. Regular rate and rhythm. No murmurs, rubs, or gallops.  GASTROINTESTINAL: Soft, nontender, non-distended. No masses. Positive bowel sounds. No hepatosplenomegaly.  MUSCULOSKELETAL: No swelling, clubbing, or edema.  NEUROLOGIC: Mild distress due to acute illness SKIN:intact,warm,dry   PERTINENT DATA     Infusions:  Scheduled Medications:  PRN Medications:  Hemodynamic parameters:   Intake/Output: No intake/output data recorded.  Ventilator  Settings:      LAB RESULTS:  Basic Metabolic Panel: Recent Labs  Lab 05/17/2021 0928  NA 114*  K 3.3*  CL 82*  CO2 20*  GLUCOSE 96  BUN 23  CREATININE 0.97  CALCIUM 8.4*   Liver Function Tests: Recent Labs  Lab 05/16/2021 0928  AST 117*  ALT 38  ALKPHOS 113  BILITOT 3.3*  PROT 6.1*  ALBUMIN 3.4*   No results for input(s): LIPASE, AMYLASE in the last 168 hours. No results for input(s): AMMONIA in the last 168 hours. CBC: Recent Labs  Lab 05/07/2021 0928  WBC 16.8*  NEUTROABS 14.4*  HGB 8.2*  HCT 22.2*  MCV 100.9*  PLT 152   Cardiac Enzymes: Recent Labs  Lab 05/01/2021 0928  CKTOTAL 1,181*   BNP: Invalid input(s): POCBNP CBG: Recent Labs  Lab 05/18/2021  1610  GLUCAP 100*       IMAGING RESULTS:  Imaging: DG Forearm Right  Result Date: 04/25/2021 CLINICAL DATA:  Patient coming [facility] from home for unwitnessed fall. Patient reportedly fell once yesterday as well; EMS helped patient up, but patient refused transport to hospital for first fall. Patient reports down for second fall for 8 hours. Pain and bruising down right arm and hip. Pt unable to verbalize coherently at this point, unable to obtain history. EXAM: RIGHT FOREARM - 2 VIEW COMPARISON:  None. FINDINGS: No fracture or bone lesion. Elbow and wrist joints are normally aligned. There is diffuse subcutaneous soft tissue edema, nonspecific. IMPRESSION: No  fracture or dislocation. Electronically Signed   By: Amie Portland M.D.   On: 04/23/2021 10:52   CT HEAD WO CONTRAST ( )  Result Date: 05/05/2021 CLINICAL DATA:  Fall.  Periorbital swelling.  Neck pain. EXAM: CT HEAD WITHOUT CONTRAST CT MAXILLOFACIAL WITHOUT CONTRAST CT CERVICAL SPINE WITHOUT CONTRAST TECHNIQUE: Multidetector CT imaging of the head, cervical spine, and maxillofacial structures were performed using the standard protocol without intravenous contrast. Multiplanar CT image reconstructions of the cervical spine and maxillofacial structures were also generated. COMPARISON:  None. FINDINGS: CT HEAD FINDINGS Brain: No evidence of acute infarction, hemorrhage, hydrocephalus, extra-axial collection or mass lesion/mass effect. Patchy white matter hypoattenuation noted bilaterally consistent with mild chronic microvascular ischemic change. Vascular: No hyperdense vessel or unexpected calcification. Skull: Normal. Negative for fracture or focal lesion. Other: None. CT MAXILLOFACIAL FINDINGS Osseous: No fracture or mandibular dislocation. No destructive process. Orbits: Right preseptal periorbital soft tissue swelling/hemorrhage. No injury to the right globe or postseptal orbit. Normal left globe and orbit. Sinuses: Minimal dependent fluid in the left maxillary sinus. Remaining sinuses are clear. Clear mastoid air cells and middle ear cavities. Soft tissues: Soft tissue swelling extends from the right periorbital region across the right cheek. CT CERVICAL SPINE FINDINGS Alignment: Straightened cervical lordosis.  No spondylolisthesis. Skull base and vertebrae: No acute fracture. No primary bone lesion or focal pathologic process. Soft tissues and spinal canal: No prevertebral fluid or swelling. No visible canal hematoma. Disc levels: Mild loss of disc height at C4-C5. Moderate loss of disc height at C5-C6 and C6-C7. Mild spondylotic disc bulging with endplate spurring at these levels. No convincing disc  herniation. Bilateral facet degenerative changes, greater on the left. Upper chest: No acute findings. Partly calcified apical pleuroparenchymal scarring. Other: None. IMPRESSION: HEAD CT 1. No acute intracranial abnormalities.  No skull fracture. MAXILLOFACIAL CT 1. No fractures. 2. Right preseptal periorbital soft tissue swelling/hemorrhage extending across the right cheek. No injury to the right globe or postseptal orbit. CERVICAL CT 1. No fracture or acute finding. Electronically Signed   By: Amie Portland M.D.   On: 04/23/2021 10:19   CT Cervical Spine Wo Contrast  Result Date: 04/28/2021 CLINICAL DATA:  Fall.  Periorbital swelling.  Neck pain. EXAM: CT HEAD WITHOUT CONTRAST CT MAXILLOFACIAL WITHOUT CONTRAST CT CERVICAL SPINE WITHOUT CONTRAST TECHNIQUE: Multidetector CT imaging of the head, cervical spine, and maxillofacial structures were performed using the standard protocol without intravenous contrast. Multiplanar CT image reconstructions of the cervical spine and maxillofacial structures were also generated. COMPARISON:  None. FINDINGS: CT HEAD FINDINGS Brain: No evidence of acute infarction, hemorrhage, hydrocephalus, extra-axial collection or mass lesion/mass effect. Patchy white matter hypoattenuation noted bilaterally consistent with mild chronic microvascular ischemic change. Vascular: No hyperdense vessel or unexpected calcification. Skull: Normal. Negative for fracture or focal lesion. Other: None. CT MAXILLOFACIAL FINDINGS Osseous: No  fracture or mandibular dislocation. No destructive process. Orbits: Right preseptal periorbital soft tissue swelling/hemorrhage. No injury to the right globe or postseptal orbit. Normal left globe and orbit. Sinuses: Minimal dependent fluid in the left maxillary sinus. Remaining sinuses are clear. Clear mastoid air cells and middle ear cavities. Soft tissues: Soft tissue swelling extends from the right periorbital region across the right cheek. CT CERVICAL SPINE  FINDINGS Alignment: Straightened cervical lordosis.  No spondylolisthesis. Skull base and vertebrae: No acute fracture. No primary bone lesion or focal pathologic process. Soft tissues and spinal canal: No prevertebral fluid or swelling. No visible canal hematoma. Disc levels: Mild loss of disc height at C4-C5. Moderate loss of disc height at C5-C6 and C6-C7. Mild spondylotic disc bulging with endplate spurring at these levels. No convincing disc herniation. Bilateral facet degenerative changes, greater on the left. Upper chest: No acute findings. Partly calcified apical pleuroparenchymal scarring. Other: None. IMPRESSION: HEAD CT 1. No acute intracranial abnormalities.  No skull fracture. MAXILLOFACIAL CT 1. No fractures. 2. Right preseptal periorbital soft tissue swelling/hemorrhage extending across the right cheek. No injury to the right globe or postseptal orbit. CERVICAL CT 1. No fracture or acute finding. Electronically Signed   By: Amie Portland M.D.   On: 05/14/2021 10:19   DG Hand 2 View Right  Result Date: 04/26/2021 CLINICAL DATA:  Patient coming [facility] from home for unwitnessed fall. Patient reportedly fell once yesterday as well; EMS helped patient up, but patient refused transport to hospital for first fall. Patient reports down for second fall for 8 hours. Pain and bruising down right arm and hip. Pt unable to verbalize coherently at this point, unable to obtain history. EXAM: RIGHT HAND - 2 VIEW COMPARISON:  None. FINDINGS: No fracture or bone lesion. Arthropathic changes involving multiple joints, most prominently the DIP joint of the middle finger, pattern consistent with osteoarthritis. Joints are normally aligned. Soft tissues are unremarkable. IMPRESSION: 1. No fracture or dislocation. 2. Osteoarthritis. Electronically Signed   By: Amie Portland M.D.   On: 05/02/2021 10:53   DG Chest Port 1 View  Result Date: 05/20/2021 CLINICAL DATA:  Patient coming [facility] from home for unwitnessed  fall. Patient reportedly fell once yesterday as well; EMS helped patient up, but patient refused transport to hospital for first fall. Patient reports down for second fall for 8 hours. Pain and bruising down right arm and hip. Pt unable to verbalize coherently at this point, unable to obtain history. EXAM: PORTABLE CHEST 1 VIEW COMPARISON:  03/21/2017 FINDINGS: Mild enlargement of the cardiopericardial silhouette. No mediastinal or hilar masses. Clear lungs.  No convincing pleural effusion or pneumothorax. Skeletal structures are grossly intact. IMPRESSION: No active disease. Electronically Signed   By: Amie Portland M.D.   On: 05/17/2021 10:50   DG Humerus Right  Result Date: 04/28/2021 CLINICAL DATA:  Patient coming [facility] from home for unwitnessed fall. Patient reportedly fell once yesterday as well; EMS helped patient up, but patient refused transport to hospital for first fall. Patient reports down for second fall for 8 hours. Pain and bruising down right arm and hip. Pt unable to verbalize coherently at this point, unable to obtain history. EXAM: RIGHT HUMERUS - 2+ VIEW COMPARISON:  None. FINDINGS: No fracture or bone lesion. Glenohumeral and elbow joints are normally aligned. Soft tissues are unremarkable. IMPRESSION: No fracture or dislocation. Electronically Signed   By: Amie Portland M.D.   On: 05/07/2021 10:51   DG HIP UNILAT WITH PELVIS 2-3 VIEWS RIGHT  Result Date: 04/23/2021 CLINICAL DATA:  Patient coming [facility] from home for unwitnessed fall. Patient reportedly fell once yesterday as well; EMS helped patient up, but patient refused transport to hospital for first fall. Patient reports down for second fall for 8 hours. Pain and bruising down right arm and hip. Pt unable to verbalize coherently at this point, unable to obtain history. EXAM: DG HIP (WITH OR WITHOUT PELVIS) 2-3V RIGHT COMPARISON:  None. FINDINGS: No fracture or bone lesion. Hip joints, SI joints and symphysis pubis are normally  aligned. Soft tissues are unremarkable. IMPRESSION: 1. No fracture or dislocation. Electronically Signed   By: Amie Portland M.D.   On: 04/26/2021 10:50   CT Maxillofacial Wo Contrast  Result Date: 05/23/2021 CLINICAL DATA:  Fall.  Periorbital swelling.  Neck pain. EXAM: CT HEAD WITHOUT CONTRAST CT MAXILLOFACIAL WITHOUT CONTRAST CT CERVICAL SPINE WITHOUT CONTRAST TECHNIQUE: Multidetector CT imaging of the head, cervical spine, and maxillofacial structures were performed using the standard protocol without intravenous contrast. Multiplanar CT image reconstructions of the cervical spine and maxillofacial structures were also generated. COMPARISON:  None. FINDINGS: CT HEAD FINDINGS Brain: No evidence of acute infarction, hemorrhage, hydrocephalus, extra-axial collection or mass lesion/mass effect. Patchy white matter hypoattenuation noted bilaterally consistent with mild chronic microvascular ischemic change. Vascular: No hyperdense vessel or unexpected calcification. Skull: Normal. Negative for fracture or focal lesion. Other: None. CT MAXILLOFACIAL FINDINGS Osseous: No fracture or mandibular dislocation. No destructive process. Orbits: Right preseptal periorbital soft tissue swelling/hemorrhage. No injury to the right globe or postseptal orbit. Normal left globe and orbit. Sinuses: Minimal dependent fluid in the left maxillary sinus. Remaining sinuses are clear. Clear mastoid air cells and middle ear cavities. Soft tissues: Soft tissue swelling extends from the right periorbital region across the right cheek. CT CERVICAL SPINE FINDINGS Alignment: Straightened cervical lordosis.  No spondylolisthesis. Skull base and vertebrae: No acute fracture. No primary bone lesion or focal pathologic process. Soft tissues and spinal canal: No prevertebral fluid or swelling. No visible canal hematoma. Disc levels: Mild loss of disc height at C4-C5. Moderate loss of disc height at C5-C6 and C6-C7. Mild spondylotic disc bulging  with endplate spurring at these levels. No convincing disc herniation. Bilateral facet degenerative changes, greater on the left. Upper chest: No acute findings. Partly calcified apical pleuroparenchymal scarring. Other: None. IMPRESSION: HEAD CT 1. No acute intracranial abnormalities.  No skull fracture. MAXILLOFACIAL CT 1. No fractures. 2. Right preseptal periorbital soft tissue swelling/hemorrhage extending across the right cheek. No injury to the right globe or postseptal orbit. CERVICAL CT 1. No fracture or acute finding. Electronically Signed   By: Amie Portland M.D.   On: 05/01/2021 10:19   @ DG Forearm Right  Result Date: 05/09/2021 CLINICAL DATA:  Patient coming [facility] from home for unwitnessed fall. Patient reportedly fell once yesterday as well; EMS helped patient up, but patient refused transport to hospital for first fall. Patient reports down for second fall for 8 hours. Pain and bruising down right arm and hip. Pt unable to verbalize coherently at this point, unable to obtain history. EXAM: RIGHT FOREARM - 2 VIEW COMPARISON:  None. FINDINGS: No fracture or bone lesion. Elbow and wrist joints are normally aligned. There is diffuse subcutaneous soft tissue edema, nonspecific. IMPRESSION: No fracture or dislocation. Electronically Signed   By: Amie Portland M.D.   On: 05/18/2021 10:52   CT HEAD WO CONTRAST ( )  Result Date: 04/23/2021 CLINICAL DATA:  Fall.  Periorbital swelling.  Neck pain. EXAM: CT  HEAD WITHOUT CONTRAST CT MAXILLOFACIAL WITHOUT CONTRAST CT CERVICAL SPINE WITHOUT CONTRAST TECHNIQUE: Multidetector CT imaging of the head, cervical spine, and maxillofacial structures were performed using the standard protocol without intravenous contrast. Multiplanar CT image reconstructions of the cervical spine and maxillofacial structures were also generated. COMPARISON:  None. FINDINGS: CT HEAD FINDINGS Brain: No evidence of acute infarction, hemorrhage, hydrocephalus, extra-axial  collection or mass lesion/mass effect. Patchy white matter hypoattenuation noted bilaterally consistent with mild chronic microvascular ischemic change. Vascular: No hyperdense vessel or unexpected calcification. Skull: Normal. Negative for fracture or focal lesion. Other: None. CT MAXILLOFACIAL FINDINGS Osseous: No fracture or mandibular dislocation. No destructive process. Orbits: Right preseptal periorbital soft tissue swelling/hemorrhage. No injury to the right globe or postseptal orbit. Normal left globe and orbit. Sinuses: Minimal dependent fluid in the left maxillary sinus. Remaining sinuses are clear. Clear mastoid air cells and middle ear cavities. Soft tissues: Soft tissue swelling extends from the right periorbital region across the right cheek. CT CERVICAL SPINE FINDINGS Alignment: Straightened cervical lordosis.  No spondylolisthesis. Skull base and vertebrae: No acute fracture. No primary bone lesion or focal pathologic process. Soft tissues and spinal canal: No prevertebral fluid or swelling. No visible canal hematoma. Disc levels: Mild loss of disc height at C4-C5. Moderate loss of disc height at C5-C6 and C6-C7. Mild spondylotic disc bulging with endplate spurring at these levels. No convincing disc herniation. Bilateral facet degenerative changes, greater on the left. Upper chest: No acute findings. Partly calcified apical pleuroparenchymal scarring. Other: None. IMPRESSION: HEAD CT 1. No acute intracranial abnormalities.  No skull fracture. MAXILLOFACIAL CT 1. No fractures. 2. Right preseptal periorbital soft tissue swelling/hemorrhage extending across the right cheek. No injury to the right globe or postseptal orbit. CERVICAL CT 1. No fracture or acute finding. Electronically Signed   By: Amie Portland M.D.   On: 05/08/2021 10:19   CT Cervical Spine Wo Contrast  Result Date: 05/02/2021 CLINICAL DATA:  Fall.  Periorbital swelling.  Neck pain. EXAM: CT HEAD WITHOUT CONTRAST CT MAXILLOFACIAL  WITHOUT CONTRAST CT CERVICAL SPINE WITHOUT CONTRAST TECHNIQUE: Multidetector CT imaging of the head, cervical spine, and maxillofacial structures were performed using the standard protocol without intravenous contrast. Multiplanar CT image reconstructions of the cervical spine and maxillofacial structures were also generated. COMPARISON:  None. FINDINGS: CT HEAD FINDINGS Brain: No evidence of acute infarction, hemorrhage, hydrocephalus, extra-axial collection or mass lesion/mass effect. Patchy white matter hypoattenuation noted bilaterally consistent with mild chronic microvascular ischemic change. Vascular: No hyperdense vessel or unexpected calcification. Skull: Normal. Negative for fracture or focal lesion. Other: None. CT MAXILLOFACIAL FINDINGS Osseous: No fracture or mandibular dislocation. No destructive process. Orbits: Right preseptal periorbital soft tissue swelling/hemorrhage. No injury to the right globe or postseptal orbit. Normal left globe and orbit. Sinuses: Minimal dependent fluid in the left maxillary sinus. Remaining sinuses are clear. Clear mastoid air cells and middle ear cavities. Soft tissues: Soft tissue swelling extends from the right periorbital region across the right cheek. CT CERVICAL SPINE FINDINGS Alignment: Straightened cervical lordosis.  No spondylolisthesis. Skull base and vertebrae: No acute fracture. No primary bone lesion or focal pathologic process. Soft tissues and spinal canal: No prevertebral fluid or swelling. No visible canal hematoma. Disc levels: Mild loss of disc height at C4-C5. Moderate loss of disc height at C5-C6 and C6-C7. Mild spondylotic disc bulging with endplate spurring at these levels. No convincing disc herniation. Bilateral facet degenerative changes, greater on the left. Upper chest: No acute findings. Partly calcified apical pleuroparenchymal  scarring. Other: None. IMPRESSION: HEAD CT 1. No acute intracranial abnormalities.  No skull fracture.  MAXILLOFACIAL CT 1. No fractures. 2. Right preseptal periorbital soft tissue swelling/hemorrhage extending across the right cheek. No injury to the right globe or postseptal orbit. CERVICAL CT 1. No fracture or acute finding. Electronically Signed   By: Amie Portland M.D.   On: 05/04/2021 10:19   DG Hand 2 View Right  Result Date: 05/16/2021 CLINICAL DATA:  Patient coming [facility] from home for unwitnessed fall. Patient reportedly fell once yesterday as well; EMS helped patient up, but patient refused transport to hospital for first fall. Patient reports down for second fall for 8 hours. Pain and bruising down right arm and hip. Pt unable to verbalize coherently at this point, unable to obtain history. EXAM: RIGHT HAND - 2 VIEW COMPARISON:  None. FINDINGS: No fracture or bone lesion. Arthropathic changes involving multiple joints, most prominently the DIP joint of the middle finger, pattern consistent with osteoarthritis. Joints are normally aligned. Soft tissues are unremarkable. IMPRESSION: 1. No fracture or dislocation. 2. Osteoarthritis. Electronically Signed   By: Amie Portland M.D.   On: 05/14/2021 10:53   DG Chest Port 1 View  Result Date: 05/19/2021 CLINICAL DATA:  Patient coming [facility] from home for unwitnessed fall. Patient reportedly fell once yesterday as well; EMS helped patient up, but patient refused transport to hospital for first fall. Patient reports down for second fall for 8 hours. Pain and bruising down right arm and hip. Pt unable to verbalize coherently at this point, unable to obtain history. EXAM: PORTABLE CHEST 1 VIEW COMPARISON:  03/21/2017 FINDINGS: Mild enlargement of the cardiopericardial silhouette. No mediastinal or hilar masses. Clear lungs.  No convincing pleural effusion or pneumothorax. Skeletal structures are grossly intact. IMPRESSION: No active disease. Electronically Signed   By: Amie Portland M.D.   On: 05/07/2021 10:50   DG Humerus Right  Result Date:  04/24/2021 CLINICAL DATA:  Patient coming [facility] from home for unwitnessed fall. Patient reportedly fell once yesterday as well; EMS helped patient up, but patient refused transport to hospital for first fall. Patient reports down for second fall for 8 hours. Pain and bruising down right arm and hip. Pt unable to verbalize coherently at this point, unable to obtain history. EXAM: RIGHT HUMERUS - 2+ VIEW COMPARISON:  None. FINDINGS: No fracture or bone lesion. Glenohumeral and elbow joints are normally aligned. Soft tissues are unremarkable. IMPRESSION: No fracture or dislocation. Electronically Signed   By: Amie Portland M.D.   On: 05/22/2021 10:51   DG HIP UNILAT WITH PELVIS 2-3 VIEWS RIGHT  Result Date: 05/09/2021 CLINICAL DATA:  Patient coming [facility] from home for unwitnessed fall. Patient reportedly fell once yesterday as well; EMS helped patient up, but patient refused transport to hospital for first fall. Patient reports down for second fall for 8 hours. Pain and bruising down right arm and hip. Pt unable to verbalize coherently at this point, unable to obtain history. EXAM: DG HIP (WITH OR WITHOUT PELVIS) 2-3V RIGHT COMPARISON:  None. FINDINGS: No fracture or bone lesion. Hip joints, SI joints and symphysis pubis are normally aligned. Soft tissues are unremarkable. IMPRESSION: 1. No fracture or dislocation. Electronically Signed   By: Amie Portland M.D.   On: 05/03/2021 10:50   CT Maxillofacial Wo Contrast  Result Date: 05/08/2021 CLINICAL DATA:  Fall.  Periorbital swelling.  Neck pain. EXAM: CT HEAD WITHOUT CONTRAST CT MAXILLOFACIAL WITHOUT CONTRAST CT CERVICAL SPINE WITHOUT CONTRAST TECHNIQUE: Multidetector CT imaging  of the head, cervical spine, and maxillofacial structures were performed using the standard protocol without intravenous contrast. Multiplanar CT image reconstructions of the cervical spine and maxillofacial structures were also generated. COMPARISON:  None. FINDINGS: CT HEAD FINDINGS  Brain: No evidence of acute infarction, hemorrhage, hydrocephalus, extra-axial collection or mass lesion/mass effect. Patchy white matter hypoattenuation noted bilaterally consistent with mild chronic microvascular ischemic change. Vascular: No hyperdense vessel or unexpected calcification. Skull: Normal. Negative for fracture or focal lesion. Other: None. CT MAXILLOFACIAL FINDINGS Osseous: No fracture or mandibular dislocation. No destructive process. Orbits: Right preseptal periorbital soft tissue swelling/hemorrhage. No injury to the right globe or postseptal orbit. Normal left globe and orbit. Sinuses: Minimal dependent fluid in the left maxillary sinus. Remaining sinuses are clear. Clear mastoid air cells and middle ear cavities. Soft tissues: Soft tissue swelling extends from the right periorbital region across the right cheek. CT CERVICAL SPINE FINDINGS Alignment: Straightened cervical lordosis.  No spondylolisthesis. Skull base and vertebrae: No acute fracture. No primary bone lesion or focal pathologic process. Soft tissues and spinal canal: No prevertebral fluid or swelling. No visible canal hematoma. Disc levels: Mild loss of disc height at C4-C5. Moderate loss of disc height at C5-C6 and C6-C7. Mild spondylotic disc bulging with endplate spurring at these levels. No convincing disc herniation. Bilateral facet degenerative changes, greater on the left. Upper chest: No acute findings. Partly calcified apical pleuroparenchymal scarring. Other: None. IMPRESSION: HEAD CT 1. No acute intracranial abnormalities.  No skull fracture. MAXILLOFACIAL CT 1. No fractures. 2. Right preseptal periorbital soft tissue swelling/hemorrhage extending across the right cheek. No injury to the right globe or postseptal orbit. CERVICAL CT 1. No fracture or acute finding. Electronically Signed   By: Amie Portland M.D.   On: 05/09/2021 10:19     ASSESSMENT AND PLAN    -Multidisciplinary rounds held today  Acute Mental  status with encephalopathy    - likely due to hyponatremia    - s/p nephrology evaluation  3%ns initiated   Hypothermia 13F -external warming Bare hugger. -neurochecks -PT/INR for coagulopathy    ID -continue IV abx as prescibed -follow up cultures  GI/Nutrition GI PROPHYLAXIS as indicated DIET-->TF's as tolerated Constipation protocol as indicated  ENDO - ICU hypoglycemic\Hyperglycemia protocol -check FSBS per protocol   ELECTROLYTES -follow labs as needed -replace as needed -pharmacy consultation   DVT/GI PRX ordered -SCDs  TRANSFUSIONS AS NEEDED MONITOR FSBS ASSESS the need for LABS as needed  Critical care provider statement:   Total critical care time: 33 minutes   Performed by: Karna Christmas MD   Critical care time was exclusive of separately billable procedures and treating other patients.   Critical care was necessary to treat or prevent imminent or life-threatening deterioration.   Critical care was time spent personally by me on the following activities: development of treatment plan with patient and/or surrogate as well as nursing, discussions with consultants, evaluation of patient's response to treatment, examination of patient, obtaining history from patient or surrogate, ordering and performing treatments and interventions, ordering and review of laboratory studies, ordering and review of radiographic studies, pulse oximetry and re-evaluation of patient's condition.    Vida Rigger, M.D.  Pulmonary & Critical Care Medicine       Vida Rigger, M.D.  Division of Pulmonary & Critical Care Medicine  [facility]

## 2021-05-10 NOTE — ED Provider Notes (Signed)
[facility] Emergency Department Provider Note   ____________________________________________   Event Date/Time   First MD Initiated Contact with Patient 05/14/2021 [phone]     (approximate)  I have reviewed the triage vital signs and the nursing notes.   HISTORY  Chief Complaint Fall  EM caveat: Some limitation, patient slightly disoriented  HPI Unique Sillas is a 80 y.o. female reports she fell probably 8 or more hours ago.  She is not sure what caused her to fall but has not been feeling well for about 2 to 3 weeks  She denies that she feels that she is in pain or that she got injured from the fall.  Not sure if she passed out.  She laid on the floor for several hours.  No chest pain no trouble breathing.  She does feel cool and chilled.  No headache.  No neck pain.  No nausea or vomiting.  Denies any specific feelings of illness like a cough, shortness of breath, or trouble urinating or painful urination  She does feel like her right arm and hand are swollen after she laid on them for several hours on the cold floor  Past Medical History:  Diagnosis Date   Anxiety    Gout    High cholesterol    Hypertension     Patient Active Problem List   Diagnosis Date Noted   Hypothermia 04/23/2021   High cholesterol    Hypertension    Fall at home, initial encounter    New onset atrial fibrillation (HCC)    Elevated troponin    Abnormal LFTs    Rhabdomyolysis    Hypokalemia    SIRS (systemic inflammatory response syndrome) (HCC)    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 Authorizing Provider  Cholecalciferol 1000 units tablet Take 5,000 Units by mouth daily.    [provider]  indomethacin (INDOCIN) 25 MG capsule Take 25 mg by mouth  every 6 (six) hours as needed.    [provider]  lisinopril (PRINIVIL,ZESTRIL) 40 MG tablet Take 1 tablet (40 mg total) by mouth daily. 03/26/17 03/26/18  Houston Siren, MD  medroxyPROGESTERone (PROVERA) 10 MG tablet Take 1 tablet (10 mg total) by mouth daily. 07/29/20 10/22/21  Schuman, Jaquelyn Bitter, MD  Melatonin 5 MG TABS Take 2 tablets by mouth at bedtime.    [provider]  meloxicam (MOBIC) 7.5 MG tablet Take 7.5 mg by mouth 2 (two) times daily.    [provider]  omega-3 acid ethyl esters (LOVAZA) 1 g capsule Take 1 g by mouth daily.    [provider]  pravastatin (PRAVACHOL) 20 MG tablet Take 20 mg by mouth at bedtime.    [provider]    Allergies Patient has no known allergies.  Family History  Problem Relation Age of Onset   Heart disease Father    COPD Neg Hx    Diabetes Mellitus II Neg Hx     Social History Social History   Tobacco Use   Smoking status: Never   Smokeless tobacco: Never  Vaping Use   Vaping Use: Never used  Substance Use Topics   Alcohol use: Yes    Comment: occasional   Drug use: No    Review of Systems Constitutional: No  fever feels chilled and cold Eyes: No visual changes.  He is slightly swollen around the skin of the eyes ENT: No sore throat.  No neck pain Cardiovascular: Denies chest pain. Respiratory: Denies shortness of breath. Gastrointestinal: No abdominal pain.   Genitourinary: Negative for dysuria. Musculoskeletal: Negative for back pain.  Feels like her arm is swollen after laying on it for several hours particularly right arm.  Also fell and bruised the right hip area, also had a fall the day prior Skin: Negative for rash except some bruising after a fall with right hip.  Denies the right hip is painful though. Neurological: Negative for headaches, areas of focal weakness or numbness.  Feels fatigued    ____________________________________________   PHYSICAL EXAM:  VITAL  SIGNS: ED Triage Vitals [05/20/2021 0923]  Enc Vitals Group     BP      Pulse      Resp      Temp      Temp src      SpO2      Weight 191 lb 9.3 oz (86.9 kg)     Height  (1.575 m)     Head Circumference      Peak Flow      Pain Score 0     Pain Loc      Pain Edu?      Excl. in GC?     Constitutional: Alert and oriented to self year but reports the month to be January.  Fatigued slightly pale in complexion but in no acute distress.   Eyes: Conjunctivae are normal.  Mild perioral and slight anterior facial edema but no bruising. Head: Atraumatic. Nose: No congestion/rhinnorhea. Mouth/Throat: Mucous membranes are moist. Neck: No stridor.  No cervical tenderness Cardiovascular: Normal rate, regular rhythm. Grossly normal heart sounds.  Good peripheral circulation does feel cool to touch. Respiratory: Normal respiratory effort.  No retractions. Lungs CTAB. Gastrointestinal: Soft and nontender. No distention. Musculoskeletal: No lower extremity tenderness nor edema.  Bruising noted across the right upper thigh and into the right lower pelvic region.  No hematoma to noted. Neurologic:  Normal speech and language. No gross focal neurologic deficits are appreciated.  Skin:  Skin is warm, dry and intact. 'bruising left upper arm, left chest, right shoulder, upper mid back. significant bruising to right hip and buttocks' Psychiatric: Mood and affect are normal. Speech and behavior are normal.  Van LVO scale is negative.  No motor deficit.  No visual field deficits aphasia or neglect ____________________________________________   LABS (all labs ordered are listed, but only abnormal results are displayed)  Labs Reviewed  LACTIC ACID, PLASMA - Abnormal; Notable for the following components:      Result Value   Lactic Acid, Venous 3.6 (*)    All other components within normal limits  COMPREHENSIVE METABOLIC PANEL - Abnormal; Notable for the following components:   Sodium 114 (*)     Potassium 3.3 (*)    Chloride 82 (*)    CO2 20 (*)    Calcium 8.4 (*)    Total Protein 6.1 (*)    Albumin 3.4 (*)    AST 117 (*)    Total Bilirubin 3.3 (*)    GFR, Estimated 59 (*)    All other components within normal limits  CBC WITH DIFFERENTIAL/PLATELET - Abnormal; Notable for the following components:   WBC 16.8 (*)    RBC 2.20 (*)    Hemoglobin 8.2 (*)    HCT 22.2 (*)  MCV 100.9 (*)    MCH 37.3 (*)    MCHC 36.9 (*)    Neutro Abs 14.4 (*)    Monocytes Absolute 1.2 (*)    Abs Immature Granulocytes 0.17 (*)    All other components within normal limits  PROTIME-INR - Abnormal; Notable for the following components:   Prothrombin Time 17.3 (*)    INR 1.4 (*)    All other components within normal limits  URINALYSIS, COMPLETE (UACMP) WITH MICROSCOPIC - Abnormal; Notable for the following components:   Color, Urine AMBER (*)    APPearance CLEAR (*)    Hgb urine dipstick SMALL (*)    Protein, ur 100 (*)    Bacteria, UA RARE (*)    All other components within normal limits  CK - Abnormal; Notable for the following components:   Total CK 1,181 (*)    All other components within normal limits  BLOOD GAS, VENOUS - Abnormal; Notable for the following components:   pCO2, Ven 42 (*)    pO2, Ven <31.0 (*)    Acid-base deficit 4.2 (*)    All other components within normal limits  T4, FREE - Abnormal; Notable for the following components:   Free T4 1.28 (*)    All other components within normal limits  CBG MONITORING, ED - Abnormal; Notable for the following components:   Glucose-Capillary 100 (*)    All other components within normal limits  TROPONIN I (HIGH SENSITIVITY) - Abnormal; Notable for the following components:   Troponin I (High Sensitivity) 42 (*)    All other components within normal limits  RESP PANEL BY RT-PCR (FLU A&B, COVID) ARPGX2  CULTURE, BLOOD (ROUTINE X 2)  CULTURE, BLOOD (ROUTINE X 2)  URINE CULTURE  CULTURE, BLOOD (ROUTINE X 2)  CULTURE, BLOOD  (ROUTINE X 2)  APTT  TSH  LACTIC ACID, PLASMA  BASIC METABOLIC PANEL  CORTISOL  PROCALCITONIN  URINALYSIS, ROUTINE W REFLEX MICROSCOPIC   ____________________________________________  EKG  Reviewed inter by me at 959 heart rate 75 Irregular, suspect underlying A. fib.  Nonspecific T wave abnormality.  No evidence of acute STEMI or obvious acute ischemia.  I do not see clearly discernible P waves ____________________________________________  RADIOLOGY  DG Forearm Right  Result Date: 13-May-2021 CLINICAL DATA:  Patient coming [facility] from home for unwitnessed fall. Patient reportedly fell once yesterday as well; EMS helped patient up, but patient refused transport to hospital for first fall. Patient reports down for second fall for 8 hours. Pain and bruising down right arm and hip. Pt unable to verbalize coherently at this point, unable to obtain history. EXAM: RIGHT FOREARM - 2 VIEW COMPARISON:  None. FINDINGS: No fracture or bone lesion. Elbow and wrist joints are normally aligned. There is diffuse subcutaneous soft tissue edema, nonspecific. IMPRESSION: No fracture or dislocation. Electronically Signed   By: Amie Portland M.D.   On: 05/13/21 10:52   CT HEAD WO CONTRAST ( )  Result Date: 05-13-2021 CLINICAL DATA:  Fall.  Periorbital swelling.  Neck pain. EXAM: CT HEAD WITHOUT CONTRAST CT MAXILLOFACIAL WITHOUT CONTRAST CT CERVICAL SPINE WITHOUT CONTRAST TECHNIQUE: Multidetector CT imaging of the head, cervical spine, and maxillofacial structures were performed using the standard protocol without intravenous contrast. Multiplanar CT image reconstructions of the cervical spine and maxillofacial structures were also generated. COMPARISON:  None. FINDINGS: CT HEAD FINDINGS Brain: No evidence of acute infarction, hemorrhage, hydrocephalus, extra-axial collection or mass lesion/mass effect. Patchy white matter hypoattenuation noted bilaterally consistent with mild chronic microvascular  ischemic  change. Vascular: No hyperdense vessel or unexpected calcification. Skull: Normal. Negative for fracture or focal lesion. Other: None. CT MAXILLOFACIAL FINDINGS Osseous: No fracture or mandibular dislocation. No destructive process. Orbits: Right preseptal periorbital soft tissue swelling/hemorrhage. No injury to the right globe or postseptal orbit. Normal left globe and orbit. Sinuses: Minimal dependent fluid in the left maxillary sinus. Remaining sinuses are clear. Clear mastoid air cells and middle ear cavities. Soft tissues: Soft tissue swelling extends from the right periorbital region across the right cheek. CT CERVICAL SPINE FINDINGS Alignment: Straightened cervical lordosis.  No spondylolisthesis. Skull base and vertebrae: No acute fracture. No primary bone lesion or focal pathologic process. Soft tissues and spinal canal: No prevertebral fluid or swelling. No visible canal hematoma. Disc levels: Mild loss of disc height at C4-C5. Moderate loss of disc height at C5-C6 and C6-C7. Mild spondylotic disc bulging with endplate spurring at these levels. No convincing disc herniation. Bilateral facet degenerative changes, greater on the left. Upper chest: No acute findings. Partly calcified apical pleuroparenchymal scarring. Other: None. IMPRESSION: HEAD CT 1. No acute intracranial abnormalities.  No skull fracture. MAXILLOFACIAL CT 1. No fractures. 2. Right preseptal periorbital soft tissue swelling/hemorrhage extending across the right cheek. No injury to the right globe or postseptal orbit. CERVICAL CT 1. No fracture or acute finding. Electronically Signed   By: Amie Portland M.D.   On: 2021/05/17 10:19   CT Cervical Spine Wo Contrast  Result Date: 17-May-2021 CLINICAL DATA:  Fall.  Periorbital swelling.  Neck pain. EXAM: CT HEAD WITHOUT CONTRAST CT MAXILLOFACIAL WITHOUT CONTRAST CT CERVICAL SPINE WITHOUT CONTRAST TECHNIQUE: Multidetector CT imaging of the head, cervical spine, and maxillofacial structures  were performed using the standard protocol without intravenous contrast. Multiplanar CT image reconstructions of the cervical spine and maxillofacial structures were also generated. COMPARISON:  None. FINDINGS: CT HEAD FINDINGS Brain: No evidence of acute infarction, hemorrhage, hydrocephalus, extra-axial collection or mass lesion/mass effect. Patchy white matter hypoattenuation noted bilaterally consistent with mild chronic microvascular ischemic change. Vascular: No hyperdense vessel or unexpected calcification. Skull: Normal. Negative for fracture or focal lesion. Other: None. CT MAXILLOFACIAL FINDINGS Osseous: No fracture or mandibular dislocation. No destructive process. Orbits: Right preseptal periorbital soft tissue swelling/hemorrhage. No injury to the right globe or postseptal orbit. Normal left globe and orbit. Sinuses: Minimal dependent fluid in the left maxillary sinus. Remaining sinuses are clear. Clear mastoid air cells and middle ear cavities. Soft tissues: Soft tissue swelling extends from the right periorbital region across the right cheek. CT CERVICAL SPINE FINDINGS Alignment: Straightened cervical lordosis.  No spondylolisthesis. Skull base and vertebrae: No acute fracture. No primary bone lesion or focal pathologic process. Soft tissues and spinal canal: No prevertebral fluid or swelling. No visible canal hematoma. Disc levels: Mild loss of disc height at C4-C5. Moderate loss of disc height at C5-C6 and C6-C7. Mild spondylotic disc bulging with endplate spurring at these levels. No convincing disc herniation. Bilateral facet degenerative changes, greater on the left. Upper chest: No acute findings. Partly calcified apical pleuroparenchymal scarring. Other: None. IMPRESSION: HEAD CT 1. No acute intracranial abnormalities.  No skull fracture. MAXILLOFACIAL CT 1. No fractures. 2. Right preseptal periorbital soft tissue swelling/hemorrhage extending across the right cheek. No injury to the right  globe or postseptal orbit. CERVICAL CT 1. No fracture or acute finding. Electronically Signed   By: Amie Portland M.D.   On: 05-17-21 10:19   DG Hand 2 View Right  Result Date: May 17, 2021 CLINICAL DATA:  Patient  coming [facility] from home for unwitnessed fall. Patient reportedly fell once yesterday as well; EMS helped patient up, but patient refused transport to hospital for first fall. Patient reports down for second fall for 8 hours. Pain and bruising down right arm and hip. Pt unable to verbalize coherently at this point, unable to obtain history. EXAM: RIGHT HAND - 2 VIEW COMPARISON:  None. FINDINGS: No fracture or bone lesion. Arthropathic changes involving multiple joints, most prominently the DIP joint of the middle finger, pattern consistent with osteoarthritis. Joints are normally aligned. Soft tissues are unremarkable. IMPRESSION: 1. No fracture or dislocation. 2. Osteoarthritis. Electronically Signed   By: Amie Portland M.D.   On: 05/18/2021 10:53   DG Chest Port 1 View  Result Date: 05/19/2021 CLINICAL DATA:  Patient coming [facility] from home for unwitnessed fall. Patient reportedly fell once yesterday as well; EMS helped patient up, but patient refused transport to hospital for first fall. Patient reports down for second fall for 8 hours. Pain and bruising down right arm and hip. Pt unable to verbalize coherently at this point, unable to obtain history. EXAM: PORTABLE CHEST 1 VIEW COMPARISON:  03/21/2017 FINDINGS: Mild enlargement of the cardiopericardial silhouette. No mediastinal or hilar masses. Clear lungs.  No convincing pleural effusion or pneumothorax. Skeletal structures are grossly intact. IMPRESSION: No active disease. Electronically Signed   By: Amie Portland M.D.   On: 05/04/2021 10:50   DG Humerus Right  Result Date: 05/02/2021 CLINICAL DATA:  Patient coming [facility] from home for unwitnessed fall. Patient reportedly fell once yesterday as well; EMS helped patient up, but patient  refused transport to hospital for first fall. Patient reports down for second fall for 8 hours. Pain and bruising down right arm and hip. Pt unable to verbalize coherently at this point, unable to obtain history. EXAM: RIGHT HUMERUS - 2+ VIEW COMPARISON:  None. FINDINGS: No fracture or bone lesion. Glenohumeral and elbow joints are normally aligned. Soft tissues are unremarkable. IMPRESSION: No fracture or dislocation. Electronically Signed   By: Amie Portland M.D.   On: 05/01/2021 10:51   DG HIP UNILAT WITH PELVIS 2-3 VIEWS RIGHT  Result Date: 05/17/2021 CLINICAL DATA:  Patient coming [facility] from home for unwitnessed fall. Patient reportedly fell once yesterday as well; EMS helped patient up, but patient refused transport to hospital for first fall. Patient reports down for second fall for 8 hours. Pain and bruising down right arm and hip. Pt unable to verbalize coherently at this point, unable to obtain history. EXAM: DG HIP (WITH OR WITHOUT PELVIS) 2-3V RIGHT COMPARISON:  None. FINDINGS: No fracture or bone lesion. Hip joints, SI joints and symphysis pubis are normally aligned. Soft tissues are unremarkable. IMPRESSION: 1. No fracture or dislocation. Electronically Signed   By: Amie Portland M.D.   On: 04/28/2021 10:50   CT Maxillofacial Wo Contrast  Result Date: 05/13/2021 CLINICAL DATA:  Fall.  Periorbital swelling.  Neck pain. EXAM: CT HEAD WITHOUT CONTRAST CT MAXILLOFACIAL WITHOUT CONTRAST CT CERVICAL SPINE WITHOUT CONTRAST TECHNIQUE: Multidetector CT imaging of the head, cervical spine, and maxillofacial structures were performed using the standard protocol without intravenous contrast. Multiplanar CT image reconstructions of the cervical spine and maxillofacial structures were also generated. COMPARISON:  None. FINDINGS: CT HEAD FINDINGS Brain: No evidence of acute infarction, hemorrhage, hydrocephalus, extra-axial collection or mass lesion/mass effect. Patchy white matter hypoattenuation noted  bilaterally consistent with mild chronic microvascular ischemic change. Vascular: No hyperdense vessel or unexpected calcification. Skull: Normal. Negative for fracture or  focal lesion. Other: None. CT MAXILLOFACIAL FINDINGS Osseous: No fracture or mandibular dislocation. No destructive process. Orbits: Right preseptal periorbital soft tissue swelling/hemorrhage. No injury to the right globe or postseptal orbit. Normal left globe and orbit. Sinuses: Minimal dependent fluid in the left maxillary sinus. Remaining sinuses are clear. Clear mastoid air cells and middle ear cavities. Soft tissues: Soft tissue swelling extends from the right periorbital region across the right cheek. CT CERVICAL SPINE FINDINGS Alignment: Straightened cervical lordosis.  No spondylolisthesis. Skull base and vertebrae: No acute fracture. No primary bone lesion or focal pathologic process. Soft tissues and spinal canal: No prevertebral fluid or swelling. No visible canal hematoma. Disc levels: Mild loss of disc height at C4-C5. Moderate loss of disc height at C5-C6 and C6-C7. Mild spondylotic disc bulging with endplate spurring at these levels. No convincing disc herniation. Bilateral facet degenerative changes, greater on the left. Upper chest: No acute findings. Partly calcified apical pleuroparenchymal scarring. Other: None. IMPRESSION: HEAD CT 1. No acute intracranial abnormalities.  No skull fracture. MAXILLOFACIAL CT 1. No fractures. 2. Right preseptal periorbital soft tissue swelling/hemorrhage extending across the right cheek. No injury to the right globe or postseptal orbit. CERVICAL CT 1. No fracture or acute finding. Electronically Signed   By: Amie Portland M.D.   On: 05/01/2021 10:19     Imaging reviewed, CT no acute intracranial hemorrhage.  Right preseptal periorbital soft tissue swelling ____________________________________________   PROCEDURES  Procedure(s) performed: None  Procedures  Critical Care performed:  Yes, see critical care note(s)  CRITICAL CARE Performed by: Sharyn Creamer   Total critical care time: 35 minutes  Critical care time was exclusive of separately billable procedures and treating other patients.  Critical care was necessary to treat or prevent imminent or life-threatening deterioration.  Critical care was time spent personally by me on the following activities: development of treatment plan with patient and/or surrogate as well as nursing, discussions with consultants, evaluation of patient's response to treatment, examination of patient, obtaining history from patient or surrogate, ordering and performing treatments and interventions, ordering and review of laboratory studies, ordering and review of radiographic studies, pulse oximetry and re-evaluation of patient's condition.  Notable hypothermia in the setting of patient found down.  Required immediate evaluation by ER physician for evaluation and stabilization, will initiate active warming with bear hugger, temp Foley, broad work-up. ____________________________________________   INITIAL IMPRESSION / ASSESSMENT AND PLAN / ED COURSE  Pertinent labs & imaging results that were available during my care of the patient were reviewed by me and considered in my medical decision making (see chart for details).   Fall.  Unclear precipitating factor.  Reports 2 to 3 weeks of feeling ill  Needs broad work-up.  By clinical assessment and evaluation and hypothermia concern for infection, environmental, metabolic, etc.  Will initiate broad infectious work-up also metabolic work-up.    Clinical Course as of 05/11/2021 1151  Sun May 10, 2021  1008 Lactic Acid, Venous(!!): 3.6 Noted. < 4 [MQ]  1008 pH, Ven: 7.32 normal [MQ]  1008 BP(!): 147/96 No hypotension [MQ]  1008 Temp(!): 93.6 F (34.2 C) Bair hugger applied [MQ]  1058 Sodium 114.  Has received approximately 500 mL of normal saline in total.  We will hold further at this  point, and have placed consult to nephrology.  Prior admission for hyponatremia seem to have been driven by diuretic, I do not see that she is currently taking diuretic now but she does appear slightly hypovolemic with dry  mucous membranes and a history of being down on the floor for several hours.  CK is elevated indicative of possible developing rhabdo as well.  Consult to nephrology for recommendations has been placed with Dr. Cherylann Ratel as need for careful attention to electroltytes and sodium in the setting of possibly developing rhabdo.  [MQ]  1115 Case discussed with hospitalist Dr. Clyde Lundborg, who recommends that the ICU team be called for admission given the level of hyponatremia.  I have paged ICU MD to request admission.  Awaiting callback from nephrology Dr. Letitia Libra at this time consult has been placed to nephrology and haiku message sent [MQ]  1125 Dr. Cherylann Ratel advises repeat BMP now, and is remains around 114, start 30 cc/hr of 3% NACL.  [MQ]    Clinical Course User Index [MQ] Sharyn Creamer, MD   ----------------------------------------- 11:04 AM on May 24, 2021 ----------------------------------------- Admission accepted by Dr. Karna Christmas (ICU MD).  Patient admitted to ICU service, currently awaiting repeat BMP to further tailor treatment and consideration of initiating 3% normal saline.  Patient will be consulted on by nephrology, admitted to the ICU.  ____________________________________________   FINAL CLINICAL IMPRESSION(S) / ED DIAGNOSES  Final diagnoses:  Hyponatremia  Traumatic rhabdomyolysis, initial encounter (HCC)  Leukocytosis, unspecified type        Note:  This document was prepared using Dragon voice recognition software and may include unintentional dictation errors       Sharyn Creamer, MD 06/16/2021 1151

## 2021-05-10 NOTE — ED Notes (Signed)
Admitting provider aware of pt's temp at this time and ordered discontinuation of Bair hugger.

## 2021-05-10 NOTE — Consult Note (Signed)
MEDICATION RELATED CONSULT NOTE  Pharmacy Consult for Hypertonic Saline Monitoring Indication: Severe hyponatremia  Patient Measurements: Height: 5\' 2"  (157.5 cm) Weight: 86.9 kg (191 lb 9.3 oz) IBW/kg (Calculated) : 50.1  Vital Signs: Temp: 93.7 F (34.3 C) (12/18 1100) BP: 103/70 (12/18 1100) Pulse Rate: 68 (12/18 1100) Intake/Output from previous day: No intake/output data recorded. Intake/Output from this shift: Total I/O In: 500 [IV Piggyback:500] Out: -   Labs: Recent Labs    2021/05/16 0928 2021-05-16 1126  WBC 16.8*  --   HGB 8.2*  --   HCT 22.2*  --   PLT 152  --   APTT 31  --   CREATININE 0.97 1.05*  ALBUMIN 3.4*  --   PROT 6.1*  --   AST 117*  --   ALT 38  --   ALKPHOS 113  --   BILITOT 3.3*  --    Estimated Creatinine Clearance: 43 mL/min (A) (by C-G formula based on SCr of 1.05 mg/dL (H)).  Medical History: Past Medical History:  Diagnosis Date   Anxiety    Gout    High cholesterol    Hypertension     Medications:  Hypertonic saline at 30 cc/hr  Assessment: Patient is an 79 y/o F with medical history as above who presented to the ED 12/18 with falls / prolonged down time. Patient found to be severely hyponatremic. Baseline sodium 114.   Goal of Therapy:  Will contact provider if sodium increases > 4 mmol/L in 2 hours or > 6 mmol in 4 hours Increase sodium 8 - 10 mmol in first 24h (goal hourly rate roughly 0.5 mmol/L/hr)  Plan:  --Hypertonic saline to be started at 30 cc/hr --Na q2h x 2 then q4h --Pharmacy will continue to follow along for safe correction  1/19 05/16/2021,12:13 PM

## 2021-05-10 NOTE — ED Notes (Signed)
Phlebotomist at bedside attempting to collect specimen.

## 2021-05-10 NOTE — ED Notes (Signed)
Phlebotomist reports she is unable to obtain blood culture at this time.

## 2021-05-11 LAB — URINE CULTURE: Culture: NO GROWTH

## 2021-05-11 LAB — BASIC METABOLIC PANEL
Anion gap: 11 (ref 5–15)
BUN: 28 mg/dL — ABNORMAL HIGH (ref 8–23)
CO2: 17 mmol/L — ABNORMAL LOW (ref 22–32)
Calcium: 8.2 mg/dL — ABNORMAL LOW (ref 8.9–10.3)
Chloride: 90 mmol/L — ABNORMAL LOW (ref 98–111)
Creatinine, Ser: 1.35 mg/dL — ABNORMAL HIGH (ref 0.44–1.00)
GFR, Estimated: 39 mL/min — ABNORMAL LOW (ref >60)
Glucose, Bld: 82 mg/dL (ref 70–99)
Potassium: 3.6 mmol/L (ref 3.5–5.1)
Sodium: 118 mmol/L — CL (ref 135–145)

## 2021-05-11 LAB — CBC
HCT: 19.2 % — ABNORMAL LOW (ref 36.0–46.0)
Hemoglobin: 7.1 g/dL — ABNORMAL LOW (ref 12.0–15.0)
MCH: 37.6 pg — ABNORMAL HIGH (ref 26.0–34.0)
MCHC: 37 g/dL — ABNORMAL HIGH (ref 30.0–36.0)
MCV: 101.6 fL — ABNORMAL HIGH (ref 80.0–100.0)
Platelets: 143 10*3/uL — ABNORMAL LOW (ref 150–400)
RBC: 1.89 MIL/uL — ABNORMAL LOW (ref 3.87–5.11)
RDW: 15.1 % (ref 11.5–15.5)
WBC: 17 10*3/uL — ABNORMAL HIGH (ref 4.0–10.5)
nRBC: 0 % (ref 0.0–0.2)

## 2021-05-11 LAB — SODIUM
Sodium: 121 mmol/L — ABNORMAL LOW (ref 135–145)
Sodium: 121 mmol/L — ABNORMAL LOW (ref 135–145)
Sodium: 120 mmol/L — ABNORMAL LOW (ref 135–145)
Sodium: 123 mmol/L — ABNORMAL LOW (ref 135–145)
Sodium: 122 mmol/L — ABNORMAL LOW (ref 135–145)

## 2021-05-11 LAB — MAGNESIUM: Magnesium: 1.5 mg/dL — ABNORMAL LOW (ref 1.7–2.4)

## 2021-05-11 LAB — PHOSPHORUS: Phosphorus: 3.4 mg/dL (ref 2.5–4.6)

## 2021-05-11 MED ORDER — ENSURE ENLIVE PO LIQD
237.0000 mL | Freq: Three times a day (TID) | ORAL | Status: DC
  Administered 2021-05-11 – 2021-05-19 (×19): 237 mL via ORAL

## 2021-05-11 MED ORDER — ADULT MULTIVITAMIN W/MINERALS CH
1.0000 | ORAL_TABLET | Freq: Every day | ORAL | Status: DC
  Administered 2021-05-13 – 2021-05-20 (×7): 1 via ORAL
  Filled 2021-05-11 (×7): qty 1

## 2021-05-11 MED ORDER — MAGNESIUM SULFATE 2 GM/50ML IV SOLN
2.0000 g | Freq: Once | INTRAVENOUS | Status: AC
  Administered 2021-05-11: 19:00:00 2 g via INTRAVENOUS
  Filled 2021-05-11: qty 50

## 2021-05-11 NOTE — Progress Notes (Signed)
CRITICAL CARE PROGRESS NOTE    Name: Angela Sullivan MRN: [id number] DOB: Oct 25, 1940     LOS: 1   SUBJECTIVE FINDINGS & SIGNIFICANT EVENTS    Patient description:   80 yo with PMH as below came in after being found down in cold interior.  Found to have hypothermia 11F and hyponatremia 114 with confusion. In ER nephrology was consulted and recommendation for 3NS at 30cc/hr. On interview patient seems uncomfortable but clinically improved.    05/11/21- Patient remains with encephalopahty. Na is slowly imrpoving, mentation is improved. Transferred to [facility] hospitalist serivce   Lines/tubes : Urethral Catheter Eliezer Lofts, RN Temperature probe (Active)    Microbiology/Sepsis markers: Results for orders placed or performed during the hospital encounter of 04/29/2021  Resp Panel by RT-PCR (Flu A&B, Covid)     Status: None   Collection Time: 05/21/2021  9:33 AM   Specimen: Nasopharyngeal(NP) swabs in vial transport medium  Result Value Ref Range Status   SARS Coronavirus 2 by RT PCR NEGATIVE NEGATIVE Final    Comment: (NOTE) SARS-CoV-2 target nucleic acids are NOT DETECTED.  The SARS-CoV-2 RNA is generally detectable in upper respiratory specimens during the acute phase of infection. The lowest concentration of SARS-CoV-2 viral copies this assay can detect is 138 copies/mL. A negative result does not preclude SARS-Cov-2 infection and should not be used as the sole basis for treatment or other patient management decisions. A negative result may occur with  improper specimen collection/handling, submission of specimen other than nasopharyngeal swab, presence of viral mutation(s) within the areas targeted by this assay, and inadequate number of viral copies(<138 copies/mL). A negative result must be combined with clinical  observations, patient history, and epidemiological information. The expected result is Negative.  Fact Sheet for Patients:  EntrepreneurPulse.com.au  Fact Sheet for Healthcare Providers:  IncredibleEmployment.be  This test is no t yet approved or cleared by the [place] FDA and  has been authorized for detection and/or diagnosis of SARS-CoV-2 by FDA under an Emergency Use Authorization (EUA). This EUA will remain  in effect (meaning this test can be used) for the duration of the COVID-19 declaration under Section 564(b)(1) of the Act, 21 U.S.C.section 360bbb-3(b)(1), unless the authorization is terminated  or revoked sooner.       Influenza A by PCR NEGATIVE NEGATIVE Final   Influenza B by PCR NEGATIVE NEGATIVE Final    Comment: (NOTE) The Xpert Xpress SARS-CoV-2/FLU/RSV plus assay is intended as an aid in the diagnosis of influenza from Nasopharyngeal swab specimens and should not be used as a sole basis for treatment. Nasal washings and aspirates are unacceptable for Xpert Xpress SARS-CoV-2/FLU/RSV testing.  Fact Sheet for Patients: EntrepreneurPulse.com.au  Fact Sheet for Healthcare Providers: IncredibleEmployment.be  This test is not yet approved or cleared by the [place] FDA and has been authorized for detection and/or diagnosis of SARS-CoV-2 by FDA under an Emergency Use Authorization (EUA). This EUA will remain in effect (meaning this test can be used) for the duration of the COVID-19 declaration under Section 564(b)(1) of the Act, 21 U.S.C. section 360bbb-3(b)(1), unless the authorization is terminated or revoked.  Performed at [facility], [address] [place], [place] [address]   Blood Culture (routine x 2)     Status: None (Preliminary result)   Collection Time: 05/01/2021  9:34 AM   Specimen: BLOOD  Result Value Ref Range Status   Specimen Description BLOOD RIGHT  ANTECUBITAL  Final   Special Requests   Final  BOTTLES DRAWN AEROBIC AND ANAEROBIC Blood Culture results may not be optimal due to an excessive volume of blood received in culture bottles   Culture   Final    NO GROWTH < 24 HOURS Performed at [facility], [address] [place], [place] [address]    Report Status PENDING  Incomplete  MRSA Next Gen by PCR, Nasal     Status: None   Collection Time: 05/15/2021  6:16 PM   Specimen: Nasal Mucosa; Nasal Swab  Result Value Ref Range Status   MRSA by PCR Next Gen NOT DETECTED NOT DETECTED Final    Comment: (NOTE) The GeneXpert MRSA Assay (FDA approved for NASAL specimens only), is one component of a comprehensive MRSA colonization surveillance program. It is not intended to diagnose MRSA infection nor to guide or monitor treatment for MRSA infections. Test performance is not FDA approved in patients less than 47 years old. Performed at [facility], [address] [place], [place] [address]   Blood Culture (routine x 2)     Status: None (Preliminary result)   Collection Time: 05/02/2021  6:50 PM   Specimen: BLOOD  Result Value Ref Range Status   Specimen Description BLOOD BLOOD RIGHT HAND  Final   Special Requests   Final    BOTTLES DRAWN AEROBIC ONLY Blood Culture results may not be optimal due to an inadequate volume of blood received in culture bottles   Culture   Final    NO GROWTH < 12 HOURS Performed at [facility], [address] [place], [place] [address]    Report Status PENDING  Incomplete    Anti-infectives:  Anti-infectives (From admission, onward)    None       PAST MEDICAL HISTORY   Past Medical History:  Diagnosis Date   Anxiety    Gout    High cholesterol    Hypertension      SURGICAL HISTORY   Past Surgical History:  Procedure Laterality Date   DILATION AND CURETTAGE OF UTERUS       FAMILY HISTORY   Family History  Problem Relation Age of Onset   Heart  disease Father    COPD Neg Hx    Diabetes Mellitus II Neg Hx      SOCIAL HISTORY   Social History   Tobacco Use   Smoking status: Never   Smokeless tobacco: Never  Vaping Use   Vaping Use: Never used  Substance Use Topics   Alcohol use: Yes    Comment: occasional   Drug use: No     MEDICATIONS   Current Medication:  Current Facility-Administered Medications:    aspirin EC tablet 325 mg, 325 mg, Oral, Daily, Irem Stoneham, MD   Chlorhexidine Gluconate Cloth 2 % PADS 6 each, 6 each, Topical, Q0600, Maria Gallicchio, MD   polyethylene glycol (MIRALAX / GLYCOLAX) packet 17 g, 17 g, Oral, Daily PRN, Lanney Gins, Berdell Nevitt, MD   sodium chloride (hypertonic) 3 % solution, , Intravenous, Continuous, Quale, Mark, MD, Last Rate: 30 mL/hr at 05/11/21 0700, Infusion Verify at 05/11/21 0700    ALLERGIES   Patient has no known allergies.    REVIEW OF SYSTEMS     10 point ros conducted and is negative except as per subjective findings.   PHYSICAL EXAMINATION   Vital Signs: Temp:  [93.7 F (34.3 C)-99 F (37.2 C)] 99 F (37.2 C) (12/19 0900) Pulse Rate:  [43-96] 86 (12/19 0900) Resp:  [15-26] 15 (12/19 0900) BP: (76-120)/(37-93) 94/45 (  12/19 0900) SpO2:  [93 %-100 %] 98 % (12/19 0900) Weight:  [84.9 kg-86.6 kg] 86.6 kg (12/19 0500)  GENERAL:Mild distress Age appropriate HEAD: Normocephalic, atraumatic.  EYES: Pupils equal, round, reactive to light.  No scleral icterus.  MOUTH: Moist mucosal membrane. NECK: Supple. No thyromegaly. No nodules. No JVD.  PULMONARY: rhonchi mild bilaterally CARDIOVASCULAR: S1 and S2. Regular rate and rhythm. No murmurs, rubs, or gallops.  GASTROINTESTINAL: Soft, nontender, non-distended. No masses. Positive bowel sounds. No hepatosplenomegaly.  MUSCULOSKELETAL: No swelling, clubbing, or edema.  NEUROLOGIC: Mild distress due to acute illness SKIN:intact,warm,dry   PERTINENT DATA     Infusions:  Scheduled Medications:  PRN  Medications:  Hemodynamic parameters:   Intake/Output: 12/18 0701 - 12/19 0700 In: 2050 [I.V.:550; IV Piggyback:1500] Out: 550 [Urine:550]  Ventilator  Settings:      LAB RESULTS:  Basic Metabolic Panel: Recent Labs  Lab 04/29/2021 0928 04/23/2021 1126 05/02/2021 1225 05/13/2021 1550 05/05/2021 1850 04/26/2021 2130 05/11/21 0331 05/11/21 0656  NA 114* 114*   < > 115* 116* 116* 118* 121*  K 3.3* 3.7  --   --   --   --  3.6  --   CL 82* 83*  --   --   --   --  90*  --   CO2 20* 21*  --   --   --   --  17*  --   GLUCOSE 96 101*  --   --   --   --  82  --   BUN 23 23  --   --   --   --  28*  --   CREATININE 0.97 1.05*  --   --   --   --  1.35*  --   CALCIUM 8.4* 8.5*  --   --   --   --  8.2*  --   MG  --   --   --   --   --   --  1.5*  --   PHOS  --   --   --   --   --   --  3.4  --    < > = values in this interval not displayed.    Liver Function Tests: Recent Labs  Lab 05/14/2021 0928  AST 117*  ALT 38  ALKPHOS 113  BILITOT 3.3*  PROT 6.1*  ALBUMIN 3.4*    No results for input(s): LIPASE, AMYLASE in the last 168 hours. No results for input(s): AMMONIA in the last 168 hours. CBC: Recent Labs  Lab 04/23/2021 0928 05/11/21 0331  WBC 16.8* 17.0*  NEUTROABS 14.4*  --   HGB 8.2* 7.1*  HCT 22.2* 19.2*  MCV 100.9* 101.6*  PLT 152 143*    Cardiac Enzymes: Recent Labs  Lab 04/30/2021 0928  CKTOTAL 1,181*    BNP: Invalid input(s): POCBNP CBG: Recent Labs  Lab 05/19/2021 0923 04/28/2021 1811 05/16/2021 1951 05/04/2021 2328  GLUCAP 100* 80 82 85        IMAGING RESULTS:  Imaging: DG Forearm Right  Result Date: 05/05/2021 CLINICAL DATA:  Patient coming [facility] from home for unwitnessed fall. Patient reportedly fell once yesterday as well; EMS helped patient up, but patient refused transport to hospital for first fall. Patient reports down for second fall for 8 hours. Pain and bruising down right arm and hip. Pt unable to verbalize coherently at this point, unable to  obtain history. EXAM: RIGHT FOREARM - 2 VIEW COMPARISON:  None. FINDINGS: No  fracture or bone lesion. Elbow and wrist joints are normally aligned. There is diffuse subcutaneous soft tissue edema, nonspecific. IMPRESSION: No fracture or dislocation. Electronically Signed   By: Lajean Manes M.D.   On: 05/04/2021 10:52   CT HEAD WO CONTRAST (5MM)  Result Date: 05/21/2021 CLINICAL DATA:  Fall.  Periorbital swelling.  Neck pain. EXAM: CT HEAD WITHOUT CONTRAST CT MAXILLOFACIAL WITHOUT CONTRAST CT CERVICAL SPINE WITHOUT CONTRAST TECHNIQUE: Multidetector CT imaging of the head, cervical spine, and maxillofacial structures were performed using the standard protocol without intravenous contrast. Multiplanar CT image reconstructions of the cervical spine and maxillofacial structures were also generated. COMPARISON:  None. FINDINGS: CT HEAD FINDINGS Brain: No evidence of acute infarction, hemorrhage, hydrocephalus, extra-axial collection or mass lesion/mass effect. Patchy white matter hypoattenuation noted bilaterally consistent with mild chronic microvascular ischemic change. Vascular: No hyperdense vessel or unexpected calcification. Skull: Normal. Negative for fracture or focal lesion. Other: None. CT MAXILLOFACIAL FINDINGS Osseous: No fracture or mandibular dislocation. No destructive process. Orbits: Right preseptal periorbital soft tissue swelling/hemorrhage. No injury to the right globe or postseptal orbit. Normal left globe and orbit. Sinuses: Minimal dependent fluid in the left maxillary sinus. Remaining sinuses are clear. Clear mastoid air cells and middle ear cavities. Soft tissues: Soft tissue swelling extends from the right periorbital region across the right cheek. CT CERVICAL SPINE FINDINGS Alignment: Straightened cervical lordosis.  No spondylolisthesis. Skull base and vertebrae: No acute fracture. No primary bone lesion or focal pathologic process. Soft tissues and spinal canal: No prevertebral fluid or  swelling. No visible canal hematoma. Disc levels: Mild loss of disc height at C4-C5. Moderate loss of disc height at C5-C6 and C6-C7. Mild spondylotic disc bulging with endplate spurring at these levels. No convincing disc herniation. Bilateral facet degenerative changes, greater on the left. Upper chest: No acute findings. Partly calcified apical pleuroparenchymal scarring. Other: None. IMPRESSION: HEAD CT 1. No acute intracranial abnormalities.  No skull fracture. MAXILLOFACIAL CT 1. No fractures. 2. Right preseptal periorbital soft tissue swelling/hemorrhage extending across the right cheek. No injury to the right globe or postseptal orbit. CERVICAL CT 1. No fracture or acute finding. Electronically Signed   By: Lajean Manes M.D.   On: 05/09/2021 10:19   CT Cervical Spine Wo Contrast  Result Date: 05/19/2021 CLINICAL DATA:  Fall.  Periorbital swelling.  Neck pain. EXAM: CT HEAD WITHOUT CONTRAST CT MAXILLOFACIAL WITHOUT CONTRAST CT CERVICAL SPINE WITHOUT CONTRAST TECHNIQUE: Multidetector CT imaging of the head, cervical spine, and maxillofacial structures were performed using the standard protocol without intravenous contrast. Multiplanar CT image reconstructions of the cervical spine and maxillofacial structures were also generated. COMPARISON:  None. FINDINGS: CT HEAD FINDINGS Brain: No evidence of acute infarction, hemorrhage, hydrocephalus, extra-axial collection or mass lesion/mass effect. Patchy white matter hypoattenuation noted bilaterally consistent with mild chronic microvascular ischemic change. Vascular: No hyperdense vessel or unexpected calcification. Skull: Normal. Negative for fracture or focal lesion. Other: None. CT MAXILLOFACIAL FINDINGS Osseous: No fracture or mandibular dislocation. No destructive process. Orbits: Right preseptal periorbital soft tissue swelling/hemorrhage. No injury to the right globe or postseptal orbit. Normal left globe and orbit. Sinuses: Minimal dependent fluid in  the left maxillary sinus. Remaining sinuses are clear. Clear mastoid air cells and middle ear cavities. Soft tissues: Soft tissue swelling extends from the right periorbital region across the right cheek. CT CERVICAL SPINE FINDINGS Alignment: Straightened cervical lordosis.  No spondylolisthesis. Skull base and vertebrae: No acute fracture. No primary bone lesion or focal pathologic process. Soft tissues  and spinal canal: No prevertebral fluid or swelling. No visible canal hematoma. Disc levels: Mild loss of disc height at C4-C5. Moderate loss of disc height at C5-C6 and C6-C7. Mild spondylotic disc bulging with endplate spurring at these levels. No convincing disc herniation. Bilateral facet degenerative changes, greater on the left. Upper chest: No acute findings. Partly calcified apical pleuroparenchymal scarring. Other: None. IMPRESSION: HEAD CT 1. No acute intracranial abnormalities.  No skull fracture. MAXILLOFACIAL CT 1. No fractures. 2. Right preseptal periorbital soft tissue swelling/hemorrhage extending across the right cheek. No injury to the right globe or postseptal orbit. CERVICAL CT 1. No fracture or acute finding. Electronically Signed   By: Amie Portland M.D.   On: 05/07/2021 10:19   DG Hand 2 View Right  Result Date: 04/29/2021 CLINICAL DATA:  Patient coming [facility] from home for unwitnessed fall. Patient reportedly fell once yesterday as well; EMS helped patient up, but patient refused transport to hospital for first fall. Patient reports down for second fall for 8 hours. Pain and bruising down right arm and hip. Pt unable to verbalize coherently at this point, unable to obtain history. EXAM: RIGHT HAND - 2 VIEW COMPARISON:  None. FINDINGS: No fracture or bone lesion. Arthropathic changes involving multiple joints, most prominently the DIP joint of the middle finger, pattern consistent with osteoarthritis. Joints are normally aligned. Soft tissues are unremarkable. IMPRESSION: 1. No fracture or  dislocation. 2. Osteoarthritis. Electronically Signed   By: Amie Portland M.D.   On: 05/18/2021 10:53   DG Chest Port 1 View  Result Date: 05/13/2021 CLINICAL DATA:  Patient coming [facility] from home for unwitnessed fall. Patient reportedly fell once yesterday as well; EMS helped patient up, but patient refused transport to hospital for first fall. Patient reports down for second fall for 8 hours. Pain and bruising down right arm and hip. Pt unable to verbalize coherently at this point, unable to obtain history. EXAM: PORTABLE CHEST 1 VIEW COMPARISON:  03/21/2017 FINDINGS: Mild enlargement of the cardiopericardial silhouette. No mediastinal or hilar masses. Clear lungs.  No convincing pleural effusion or pneumothorax. Skeletal structures are grossly intact. IMPRESSION: No active disease. Electronically Signed   By: Amie Portland M.D.   On: 04/24/2021 10:50   DG Humerus Right  Result Date: 05/18/2021 CLINICAL DATA:  Patient coming [facility] from home for unwitnessed fall. Patient reportedly fell once yesterday as well; EMS helped patient up, but patient refused transport to hospital for first fall. Patient reports down for second fall for 8 hours. Pain and bruising down right arm and hip. Pt unable to verbalize coherently at this point, unable to obtain history. EXAM: RIGHT HUMERUS - 2+ VIEW COMPARISON:  None. FINDINGS: No fracture or bone lesion. Glenohumeral and elbow joints are normally aligned. Soft tissues are unremarkable. IMPRESSION: No fracture or dislocation. Electronically Signed   By: Amie Portland M.D.   On: 05/19/2021 10:51   DG HIP UNILAT WITH PELVIS 2-3 VIEWS RIGHT  Result Date: 04/29/2021 CLINICAL DATA:  Patient coming [facility] from home for unwitnessed fall. Patient reportedly fell once yesterday as well; EMS helped patient up, but patient refused transport to hospital for first fall. Patient reports down for second fall for 8 hours. Pain and bruising down right arm and hip. Pt unable to  verbalize coherently at this point, unable to obtain history. EXAM: DG HIP (WITH OR WITHOUT PELVIS) 2-3V RIGHT COMPARISON:  None. FINDINGS: No fracture or bone lesion. Hip joints, SI joints and symphysis pubis are normally aligned. Soft  tissues are unremarkable. IMPRESSION: 1. No fracture or dislocation. Electronically Signed   By: Lajean Manes M.D.   On: 05/14/2021 10:50   CT Maxillofacial Wo Contrast  Result Date: 05/16/2021 CLINICAL DATA:  Fall.  Periorbital swelling.  Neck pain. EXAM: CT HEAD WITHOUT CONTRAST CT MAXILLOFACIAL WITHOUT CONTRAST CT CERVICAL SPINE WITHOUT CONTRAST TECHNIQUE: Multidetector CT imaging of the head, cervical spine, and maxillofacial structures were performed using the standard protocol without intravenous contrast. Multiplanar CT image reconstructions of the cervical spine and maxillofacial structures were also generated. COMPARISON:  None. FINDINGS: CT HEAD FINDINGS Brain: No evidence of acute infarction, hemorrhage, hydrocephalus, extra-axial collection or mass lesion/mass effect. Patchy white matter hypoattenuation noted bilaterally consistent with mild chronic microvascular ischemic change. Vascular: No hyperdense vessel or unexpected calcification. Skull: Normal. Negative for fracture or focal lesion. Other: None. CT MAXILLOFACIAL FINDINGS Osseous: No fracture or mandibular dislocation. No destructive process. Orbits: Right preseptal periorbital soft tissue swelling/hemorrhage. No injury to the right globe or postseptal orbit. Normal left globe and orbit. Sinuses: Minimal dependent fluid in the left maxillary sinus. Remaining sinuses are clear. Clear mastoid air cells and middle ear cavities. Soft tissues: Soft tissue swelling extends from the right periorbital region across the right cheek. CT CERVICAL SPINE FINDINGS Alignment: Straightened cervical lordosis.  No spondylolisthesis. Skull base and vertebrae: No acute fracture. No primary bone lesion or focal pathologic  process. Soft tissues and spinal canal: No prevertebral fluid or swelling. No visible canal hematoma. Disc levels: Mild loss of disc height at C4-C5. Moderate loss of disc height at C5-C6 and C6-C7. Mild spondylotic disc bulging with endplate spurring at these levels. No convincing disc herniation. Bilateral facet degenerative changes, greater on the left. Upper chest: No acute findings. Partly calcified apical pleuroparenchymal scarring. Other: None. IMPRESSION: HEAD CT 1. No acute intracranial abnormalities.  No skull fracture. MAXILLOFACIAL CT 1. No fractures. 2. Right preseptal periorbital soft tissue swelling/hemorrhage extending across the right cheek. No injury to the right globe or postseptal orbit. CERVICAL CT 1. No fracture or acute finding. Electronically Signed   By: Lajean Manes M.D.   On: 04/29/2021 10:19   @PROBHOSP @ DG Forearm Right  Result Date: 05/12/2021 CLINICAL DATA:  Patient coming [facility] from home for unwitnessed fall. Patient reportedly fell once yesterday as well; EMS helped patient up, but patient refused transport to hospital for first fall. Patient reports down for second fall for 8 hours. Pain and bruising down right arm and hip. Pt unable to verbalize coherently at this point, unable to obtain history. EXAM: RIGHT FOREARM - 2 VIEW COMPARISON:  None. FINDINGS: No fracture or bone lesion. Elbow and wrist joints are normally aligned. There is diffuse subcutaneous soft tissue edema, nonspecific. IMPRESSION: No fracture or dislocation. Electronically Signed   By: Lajean Manes M.D.   On: 05/16/2021 10:52   DG Hand 2 View Right  Result Date: 05/21/2021 CLINICAL DATA:  Patient coming [facility] from home for unwitnessed fall. Patient reportedly fell once yesterday as well; EMS helped patient up, but patient refused transport to hospital for first fall. Patient reports down for second fall for 8 hours. Pain and bruising down right arm and hip. Pt unable to verbalize coherently at this  point, unable to obtain history. EXAM: RIGHT HAND - 2 VIEW COMPARISON:  None. FINDINGS: No fracture or bone lesion. Arthropathic changes involving multiple joints, most prominently the DIP joint of the middle finger, pattern consistent with osteoarthritis. Joints are normally aligned. Soft tissues are unremarkable. IMPRESSION: 1. No  fracture or dislocation. 2. Osteoarthritis. Electronically Signed   By: Lajean Manes M.D.   On: 04/27/2021 10:53   DG Chest Port 1 View  Result Date: 05/13/2021 CLINICAL DATA:  Patient coming [facility] from home for unwitnessed fall. Patient reportedly fell once yesterday as well; EMS helped patient up, but patient refused transport to hospital for first fall. Patient reports down for second fall for 8 hours. Pain and bruising down right arm and hip. Pt unable to verbalize coherently at this point, unable to obtain history. EXAM: PORTABLE CHEST 1 VIEW COMPARISON:  03/21/2017 FINDINGS: Mild enlargement of the cardiopericardial silhouette. No mediastinal or hilar masses. Clear lungs.  No convincing pleural effusion or pneumothorax. Skeletal structures are grossly intact. IMPRESSION: No active disease. Electronically Signed   By: Lajean Manes M.D.   On: 04/29/2021 10:50   DG Humerus Right  Result Date: 04/25/2021 CLINICAL DATA:  Patient coming [facility] from home for unwitnessed fall. Patient reportedly fell once yesterday as well; EMS helped patient up, but patient refused transport to hospital for first fall. Patient reports down for second fall for 8 hours. Pain and bruising down right arm and hip. Pt unable to verbalize coherently at this point, unable to obtain history. EXAM: RIGHT HUMERUS - 2+ VIEW COMPARISON:  None. FINDINGS: No fracture or bone lesion. Glenohumeral and elbow joints are normally aligned. Soft tissues are unremarkable. IMPRESSION: No fracture or dislocation. Electronically Signed   By: Lajean Manes M.D.   On: 05/09/2021 10:51   DG HIP UNILAT WITH PELVIS 2-3  VIEWS RIGHT  Result Date: 04/28/2021 CLINICAL DATA:  Patient coming [facility] from home for unwitnessed fall. Patient reportedly fell once yesterday as well; EMS helped patient up, but patient refused transport to hospital for first fall. Patient reports down for second fall for 8 hours. Pain and bruising down right arm and hip. Pt unable to verbalize coherently at this point, unable to obtain history. EXAM: DG HIP (WITH OR WITHOUT PELVIS) 2-3V RIGHT COMPARISON:  None. FINDINGS: No fracture or bone lesion. Hip joints, SI joints and symphysis pubis are normally aligned. Soft tissues are unremarkable. IMPRESSION: 1. No fracture or dislocation. Electronically Signed   By: Lajean Manes M.D.   On: 05/16/2021 10:50     ASSESSMENT AND PLAN    -Multidisciplinary rounds held today  Acute Mental status with encephalopathy    - likely due to hyponatremia    - s/p nephrology evaluation  3%ns initiated     -patient is more aler but confusion still   Hypothermia 26F-RESOLVED -external warming Bare hugger. -neurochecks -PT/INR for coagulopathy   Hypotonic hypovolemic hyponatremia    - SIADH? - tsh, cortisol, acth, cholesterol , urine na and osm, liver dz?   - improved on 3ns   - goal 6meq/24h   ID -continue IV abx as prescibed -follow up cultures  GI/Nutrition GI PROPHYLAXIS as indicated DIET-->TF's as tolerated Constipation protocol as indicated  ENDO - ICU hypoglycemic\Hyperglycemia protocol -check FSBS per protocol   ELECTROLYTES -follow labs as needed -replace as needed -pharmacy consultation   DVT/GI PRX ordered -SCDs  TRANSFUSIONS AS NEEDED MONITOR FSBS ASSESS the need for LABS as needed  Critical care provider statement:   Total critical care time: 33 minutes   Performed by: Lanney Gins MD   Critical care time was exclusive of separately billable procedures and treating other patients.   Critical care was necessary to treat or prevent imminent or life-threatening  deterioration.   Critical care was time spent personally by  me on the following activities: development of treatment plan with patient and/or surrogate as well as nursing, discussions with consultants, evaluation of patient's response to treatment, examination of patient, obtaining history from patient or surrogate, ordering and performing treatments and interventions, ordering and review of laboratory studies, ordering and review of radiographic studies, pulse oximetry and re-evaluation of patient's condition.    Ottie Glazier, M.D.  Pulmonary & Critical Care Medicine       Ottie Glazier, M.D.  Division of [place]

## 2021-05-11 NOTE — Consult Note (Signed)
MEDICATION RELATED CONSULT NOTE  Pharmacy Consult for Hypertonic Saline Monitoring Indication: Severe hyponatremia  Patient Measurements: Height: 5\' 2"  (157.5 cm) Weight: 86.6 kg (190 lb 14.7 oz) IBW/kg (Calculated) : 50.1  Vital Signs: Temp: 99 F (37.2 C) (12/19 0700) Temp Source: Bladder (12/19 0400) BP: 114/37 (12/19 0700) Pulse Rate: 96 (12/19 0700) Intake/Output from previous day: 12/18 0701 - 12/19 0700 In: 2050 [I.V.:550; IV Piggyback:1500] Out: 550 [Urine:550] Intake/Output from this shift: No intake/output data recorded.  Labs: Recent Labs    05/13/2021 0928 05/02/2021 1126 05/11/21 0331  WBC 16.8*  --  17.0*  HGB 8.2*  --  7.1*  HCT 22.2*  --  19.2*  PLT 152  --  143*  APTT 31  --   --   CREATININE 0.97 1.05* 1.35*  MG  --   --  1.5*  PHOS  --   --  3.4  ALBUMIN 3.4*  --   --   PROT 6.1*  --   --   AST 117*  --   --   ALT 38  --   --   ALKPHOS 113  --   --   BILITOT 3.3*  --   --     Estimated Creatinine Clearance: 33.4 mL/min (A) (by C-G formula based on SCr of 1.35 mg/dL (H)).  Medical History: Past Medical History:  Diagnosis Date   Anxiety    Gout    High cholesterol    Hypertension     Medications:  Hypertonic saline at 30 cc/hr  Assessment: Patient is an 80 y/o F with medical history as above who presented to the ED 12/18 with falls / prolonged down time. Patient found to be severely hyponatremic. Baseline sodium 114.   Nephrology has been consulted, recommendations pending  Labs Na: 114 (1126)>114 (1225)>115 (1550)>116 (1850)>116 (2130)>118 (0331)> 121 (0656) (rate of correction in 24h ~37mmol/L)  Goal of Therapy:  Will contact provider if sodium increases > 4 mmol/L in 2 hours or > 6 mmol in 4 hours Increase sodium 8 - 10 mmol in first 24h (goal hourly rate roughly 0.5 mmol/L/hr)  Plan:  --Will continue hypertonic saline at 30 cc/hr --Will continue Na q4h checks will hypertonic saline continued --Pharmacy will continue to follow  along for safe correction  11m, PharmD Pharmacy Resident  05/11/2021 11:27 AM

## 2021-05-11 NOTE — Progress Notes (Addendum)
Has remained confused all day. Finally decided to eat at dinner time. Patient is bruised from head to toe from falls at home. Perineum is red from incontinence of urine and stool. Foley replaced after patient soaked the bed with urine and stool this am. Friend from [place] states she cannot care for self any more.Remains in [facility] as well.

## 2021-05-11 NOTE — Consult Note (Signed)
PHARMACY CONSULT NOTE - FOLLOW UP  Pharmacy Consult for Electrolyte Monitoring and Replacement   Recent Labs: Potassium (mmol/L)  Date Value  05/11/2021 3.6   Magnesium (mg/dL)  Date Value  54/27/0623 1.5 (L)   Calcium (mg/dL)  Date Value  76/28/3151 8.2 (L)   Albumin (g/dL)  Date Value  76/16/0737 3.4 (L)   Phosphorus (mg/dL)  Date Value  10/62/6948 3.4   Sodium (mmol/L)  Date Value  05/11/2021 121 (L)     Assessment: Patient is an 80 y/o F with medical history of anxiety, gout, HLD, HTN  who presented to the ED 12/18 with falls / prolonged down time. Patient found to be severely hyponatremic. Pharmacy was consulted for electrolyte monitoring and replacement.   Labs K 3.7>3.6 Phos 3.4 Mg 1.5  Goal of Therapy:  Electrolytes WNL  Plan:  --Will order 2g Mg sulfate  --No other replacement is currently indicated  --Will continue to monitor and replace electrolytes as clinically indicated   Doloris Hall, PharmD Pharmacy Resident  05/11/2021 3:19 PM

## 2021-05-12 ENCOUNTER — Inpatient Hospital Stay: Payer: Medicare Other | Admitting: Certified Registered Nurse Anesthetist

## 2021-05-12 ENCOUNTER — Encounter: Payer: Self-pay | Admitting: Pulmonary Disease

## 2021-05-12 ENCOUNTER — Inpatient Hospital Stay: Payer: Medicare Other

## 2021-05-12 ENCOUNTER — Encounter: Admission: EM | Disposition: E | Payer: Self-pay | Source: Home / Self Care | Attending: Internal Medicine

## 2021-05-12 ENCOUNTER — Encounter: Admission: EM | Disposition: E | Payer: Medicare Other | Source: Home / Self Care | Attending: Internal Medicine

## 2021-05-12 DIAGNOSIS — K922 Gastrointestinal hemorrhage, unspecified: Secondary | ICD-10-CM

## 2021-05-12 DIAGNOSIS — W19XXXA Unspecified fall, initial encounter: Secondary | ICD-10-CM

## 2021-05-12 DIAGNOSIS — D62 Acute posthemorrhagic anemia: Secondary | ICD-10-CM

## 2021-05-12 DIAGNOSIS — E871 Hypo-osmolality and hyponatremia: Secondary | ICD-10-CM | POA: Diagnosis not present

## 2021-05-12 DIAGNOSIS — L8990 Pressure ulcer of unspecified site, unspecified stage: Secondary | ICD-10-CM | POA: Diagnosis present

## 2021-05-12 DIAGNOSIS — K264 Chronic or unspecified duodenal ulcer with hemorrhage: Secondary | ICD-10-CM | POA: Diagnosis not present

## 2021-05-12 DIAGNOSIS — R140 Abdominal distension (gaseous): Secondary | ICD-10-CM | POA: Diagnosis not present

## 2021-05-12 DIAGNOSIS — M6282 Rhabdomyolysis: Secondary | ICD-10-CM

## 2021-05-12 DIAGNOSIS — Y92009 Unspecified place in unspecified non-institutional (private) residence as the place of occurrence of the external cause: Secondary | ICD-10-CM

## 2021-05-12 DIAGNOSIS — R0602 Shortness of breath: Secondary | ICD-10-CM

## 2021-05-12 HISTORY — PX: ESOPHAGOGASTRODUODENOSCOPY (EGD) WITH PROPOFOL: SHX5813

## 2021-05-12 LAB — CBC
HCT: 17.7 % — ABNORMAL LOW (ref 36.0–46.0)
Hemoglobin: 6.2 g/dL — ABNORMAL LOW (ref 12.0–15.0)
MCH: 36.7 pg — ABNORMAL HIGH (ref 26.0–34.0)
MCHC: 35 g/dL (ref 30.0–36.0)
MCV: 104.7 fL — ABNORMAL HIGH (ref 80.0–100.0)
Platelets: 150 10*3/uL (ref 150–400)
RBC: 1.69 MIL/uL — ABNORMAL LOW (ref 3.87–5.11)
RDW: 15.8 % — ABNORMAL HIGH (ref 11.5–15.5)
WBC: 13.7 10*3/uL — ABNORMAL HIGH (ref 4.0–10.5)
nRBC: 0.5 % — ABNORMAL HIGH (ref 0.0–0.2)

## 2021-05-12 LAB — HEMOGLOBIN AND HEMATOCRIT, BLOOD
HCT: 19.8 % — ABNORMAL LOW (ref 36.0–46.0)
HCT: 22 % — ABNORMAL LOW (ref 36.0–46.0)
Hemoglobin: 7 g/dL — ABNORMAL LOW (ref 12.0–15.0)
Hemoglobin: 8 g/dL — ABNORMAL LOW (ref 12.0–15.0)

## 2021-05-12 LAB — IRON AND TIBC
Iron: 26 ug/dL — ABNORMAL LOW (ref 28–170)
Saturation Ratios: 11 % (ref 10.4–31.8)
TIBC: 235 ug/dL — ABNORMAL LOW (ref 250–450)
UIBC: 209 ug/dL

## 2021-05-12 LAB — BASIC METABOLIC PANEL
Anion gap: 5 (ref 5–15)
BUN: 36 mg/dL — ABNORMAL HIGH (ref 8–23)
CO2: 22 mmol/L (ref 22–32)
Calcium: 8.5 mg/dL — ABNORMAL LOW (ref 8.9–10.3)
Chloride: 97 mmol/L — ABNORMAL LOW (ref 98–111)
Creatinine, Ser: 1.31 mg/dL — ABNORMAL HIGH (ref 0.44–1.00)
GFR, Estimated: 41 mL/min — ABNORMAL LOW (ref >60)
Glucose, Bld: 123 mg/dL — ABNORMAL HIGH (ref 70–99)
Potassium: 3.3 mmol/L — ABNORMAL LOW (ref 3.5–5.1)
Sodium: 124 mmol/L — ABNORMAL LOW (ref 135–145)

## 2021-05-12 LAB — GLUCOSE, CAPILLARY: Glucose-Capillary: 103 mg/dL — ABNORMAL HIGH (ref 70–99)

## 2021-05-12 LAB — SODIUM
Sodium: 126 mmol/L — ABNORMAL LOW (ref 135–145)
Sodium: 127 mmol/L — ABNORMAL LOW (ref 135–145)
Sodium: 130 mmol/L — ABNORMAL LOW (ref 135–145)
Sodium: 127 mmol/L — ABNORMAL LOW (ref 135–145)

## 2021-05-12 LAB — VITAMIN B12: Vitamin B-12: 1346 pg/mL — ABNORMAL HIGH (ref 180–914)

## 2021-05-12 LAB — FERRITIN: Ferritin: 252 ng/mL (ref 11–307)

## 2021-05-12 LAB — PREPARE RBC (CROSSMATCH)

## 2021-05-12 LAB — MAGNESIUM: Magnesium: 2 mg/dL (ref 1.7–2.4)

## 2021-05-12 LAB — FOLATE: Folate: 4.5 ng/mL — ABNORMAL LOW (ref >5.9)

## 2021-05-12 LAB — ABO/RH: ABO/RH(D): O POS

## 2021-05-12 LAB — BRAIN NATRIURETIC PEPTIDE: B Natriuretic Peptide: 1318.5 pg/mL — ABNORMAL HIGH (ref 0.0–100.0)

## 2021-05-12 SURGERY — EMBOLIZATION
Anesthesia: Moderate Sedation

## 2021-05-12 SURGERY — ESOPHAGOGASTRODUODENOSCOPY (EGD) WITH PROPOFOL
Anesthesia: General

## 2021-05-12 MED ORDER — PROPOFOL 500 MG/50ML IV EMUL
INTRAVENOUS | Status: AC
  Filled 2021-05-12: qty 50

## 2021-05-12 MED ORDER — EPHEDRINE SULFATE 50 MG/ML IJ SOLN
INTRAMUSCULAR | Status: DC | PRN
  Administered 2021-05-12: 5 mg via INTRAVENOUS

## 2021-05-12 MED ORDER — SODIUM CHLORIDE 0.9 % IV SOLN
250.0000 mL | INTRAVENOUS | Status: DC
  Administered 2021-05-12 – 2021-05-21 (×2): 250 mL via INTRAVENOUS

## 2021-05-12 MED ORDER — SODIUM CHLORIDE 0.9% IV SOLUTION: Freq: Once | INTRAVENOUS | Status: AC

## 2021-05-12 MED ORDER — LIDOCAINE HCL (CARDIAC) PF 100 MG/5ML IV SOSY
PREFILLED_SYRINGE | INTRAVENOUS | Status: DC | PRN
  Administered 2021-05-12: 100 mg via INTRAVENOUS

## 2021-05-12 MED ORDER — PANTOPRAZOLE SODIUM 40 MG IV SOLR: 40.0000 mg | Freq: Two times a day (BID) | INTRAVENOUS | Status: DC

## 2021-05-12 MED ORDER — NOREPINEPHRINE 4 MG/250ML-% IV SOLN: 2.0000 ug/min | INTRAVENOUS | Status: DC

## 2021-05-12 MED ORDER — GLYCOPYRROLATE 0.2 MG/ML IJ SOLN
INTRAMUSCULAR | Status: DC | PRN
  Administered 2021-05-12: .2 mg via INTRAVENOUS

## 2021-05-12 MED ORDER — POTASSIUM CHLORIDE 10 MEQ/100ML IV SOLN
10.0000 meq | INTRAVENOUS | Status: AC
  Administered 2021-05-12 (×4): 10 meq via INTRAVENOUS
  Filled 2021-05-12 (×5): qty 100

## 2021-05-12 MED ORDER — PROPOFOL 10 MG/ML IV BOLUS
INTRAVENOUS | Status: DC | PRN
  Administered 2021-05-12: 40 mg via INTRAVENOUS

## 2021-05-12 MED ORDER — LACTATED RINGERS IV BOLUS
1000.0000 mL | Freq: Once | INTRAVENOUS | Status: AC
  Administered 2021-05-12: 15:00:00 1000 mL via INTRAVENOUS

## 2021-05-12 MED ORDER — PROPOFOL 500 MG/50ML IV EMUL
INTRAVENOUS | Status: DC | PRN
  Administered 2021-05-12: 150 ug/kg/min via INTRAVENOUS

## 2021-05-12 MED ORDER — TECHNETIUM TC 99M-LABELED RED BLOOD CELLS IV KIT
20.0000 | PACK | Freq: Once | INTRAVENOUS | Status: AC | PRN
  Administered 2021-05-12: 11:00:00 20.92 via INTRAVENOUS

## 2021-05-12 MED ORDER — POTASSIUM CHLORIDE 10 MEQ/100ML IV SOLN
10.0000 meq | Freq: Once | INTRAVENOUS | Status: DC
  Filled 2021-05-12: qty 100

## 2021-05-12 MED ORDER — SODIUM CHLORIDE 0.9% IV SOLUTION
Freq: Once | INTRAVENOUS | Status: AC
  Administered 2021-05-12: 14:00:00 400 mL via INTRAVENOUS

## 2021-05-12 MED ORDER — ORAL CARE MOUTH RINSE
15.0000 mL | Freq: Two times a day (BID) | OROMUCOSAL | Status: DC
  Administered 2021-05-12 – 2021-05-24 (×18): 15 mL via OROMUCOSAL

## 2021-05-12 MED ORDER — PANTOPRAZOLE 80MG IVPB - SIMPLE MED
80.0000 mg | Freq: Once | INTRAVENOUS | Status: AC
  Administered 2021-05-12: 16:00:00 80 mg via INTRAVENOUS
  Filled 2021-05-12: qty 80

## 2021-05-12 MED ORDER — EPINEPHRINE 1 MG/10ML IJ SOSY
PREFILLED_SYRINGE | INTRAMUSCULAR | Status: AC
  Filled 2021-05-12: qty 10

## 2021-05-12 MED ORDER — PANTOPRAZOLE SODIUM 40 MG IV SOLR
40.0000 mg | Freq: Two times a day (BID) | INTRAVENOUS | Status: DC
  Administered 2021-05-16 – 2021-05-18 (×6): 40 mg via INTRAVENOUS
  Filled 2021-05-12 (×6): qty 40

## 2021-05-12 MED ORDER — PANTOPRAZOLE INFUSION (NEW) - SIMPLE MED
8.0000 mg/h | INTRAVENOUS | Status: AC
  Administered 2021-05-12 – 2021-05-15 (×8): 8 mg/h via INTRAVENOUS
  Filled 2021-05-12: qty 80
  Filled 2021-05-12 (×6): qty 100

## 2021-05-12 MED ORDER — POTASSIUM CHLORIDE CRYS ER 20 MEQ PO TBCR: 40.0000 meq | EXTENDED_RELEASE_TABLET | Freq: Once | ORAL | Status: DC

## 2021-05-12 MED ORDER — PANTOPRAZOLE 80MG IVPB - SIMPLE MED
80.0000 mg | Freq: Once | INTRAVENOUS | Status: DC
Start: 2021-05-12 — End: 2021-05-12

## 2021-05-12 MED ORDER — EPINEPHRINE 1 MG/10ML IJ SOSY
PREFILLED_SYRINGE | INTRAMUSCULAR | Status: DC | PRN
  Administered 2021-05-12: .4 mg

## 2021-05-12 MED ORDER — PANTOPRAZOLE INFUSION (NEW) - SIMPLE MED
8.0000 mg/h | INTRAVENOUS | Status: DC
Start: 2021-05-12 — End: 2021-05-12

## 2021-05-12 MED ORDER — PHENYLEPHRINE 40 MCG/ML (10ML) SYRINGE FOR IV PUSH (FOR BLOOD PRESSURE SUPPORT)
PREFILLED_SYRINGE | INTRAVENOUS | Status: DC | PRN
  Administered 2021-05-12 (×6): 160 ug via INTRAVENOUS

## 2021-05-12 MED ORDER — NOREPINEPHRINE 4 MG/250ML-% IV SOLN
INTRAVENOUS | Status: AC
  Administered 2021-05-12: 15:00:00 2 ug/min via INTRAVENOUS
  Filled 2021-05-12: qty 250

## 2021-05-12 MED ORDER — CHLORHEXIDINE GLUCONATE 0.12 % MT SOLN
15.0000 mL | Freq: Two times a day (BID) | OROMUCOSAL | Status: DC
  Administered 2021-05-12 – 2021-05-24 (×20): 15 mL via OROMUCOSAL
  Filled 2021-05-12 (×17): qty 15

## 2021-05-12 NOTE — Consult Note (Addendum)
[facility] SURGICAL CONSULTATION NOTE (initial) - [id number]  HISTORY OF PRESENT ILLNESS (HPI):  80 y.o. female who initially presented to the ED via EMS after being found down at home following a reported fall. Unknown down time. History on presentation was limited secondary to the patient's mental capacity. Work up in the ED revealed hyponatremia to 114, hypokalemia to 3.3, leukocytosis to 16.8, lactic acidosis to 3.6. Temperature on arrival was found to be 93.44F. She was rewarmed. Nephrology was consulted from the ED for hyponatremia management. She was ultimately admitted to [facility]. She seems to do well until this morning when she was found to have bright red blood per rectum. Hgb was found to be 6.2. GI and vascular surgery were consulted and transfusion was ordered. She did undergo NM bleeding scan this afternoon which was concerning for GI bleeding, potentially in the duodenum per radiologist report. Vascular surgery planning embolization potentially today. GI on board; EGD planned.   She is more alert and participatory this afternoon but still history if difficult. Per RN, she has had episodes of black tarry stools mixed with bright red blood, last around 0900. She is currently hemodynamically stable.   Surgery is consulted by PCCM physician Dr. Flora Lipps, MD in this context for evaluation and management of GI bleeding.   PAST MEDICAL HISTORY (PMH):  Past Medical History:  Diagnosis Date   Anxiety    Gout    High cholesterol    Hypertension      PAST SURGICAL HISTORY ([place]):  Past Surgical History:  Procedure Laterality Date   DILATION AND CURETTAGE OF UTERUS       MEDICATIONS:  Prior to Admission medications   Medication Sig Start Date End Date Taking? Authorizing Provider  Cholecalciferol 1000 units tablet Take 5,000 Units by mouth daily.    [provider]  indomethacin (INDOCIN) 25 MG capsule Take 25 mg by mouth every 6 (six) hours as needed.     [provider]  lisinopril (PRINIVIL,ZESTRIL) 40 MG tablet Take 1 tablet (40 mg total) by mouth daily. 03/26/17 03/26/18  Henreitta Leber, MD  medroxyPROGESTERone (PROVERA) 10 MG tablet Take 1 tablet (10 mg total) by mouth daily. Patient not taking: Reported on 05/11/2021 07/29/20 10/22/21  Homero Fellers, MD  Melatonin 5 MG TABS Take 2 tablets by mouth at bedtime.    [provider]  meloxicam (MOBIC) 7.5 MG tablet Take 7.5 mg by mouth 2 (two) times daily. Patient not taking: Reported on 05/11/2021    [provider]  omega-3 acid ethyl esters (LOVAZA) 1 g capsule Take 1 g by mouth daily.    [provider]  pravastatin (PRAVACHOL) 20 MG tablet Take 20 mg by mouth at bedtime.    [provider]     ALLERGIES:  No Known Allergies   SOCIAL HISTORY:  Social History   Socioeconomic History   Marital status: Single    Spouse name: Not on file   Number of children: Not on file   Years of education: Not on file   Highest education level: Not on file  Occupational History    Employer: RETIRED  Tobacco Use   Smoking status: Never   Smokeless tobacco: Never  Vaping Use   Vaping Use: Never used  Substance and Sexual Activity   Alcohol use: Yes    Comment: occasional   Drug use: No   Sexual activity: Not on file  Other Topics Concern   Not on file  Social History Narrative   Not on file   Social Determinants of Health   Financial Resource Strain: Not on file  Food Insecurity: Not on file  Transportation Needs: Not on file  Physical Activity: Not on file  Stress: Not on file  Social Connections: Not on file  Intimate Partner Violence: Not on file     FAMILY HISTORY:  Family History  Problem Relation Age of Onset   Heart disease Father    COPD Neg Hx    Diabetes Mellitus II Neg Hx       REVIEW OF SYSTEMS:  Review of Systems  Unable to perform ROS: Mental acuity  Gastrointestinal:  Positive for blood in stool and melena.    VITAL SIGNS:  Temp:  [97.4 F (36.3 C)-98.6 F (37 C)] 98.6 F (37 C) (12/20 1100) Pulse Rate:  [48-115] 95 (12/20 1200) Resp:  [16-30] 19 (12/20 1200) BP: (91-144)/(41-77) 124/48 (12/20 1200) SpO2:  [91 %-100 %] 100 % (12/20 1200) FiO2 (%):  [28 %] 28 % (12/20 1100) Weight:  [88 kg] 88 kg (12/20 0500)     Height: 5\' 2"  (157.5 cm) Weight: 88 kg BMI (Calculated): 35.47   INTAKE/OUTPUT:  12/19 0701 - 12/20 0700 In: 731.1 [I.V.:681.1; IV Piggyback:50] Out: 320 [Urine:320]  PHYSICAL EXAM:  Physical Exam Vitals and nursing note reviewed. Exam conducted with a chaperone present.  Constitutional:      General: She is not in acute distress.    Appearance: She is obese. She is not ill-appearing.     Interventions: Nasal cannula in place.     Comments: Patient is more alert, unable to provide much reliable history, RN at bedside as well  HENT:     Head: Normocephalic and atraumatic.     Mouth/Throat:     Mouth: Mucous membranes are dry.     Pharynx: Oropharynx is clear.  Eyes:     Extraocular Movements: Extraocular movements intact.     Conjunctiva/sclera: Conjunctivae normal.  Cardiovascular:     Rate and Rhythm: Normal rate.     Pulses: Normal pulses.     Heart sounds: No murmur heard. Pulmonary:     Effort: Pulmonary effort is normal. No respiratory distress.  Abdominal:     Tenderness: There is no abdominal tenderness. There is no guarding or rebound.  Genitourinary:    Comments: RN reports black tarry stools mixed with bright red blood  Musculoskeletal:     Right lower leg: No edema.     Left lower leg: No edema.  Skin:    General: Skin is warm and dry.  Neurological:     Mental Status: She is alert.     Comments: Unable to reliably assess   Psychiatric:     Comments: Unable to reliably assess      Labs:  CBC Latest Ref Rng & Units 05/11/2021 05/11/2021 05/17/2021  WBC 4.0 - 10.5 K/uL 13.7(H) 17.0(H) 16.8(H)  Hemoglobin 12.0 - 15.0 g/dL 6.2(L) 7.1(L) 8.2(L)   Hematocrit 36.0 - 46.0 % 17.7(L) 19.2(L) 22.2(L)  Platelets 150 - 400 K/uL 150 143(L) 152   CMP Latest Ref Rng & Units 04/26/2021 05/05/2021 05/11/2021  Glucose 70 - 99 mg/dL - 05/13/2021) -  BUN 8 - 23 mg/dL - 762(G) -  Creatinine 31(D - 1.00 mg/dL - 1.76) -  Sodium 1.60(V - 145 mmol/L 126(L) 124(L) 122(L)  Potassium 3.5 - 5.1 mmol/L - 3.3(L) -  Chloride 98 - 111 mmol/L - 97(L) -  CO2 22 - 32  mmol/L - 22 -  Calcium 8.9 - 10.3 mg/dL - 8.5(L) -  Total Protein 6.5 - 8.1 g/dL - - -  Total Bilirubin 0.3 - 1.2 mg/dL - - -  Alkaline Phos 38 - 126 U/L - - -  AST 15 - 41 U/L - - -  ALT 0 - 44 U/L - - -    Imaging studies:   NM Bleeding Scan (05/21/2021) personally reviewed and discussed with the reading radiologist, and radiology report reviewed:  IMPRESSION: Signs of active bleeding favored to originate from the descending duodenum with blood products passing into the jejunum on more delayed images and intermittently throughout the scan.   Assessment/Plan: (ICD-10's: K68.2) 80 y.o. female presenting after being found down with hypothermia, hyponatremia to 114 and admitted to ICU who subsequently was found to have BRBPR concerning for GI bleed.    - Greatly appreciate vascular surgery assistance with angiography for localization +/- embolization if feasible. If she continues to bleed after localization and embolization, then she would likely require surgical resection. Again, we certainly would need localization prior to doing surgery. We will of course follow closely.    - GI on board as well; appreciate assistance; EGD  - Monitor H&H; agree with transfusion for hgb <7  - Monitor on-going bowel function - Pain control prn; antiemetics - Further management per primary service; we will be available  All of the above findings and recommendations were discussed with the patient, and all of patient's questions were answered to her expressed satisfaction.  Thank you for the opportunity to  participate in this patient's care.   -- Edison Simon, PA-C [facility] 04/26/2021, 12:40 PM [phone] M-F: 7am - 4pm

## 2021-05-12 NOTE — Consult Note (Signed)
MEDICATION RELATED CONSULT NOTE  Pharmacy Consult for Hypertonic Saline Monitoring Indication: Severe hyponatremia  Patient Measurements: Height: 5\' 2"  (157.5 cm) Weight: 88 kg (194 lb 0.1 oz) IBW/kg (Calculated) : 50.1  Vital Signs: Temp: 97.4 F (36.3 C) (12/20 0400) Temp Source: Oral (12/20 0400) BP: 108/52 (12/20 0600) Pulse Rate: 115 (12/20 0600) Intake/Output from previous day: 12/19 0701 - 12/20 0700 In: 731.1 [I.V.:681.1; IV Piggyback:50] Out: 320 [Urine:320] Intake/Output from this shift: No intake/output data recorded.  Labs: Recent Labs    26-May-2021 0928 May 26, 2021 1126 05/11/21 0331 05/15/2021 0246  WBC 16.8*  --  17.0* 13.7*  HGB 8.2*  --  7.1* 6.2*  HCT 22.2*  --  19.2* 17.7*  PLT 152  --  143* 150  APTT 31  --   --   --   CREATININE 0.97 1.05* 1.35* 1.31*  MG  --   --  1.5* 2.0  PHOS  --   --  3.4  --   ALBUMIN 3.4*  --   --   --   PROT 6.1*  --   --   --   AST 117*  --   --   --   ALT 38  --   --   --   ALKPHOS 113  --   --   --   BILITOT 3.3*  --   --   --     Estimated Creatinine Clearance: 34.7 mL/min (A) (by C-G formula based on SCr of 1.31 mg/dL (H)).  Medical History: Past Medical History:  Diagnosis Date   Anxiety    Gout    High cholesterol    Hypertension     Medications:  Hypertonic saline at 30 cc/hr  Assessment: Patient is an 80 y/o F with medical history as above who presented to the ED 12/18 with falls / prolonged down time. Patient found to be severely hyponatremic. Baseline sodium 114.   Nephrology has been consulted, recommendations pending  Labs Na:121 [id number] (1102)>120 (1456)>123 (1907)> 122 (2246)> 124 (0246)> 126 (0630)  (rate of correction in 24h ~49mmol/L)  Goal of Therapy:  Will contact provider if sodium increases > 4 mmol/L in 2 hours or > 6 mmol in 4 hours Increase sodium 8 - 10 mmol in first 24h (goal hourly rate roughly 0.5 mmol/L/hr)  Plan:  --Will continue hypertonic saline at 30 cc/hr --Will  continue Na q4h checks will hypertonic saline continued --Pharmacy will continue to follow along for safe correction  4m, PharmD Pharmacy Resident  04/26/2021 7:15 AM

## 2021-05-12 NOTE — Progress Notes (Signed)
Patient Hypotensive with BP around 60 systolic post EGD. LR 1 liter bolus started.

## 2021-05-12 NOTE — Anesthesia Procedure Notes (Signed)
Date/Time: May 21, 2021 1:51 PM Performed by: Joanette Gula, Katelind Pytel, CRNA Pre-anesthesia Checklist: Patient identified, Emergency Drugs available, Suction available, Patient being monitored and Timeout performed Patient Re-evaluated:Patient Re-evaluated prior to induction Oxygen Delivery Method: Simple face mask Induction Type: IV induction

## 2021-05-12 NOTE — Progress Notes (Signed)
Patient with active Bleeding based on bleeding scans Vascular, General Surgery and GI consulted  Awaiting for plan of care. Patient has no family and is somewhat confused. We will need to sign whatever consent is needed as an emergent basis since patient has no family around.      Lucie Leather, M.D.  [facility]  Medical Director [facility] Medical Director [facility] Cardio-Pulmonary Department

## 2021-05-12 NOTE — Progress Notes (Signed)
CRITICAL CARE PROGRESS NOTE    Name: Angela Sullivan MRN: [id number] DOB: 13-Nov-1940     LOS: 2   SYNOPSIS 80 yo with PMH as below came in after being found down in cold interior.  Found to have hypothermia 59F and hyponatremia 114 with confusion. In ER nephrology was consulted and recommendation for 3NS at 30cc/hr.   CC Follow up severe hyponatremia Severe mental status changes  EVENTS 05/11/21- Patient remains with encephalopahty. Na is slowly imrpoving,  12/20 increased WOB, inability to maintain secretions poor cough reflex  INTERVAL CHANGES Na is 126 Increased WOB Increased oral secretions     Lines/tubes : Urethral Catheter Jenna, RN Temperature probe (Active)    Microbiology/Sepsis markers: Results for orders placed or performed during the hospital encounter of 05/19/2021  Urine Culture     Status: None   Collection Time: 05/15/2021  9:26 AM   Specimen: Urine, Random  Result Value Ref Range Status   Specimen Description   Final    URINE, RANDOM Performed at [facility], [address], [place], [place] [address]    Special Requests   Final    NONE Performed at [facility], [address], [place], [place] [address]    Culture   Final    NO GROWTH Performed at [facility], [address] [place], [place] [address]    Report Status 05/11/2021 FINAL  Final  Resp Panel by RT-PCR (Flu A&B, Covid)     Status: None   Collection Time: 04/23/2021  9:33 AM   Specimen: Nasopharyngeal(NP) swabs in vial transport medium  Result Value Ref Range Status   SARS Coronavirus 2 by RT PCR NEGATIVE NEGATIVE Final    Comment: (NOTE) SARS-CoV-2 target nucleic acids are NOT DETECTED.  The SARS-CoV-2 RNA is generally detectable in upper respiratory specimens during the acute  phase of infection. The lowest concentration of SARS-CoV-2 viral copies this assay can detect is 138 copies/mL. A negative result does not preclude SARS-Cov-2 infection and should not be used as the sole basis for treatment or other patient management decisions. A negative result may occur with  improper specimen collection/handling, submission of specimen other than nasopharyngeal swab, presence of viral mutation(s) within the areas targeted by this assay, and inadequate number of viral copies(<138 copies/mL). A negative result must be combined with clinical observations, patient history, and epidemiological information. The expected result is Negative.  Fact Sheet for Patients:  BloggerCourse.com  Fact Sheet for Healthcare Providers:  SeriousBroker.it  This test is no t yet approved or cleared by the [place] FDA and  has been authorized for detection and/or diagnosis of SARS-CoV-2 by FDA under an Emergency Use Authorization (EUA). This EUA will remain  in effect (meaning this test can be used) for the duration of the COVID-19 declaration under Section 564(b)(1) of the Act, 21 U.S.C.section 360bbb-3(b)(1), unless the authorization is terminated  or revoked sooner.       Influenza A by PCR NEGATIVE NEGATIVE Final   Influenza B by PCR NEGATIVE NEGATIVE Final    Comment: (NOTE) The Xpert Xpress SARS-CoV-2/FLU/RSV plus assay is intended as an aid in the diagnosis of influenza from Nasopharyngeal swab specimens and should not be used as a sole basis for treatment. Nasal washings and aspirates are unacceptable for Xpert Xpress SARS-CoV-2/FLU/RSV testing.  Fact Sheet for Patients: BloggerCourse.com  Fact Sheet for Healthcare Providers: SeriousBroker.it  This test is not yet approved or cleared by the [place] FDA and has  been authorized for detection and/or diagnosis of  SARS-CoV-2 by FDA under an Emergency Use Authorization (EUA). This EUA will remain in effect (meaning this test can be used) for the duration of the COVID-19 declaration under Section 564(b)(1) of the Act, 21 U.S.C. section 360bbb-3(b)(1), unless the authorization is terminated or revoked.  Performed at [facility], [address] [place], [place] [address]   Blood Culture (routine x 2)     Status: None (Preliminary result)   Collection Time: 05/21/2021  9:34 AM   Specimen: BLOOD  Result Value Ref Range Status   Specimen Description BLOOD RIGHT ANTECUBITAL  Final   Special Requests   Final    BOTTLES DRAWN AEROBIC AND ANAEROBIC Blood Culture results may not be optimal due to an excessive volume of blood received in culture bottles   Culture   Final    NO GROWTH 2 DAYS Performed at [facility], [address], [place], [place] [address]    Report Status PENDING  Incomplete  MRSA Next Gen by PCR, Nasal     Status: None   Collection Time: 05/21/2021  6:16 PM   Specimen: Nasal Mucosa; Nasal Swab  Result Value Ref Range Status   MRSA by PCR Next Gen NOT DETECTED NOT DETECTED Final    Comment: (NOTE) The GeneXpert MRSA Assay (FDA approved for NASAL specimens only), is one component of a comprehensive MRSA colonization surveillance program. It is not intended to diagnose MRSA infection nor to guide or monitor treatment for MRSA infections. Test performance is not FDA approved in patients less than 44 years old. Performed at [facility], [address] [place], [place] [address]   Blood Culture (routine x 2)     Status: None (Preliminary result)   Collection Time: 04/23/2021  6:50 PM   Specimen: BLOOD  Result Value Ref Range Status   Specimen Description BLOOD BLOOD RIGHT HAND  Final   Special Requests   Final    BOTTLES DRAWN AEROBIC ONLY Blood Culture results may not be optimal due to an inadequate volume of blood received in culture bottles   Culture    Final    NO GROWTH 2 DAYS Performed at [facility], [address] [place], [place] [address]    Report Status PENDING  Incomplete    Anti-infectives:  Anti-infectives (From admission, onward)    None        MEDICATIONS   Current Medication:  Current Facility-Administered Medications:    0.9 %  sodium chloride infusion (Manually program via Guardrails IV Fluids), , Intravenous, Once, Ouma, Hubbard Hartshorn, NP   aspirin EC tablet 325 mg, 325 mg, Oral, Daily, Karna Christmas, Fuad, MD, 325 mg at 05/11/21 1000   Chlorhexidine Gluconate Cloth 2 % PADS 6 each, 6 each, Topical, Q0600, Vida Rigger, MD, 6 each at 05/11/21 1000   feeding supplement (ENSURE ENLIVE / ENSURE PLUS) liquid 237 mL, 237 mL, Oral, TID BM, Aleskerov, Fuad, MD, 237 mL at 05/11/21 1935   multivitamin with minerals tablet 1 tablet, 1 tablet, Oral, Daily, Vida Rigger, MD   [START ON 05/15/2021] pantoprazole (PROTONIX) injection 40 mg, 40 mg, Intravenous, Q12H, Ouma, Hubbard Hartshorn, NP   polyethylene glycol (MIRALAX / GLYCOLAX) packet 17 g, 17 g, Oral, Daily PRN, Karna Christmas, Fuad, MD   sodium chloride (hypertonic) 3 % solution, , Intravenous, Continuous, Quale, Mark, MD, Last Rate: 30 mL/hr at 04/25/2021 0600, Infusion Verify at 05/07/2021 0600     PHYSICAL EXAMINATION   Vital Signs:  Temp:  [97.4 F (36.3 C)-99.4 F (37.4 C)] 97.4 F (36.3 C) (12/20 0400) Pulse Rate:  [48-115] 115 (12/20 0600) Resp:  [15-26] 25 (12/20 0600) BP: (91-122)/(41-77) 108/52 (12/20 0600) SpO2:  [94 %-100 %] 97 % (12/20 0600) Weight:  [88 kg] 88 kg (12/20 0500)    REVIEW OF SYSTEMS  PATIENT IS UNABLE TO PROVIDE COMPLETE REVIEW OF SYSTEMS DUE TO TOXIC METABOLIC ENCEPHALOPATHY FROM SEVERE HYPONATREMIA    PHYSICAL EXAMINATION:  GENERAL:critically ill appearing, +resp distress EYES: Pupils equal, round, reactive to light.  No scleral icterus.  MOUTH: Moist mucosal membrane.  NECK: Supple.  PULMONARY:  +rhonchi, +wheezing CARDIOVASCULAR: S1 and S2.  No murmurs  GASTROINTESTINAL: +distended. Positive bowel sounds.  MUSCULOSKELETAL: +edema.  NEUROLOGIC: lethargic SKIN:intact,warm,dry   LAB RESULTS:  Basic Metabolic Panel: Recent Labs  Lab 05-20-21 0928 2021/05/20 1126 20-May-2021 1225 05/11/21 0331 05/11/21 0656 05/11/21 1456 05/11/21 1907 05/11/21 2246 05/02/2021 0246 04/28/2021 0630  NA 114* 114*   < > 118*   < > 120* 123* 122* 124* 126*  K 3.3* 3.7  --  3.6  --   --   --   --  3.3*  --   CL 82* 83*  --  90*  --   --   --   --  97*  --   CO2 20* 21*  --  17*  --   --   --   --  22  --   GLUCOSE 96 101*  --  82  --   --   --   --  123*  --   BUN 23 23  --  28*  --   --   --   --  36*  --   CREATININE 0.97 1.05*  --  1.35*  --   --   --   --  1.31*  --   CALCIUM 8.4* 8.5*  --  8.2*  --   --   --   --  8.5*  --   MG  --   --   --  1.5*  --   --   --   --  2.0  --   PHOS  --   --   --  3.4  --   --   --   --   --   --    < > = values in this interval not displayed.    Liver Function Tests: Recent Labs  Lab 05-20-2021 0928  AST 117*  ALT 38  ALKPHOS 113  BILITOT 3.3*  PROT 6.1*  ALBUMIN 3.4*    No results for input(s): LIPASE, AMYLASE in the last 168 hours. No results for input(s): AMMONIA in the last 168 hours. CBC: Recent Labs  Lab May 20, 2021 0928 05/11/21 0331 05/11/2021 0246  WBC 16.8* 17.0* 13.7*  NEUTROABS 14.4*  --   --   HGB 8.2* 7.1* 6.2*  HCT 22.2* 19.2* 17.7*  MCV 100.9* 101.6* 104.7*  PLT 152 143* 150    Cardiac Enzymes: Recent Labs  Lab 05-20-2021 0928  CKTOTAL 1,181*    BNP: Invalid input(s): POCBNP CBG: Recent Labs  Lab 20-May-2021 0923 05/20/2021 1811 May 20, 2021 1951 05-20-21 2328  GLUCAP 100* 80 82 85        IMAGING RESULTS:  Imaging:  @PROBHOSP @ DG Chest 1 View  Result Date: 04/29/2021 CLINICAL DATA:  80 year old female with shortness of breath and abdominal distension. EXAM: CHEST  1 VIEW COMPARISON:  Portable chest 05-20-21 and  earlier. FINDINGS: Portable AP semi upright view at 0459 hours. More rotated to the right. Stable cardiomegaly and mediastinal contours. Stable lung volumes. Allowing for portable technique the lungs are clear. No pneumothorax or pleural effusion identified. No acute osseous abnormality identified. Negative visible bowel gas. IMPRESSION: Cardiomegaly.  No acute cardiopulmonary abnormality. Electronically Signed   By: Odessa Fleming M.D.   On: 04/29/2021 05:34   DG Abd 1 View  Result Date: 04/26/2021 CLINICAL DATA:  80 year old female with shortness of breath and abdominal distension. EXAM: ABDOMEN - 1 VIEW COMPARISON:  CT Abdomen and Pelvis 01/28/2006. FINDINGS: Portable AP supine view at 0502 hours. Moderately distended gas-filled stomach. Gas containing but nondilated large bowel in the abdomen and visible pelvis. A few nondilated gas-filled small bowel loops are visible in the right lower abdomen. No definite pneumoperitoneum on this supine view. Visible lung bases appear negative. No acute osseous abnormality identified. IMPRESSION: 1. Moderately gas distended stomach. Consider NG tube decompression. 2. But elsewhere the bowel-gas pattern is within normal limits. Electronically Signed   By: Odessa Fleming M.D.   On: 05/14/2021 05:35     ASSESSMENT AND PLAN   80 yo white female with severe hyponatremia with active GIB with toxic metabolic encephalopathy probably sever dehydration with ARF with Rhabdomyolysis  Severe ACUTE Hypoxic and Hypercapnic Respiratory distress High risk for intubation and cardiac arrest Frequent NTS Oxygen as needed   NEUROLOGY ACUTE TOXIC METABOLIC ENCEPHALOPATHY High risk for intubation   ACUTE KIDNEY INJURY/Renal Failure -continue Foley Catheter-assess need -Avoid nephrotoxic agents -Follow urine output, BMP -Ensure adequate renal perfusion, optimize oxygenation -Renal dose medications Follow up Nephrology recs   Intake/Output Summary (Last 24 hours) at 05/03/2021  0748 Last data filed at 05/03/2021 0600 Gross per 24 hour  Intake 731.07 ml  Output 320 ml  Net 411.07 ml   SEVERE HYPONATREMIA-HYPOVOLUMIC 3% saline   INFECTIOUS DISEASE -continue antibiotics as prescribed -follow up cultures  +GIB GI CONSULTED BLEEDING SCAN PENDING PPI BID KEEP NPO  ACUTE ANEMIA- TRANSFUSE AS NEEDED CONSIDER TRANSFUSION  IF HGB<7 DVT PRX with TED/SCD's ONLY   ENDO - ICU hypoglycemic\Hyperglycemia protocol -check FSBS per protocol   ELECTROLYTES -follow labs as needed -replace as needed -pharmacy consultation and following     DVT/GI PRX  assessed I Assessed the need for Labs I Assessed the need for Foley I Assessed the need for Central Venous Line Family Discussion when available I Assessed the need for Mobilization I made an Assessment of medications to be adjusted accordingly Safety Risk assessment completed  CASE DISCUSSED IN MULTIDISCIPLINARY ROUNDS WITH ICU TEAM     Critical Care Time devoted to patient care services described in this note is 55 minutes.  Critical care was necessary to treat /prevent imminent and life-threatening deterioration. Overall, patient is critically ill, prognosis is guarded.  Patient with Multiorgan failure and at high risk for cardiac arrest and death.    Lucie Leather, M.D.  [facility]  Medical Director [facility] Medical Director [facility] Cardio-Pulmonary Department

## 2021-05-12 NOTE — Progress Notes (Signed)
Patient arrived in ENDO for Upper Edoscopy with Dr Allegra Lai due to (+) Nuclear Med Bleeding Scan, gastrointestinal bleed, melena.  CCU RN accompanied patient.  Unit of PRBC started at 13:30 in CCU prior to transport to ENDO. CRNA continue to monitor blood infusion and vital signs will be documented on flow sheet.

## 2021-05-12 NOTE — Anesthesia Preprocedure Evaluation (Signed)
Anesthesia Evaluation  Patient identified by MRN, date of birth, ID band Patient awake    Reviewed: Allergy & Precautions, NPO status , Patient's Chart, lab work & pertinent test results  Airway Mallampati: III  TM Distance: >3 FB Neck ROM: full  Mouth opening: Limited Mouth Opening  Dental  (+) Upper Dentures, Edentulous Lower   Pulmonary neg pulmonary ROS, shortness of breath, with exertion, at rest and lying,    Pulmonary exam normal + rhonchi  + decreased breath sounds+ wheezing      Cardiovascular Exercise Tolerance: Poor hypertension, Pt. on medications + DOE  negative cardio ROS Normal cardiovascular exam Rhythm:Regular     Neuro/Psych negative neurological ROS  negative psych ROS   GI/Hepatic negative GI ROS, Neg liver ROS,   Endo/Other  negative endocrine ROSMorbid obesity  Renal/GU negative Renal ROS  negative genitourinary   Musculoskeletal negative musculoskeletal ROS (+)   Abdominal   Peds  Hematology negative hematology ROS (+) anemia ,   Anesthesia Other Findings Past Medical History: No date: Anxiety No date: Gout No date: High cholesterol No date: Hypertension  Past Surgical History: No date: DILATION AND CURETTAGE OF UTERUS  BMI    Body Mass Index: 35.48 kg/m      Reproductive/Obstetrics negative OB ROS                             Anesthesia Physical Anesthesia Plan  ASA: 4 and emergent  Anesthesia Plan: General   Post-op Pain Management:    Induction: Intravenous  PONV Risk Score and Plan: Propofol infusion and TIVA  Airway Management Planned: Natural Airway  Additional Equipment:   Intra-op Plan:   Post-operative Plan:   Informed Consent: I have reviewed the patients History and Physical, chart, labs and discussed the procedure including the risks, benefits and alternatives for the proposed anesthesia with the patient or authorized  representative who has indicated his/her understanding and acceptance.     Dental Advisory Given  Plan Discussed with: CRNA and Surgeon  Anesthesia Plan Comments:         Anesthesia Quick Evaluation

## 2021-05-12 NOTE — Consult Note (Signed)
[facility] Vascular Consult Note  MRN : [id number]  Angela Sullivan is a 80 y.o. (1940/07/13) female who presents with chief complaint of  Chief Complaint  Patient presents with   Fall  .  History of Present Illness:   I am asked to see Angela Sullivan by Dr. Merrilee Seashore.  Patient is an 80 year old female who was admitted to the hospital 2 days ago after being found down for an estimated 8+ hours.  Earlier today she was noted to have severe GI bleed she is required transfusions.  A nuclear medicine study was obtained which suggested bleeding from the duodenum.  Subsequently gastroenterology, general surgery and vascular surgery have been asked to evaluate.  This afternoon the patient underwent EGD which revealed a ulcer with an exposed vessel in its base.  Initial treatment included epinephrine injection as well as clipping this appears to have stabilized her GI bleed.  I have asked for an opinion as to any other further treatment at this time.  No outpatient medications have been marked as taking for the 2021/05/20 encounter [facility] Encounter).    Past Medical History:  Diagnosis Date   Anxiety    Gout    High cholesterol    Hypertension     Past Surgical History:  Procedure Laterality Date   DILATION AND CURETTAGE OF UTERUS      Social History Social History   Tobacco Use   Smoking status: Never   Smokeless tobacco: Never  Vaping Use   Vaping Use: Never used  Substance Use Topics   Alcohol use: Yes    Comment: occasional   Drug use: No    Family History Family History  Problem Relation Age of Onset   Heart disease Father    COPD Neg Hx    Diabetes Mellitus II Neg Hx     No Known Allergies   REVIEW OF SYSTEMS (Negative unless checked)  Constitutional: Weight loss  Fever  Chills Cardiac: Chest pain   Chest pressure   Palpitations   Shortness of breath when laying flat   Shortness of breath at rest   Shortness of breath  with exertion. Vascular:  Pain in legs with walking   Pain in legs at rest   Pain in legs when laying flat   Claudication   Pain in feet when walking  Pain in feet at rest  Pain in feet when laying flat   History of DVT   Phlebitis   Swelling in legs   Varicose veins   Non-healing ulcers Pulmonary:   Uses home oxygen   Productive cough   Hemoptysis   Wheeze  COPD   Asthma Neurologic:  Dizziness  Blackouts   Seizures   History of stroke   History of TIA  Aphasia   Temporary blindness   Dysphagia   Weakness or numbness in arms   Weakness or numbness in legs Musculoskeletal:  Arthritis   Joint swelling   Joint pain   Low back pain Hematologic:  Easy bruising  Easy bleeding   Hypercoagulable state   Anemic  Hepatitis Gastrointestinal:  Blood in stool   Vomiting blood  Gastroesophageal reflux/heartburn   Difficulty swallowing. Genitourinary:  Chronic kidney disease   Difficult urination  Frequent urination  Burning with urination   Blood in urine Skin:  Rashes   Ulcers   Wounds Psychological:  History of anxiety    History of major depression.  Physical Examination  Vitals:   05/10/2021 1600 05/06/2021 1700 05/11/2021  1745 04/23/2021 1800  BP: (!) 104/47 121/64 114/65 (!) 110/44  Pulse: 60 92 89 (!) 104  Resp: Temp:      TempSrc:      SpO2: 99% 100% 99% 100%  Weight:      Height:       Body mass index is 35.48 kg/m. Gen:  WD/WN, NAD Head: [place]/AT, No temporalis wasting. Prominent temp pulse not noted. Ear/Nose/Throat: Hearing grossly intact, nares w/o erythema or drainage, oropharynx w/o Erythema/Exudate Eyes: PERRLA, EOMI.  Neck: Supple, no nuchal rigidity.  No bruit or JVD.  Pulmonary:  Good air movement, clear to auscultation bilaterally.  Cardiac: RRR, normal S1, S2, no Murmurs, rubs or gallops. Vascular:  Vessel Right Left  Radial Palpable Palpable  PT Not palpable Not palpable   DP Trace palpable Trace palpable  Gastrointestinal: soft, non-tender/mildly distended. No guarding/reflex. No masses, surgical incisions, or scars. Musculoskeletal: M/S 5/5 throughout.  Extremities without ischemic changes.  No deformity or atrophy. No edema. Neurologic: CN 2-12 intact. Pain and light touch intact in extremities.  Symmetrical.  Speech is fluent. Motor exam as listed above. Psychiatric: Judgment intact, Mood & affect appropriate for pt's clinical situation. Dermatologic: No rashes or ulcers noted.  No cellulitis or open wounds. Lymph : No Cervical, Axillary, or Inguinal lymphadenopathy.  Diagnostic Studies I have personally reviewed the nuclear medicine study and it does appear to have tracer initially just to the right of midline under the liver consistent with hemorrhage from a duodenal source    CBC Lab Results  Component Value Date   WBC 13.7 (H) 05/16/2021   HGB 8.0 (L) 05/03/2021   HCT 22.0 (L) 05/18/2021   MCV 104.7 (H) 05/13/2021   PLT 150 05/07/2021    BMET    Component Value Date/Time   NA 130 (L) 05/17/2021 1807   K 3.3 (L) 05/11/2021 0246   CL 97 (L) 04/28/2021 0246   CO2 22 05/02/2021 0246   GLUCOSE 123 (H) 05/11/2021 0246   BUN 36 (H) 05/13/2021 0246   CREATININE 1.31 (H) 04/29/2021 0246   CALCIUM 8.5 (L) 05/11/2021 0246   GFRNONAA 41 (L) 05/14/2021 0246   GFRAA >60 03/26/2017 0024   Estimated Creatinine Clearance: 34.7 mL/min (A) (by C-G formula based on SCr of 1.31 mg/dL (H)).  COAG Lab Results  Component Value Date   INR 1.4 (H) 05/20/2021    Radiology DG Chest 1 View  Result Date: 05/18/2021 CLINICAL DATA:  80 year old female with shortness of breath and abdominal distension. EXAM: CHEST  1 VIEW COMPARISON:  Portable chest 2021/05/20 and earlier. FINDINGS: Portable AP semi upright view at 0459 hours. More rotated to the right. Stable cardiomegaly and mediastinal contours. Stable lung volumes. Allowing for portable technique the  lungs are clear. No pneumothorax or pleural effusion identified. No acute osseous abnormality identified. Negative visible bowel gas. IMPRESSION: Cardiomegaly.  No acute cardiopulmonary abnormality. Electronically Signed   By: Odessa Fleming M.D.   On: 04/27/2021 05:34   DG Forearm Right  Result Date: May 20, 2021 CLINICAL DATA:  Patient coming [facility] from home for unwitnessed fall. Patient reportedly fell once yesterday as well; EMS helped patient up, but patient refused transport to hospital for first fall. Patient reports down for second fall for 8 hours. Pain and bruising down right arm and hip. Pt unable to verbalize coherently at this point, unable to obtain history. EXAM: RIGHT FOREARM - 2 VIEW COMPARISON:  None. FINDINGS: No fracture or bone lesion. Elbow and  wrist joints are normally aligned. There is diffuse subcutaneous soft tissue edema, nonspecific. IMPRESSION: No fracture or dislocation. Electronically Signed   By: Amie Portland M.D.   On: 03-Jun-2021 10:52   DG Abd 1 View  Result Date: 05/18/2021 CLINICAL DATA:  80 year old female with shortness of breath and abdominal distension. EXAM: ABDOMEN - 1 VIEW COMPARISON:  CT Abdomen and Pelvis 01/28/2006. FINDINGS: Portable AP supine view at 0502 hours. Moderately distended gas-filled stomach. Gas containing but nondilated large bowel in the abdomen and visible pelvis. A few nondilated gas-filled small bowel loops are visible in the right lower abdomen. No definite pneumoperitoneum on this supine view. Visible lung bases appear negative. No acute osseous abnormality identified. IMPRESSION: 1. Moderately gas distended stomach. Consider NG tube decompression. 2. But elsewhere the bowel-gas pattern is within normal limits. Electronically Signed   By: Odessa Fleming M.D.   On: 05/08/2021 05:35   CT HEAD WO CONTRAST ( )  Result Date: 06/03/21 CLINICAL DATA:  Fall.  Periorbital swelling.  Neck pain. EXAM: CT HEAD WITHOUT CONTRAST CT MAXILLOFACIAL WITHOUT  CONTRAST CT CERVICAL SPINE WITHOUT CONTRAST TECHNIQUE: Multidetector CT imaging of the head, cervical spine, and maxillofacial structures were performed using the standard protocol without intravenous contrast. Multiplanar CT image reconstructions of the cervical spine and maxillofacial structures were also generated. COMPARISON:  None. FINDINGS: CT HEAD FINDINGS Brain: No evidence of acute infarction, hemorrhage, hydrocephalus, extra-axial collection or mass lesion/mass effect. Patchy white matter hypoattenuation noted bilaterally consistent with mild chronic microvascular ischemic change. Vascular: No hyperdense vessel or unexpected calcification. Skull: Normal. Negative for fracture or focal lesion. Other: None. CT MAXILLOFACIAL FINDINGS Osseous: No fracture or mandibular dislocation. No destructive process. Orbits: Right preseptal periorbital soft tissue swelling/hemorrhage. No injury to the right globe or postseptal orbit. Normal left globe and orbit. Sinuses: Minimal dependent fluid in the left maxillary sinus. Remaining sinuses are clear. Clear mastoid air cells and middle ear cavities. Soft tissues: Soft tissue swelling extends from the right periorbital region across the right cheek. CT CERVICAL SPINE FINDINGS Alignment: Straightened cervical lordosis.  No spondylolisthesis. Skull base and vertebrae: No acute fracture. No primary bone lesion or focal pathologic process. Soft tissues and spinal canal: No prevertebral fluid or swelling. No visible canal hematoma. Disc levels: Mild loss of disc height at C4-C5. Moderate loss of disc height at C5-C6 and C6-C7. Mild spondylotic disc bulging with endplate spurring at these levels. No convincing disc herniation. Bilateral facet degenerative changes, greater on the left. Upper chest: No acute findings. Partly calcified apical pleuroparenchymal scarring. Other: None. IMPRESSION: HEAD CT 1. No acute intracranial abnormalities.  No skull fracture. MAXILLOFACIAL CT 1.  No fractures. 2. Right preseptal periorbital soft tissue swelling/hemorrhage extending across the right cheek. No injury to the right globe or postseptal orbit. CERVICAL CT 1. No fracture or acute finding. Electronically Signed   By: Amie Portland M.D.   On: 06-03-2021 10:19   CT Cervical Spine Wo Contrast  Result Date: Jun 03, 2021 CLINICAL DATA:  Fall.  Periorbital swelling.  Neck pain. EXAM: CT HEAD WITHOUT CONTRAST CT MAXILLOFACIAL WITHOUT CONTRAST CT CERVICAL SPINE WITHOUT CONTRAST TECHNIQUE: Multidetector CT imaging of the head, cervical spine, and maxillofacial structures were performed using the standard protocol without intravenous contrast. Multiplanar CT image reconstructions of the cervical spine and maxillofacial structures were also generated. COMPARISON:  None. FINDINGS: CT HEAD FINDINGS Brain: No evidence of acute infarction, hemorrhage, hydrocephalus, extra-axial collection or mass lesion/mass effect. Patchy white matter hypoattenuation noted bilaterally consistent with mild chronic  microvascular ischemic change. Vascular: No hyperdense vessel or unexpected calcification. Skull: Normal. Negative for fracture or focal lesion. Other: None. CT MAXILLOFACIAL FINDINGS Osseous: No fracture or mandibular dislocation. No destructive process. Orbits: Right preseptal periorbital soft tissue swelling/hemorrhage. No injury to the right globe or postseptal orbit. Normal left globe and orbit. Sinuses: Minimal dependent fluid in the left maxillary sinus. Remaining sinuses are clear. Clear mastoid air cells and middle ear cavities. Soft tissues: Soft tissue swelling extends from the right periorbital region across the right cheek. CT CERVICAL SPINE FINDINGS Alignment: Straightened cervical lordosis.  No spondylolisthesis. Skull base and vertebrae: No acute fracture. No primary bone lesion or focal pathologic process. Soft tissues and spinal canal: No prevertebral fluid or swelling. No visible canal hematoma.  Disc levels: Mild loss of disc height at C4-C5. Moderate loss of disc height at C5-C6 and C6-C7. Mild spondylotic disc bulging with endplate spurring at these levels. No convincing disc herniation. Bilateral facet degenerative changes, greater on the left. Upper chest: No acute findings. Partly calcified apical pleuroparenchymal scarring. Other: None. IMPRESSION: HEAD CT 1. No acute intracranial abnormalities.  No skull fracture. MAXILLOFACIAL CT 1. No fractures. 2. Right preseptal periorbital soft tissue swelling/hemorrhage extending across the right cheek. No injury to the right globe or postseptal orbit. CERVICAL CT 1. No fracture or acute finding. Electronically Signed   By: Amie Portland M.D.   On: 05/07/2021 10:19   NM GI Blood Loss  Result Date: 05/09/2021 CLINICAL DATA:  Bright red blood per rectum also mixed with melena by report obtained from nursing staff in nuclear medicine during the examination. EXAM: NUCLEAR MEDICINE GASTROINTESTINAL BLEEDING SCAN TECHNIQUE: Sequential abdominal images were obtained following intravenous administration of Tc-43m labeled red blood cells. RADIOPHARMACEUTICALS:  20.92 mCi Tc-48m pertechnetate in-vitro labeled red cells. COMPARISON:  No recent comparison imaging is available. There is imaging from 2007. FINDINGS: Initial images show some activity in the LEFT lower quadrant. Subtle activity noted over the descending duodenum/midline early in the examination as well. Later in the examination more pronounced activity is demonstrated over the expected location of the descending duodenum and serpiginous appearance of numerous areas of activity over the LEFT hemiabdomen most suggestive of labelled blood products filling jejunal loops beyond suspected site of bleeding in the proximal and or descending duodenum. IMPRESSION: Signs of active bleeding favored to originate from the descending duodenum with blood products passing into the jejunum on more delayed images and  intermittently throughout the scan. Critical Value/emergent results were called by telephone at the time of interpretation on 05/06/2021 at 12:30 pm to provider Harlon Ditty, NP , who verbally acknowledged these results. Electronically Signed   By: Donzetta Kohut M.D.   On: 05/06/2021 12:31   DG Hand 2 View Right  Result Date: 04/26/2021 CLINICAL DATA:  Patient coming [facility] from home for unwitnessed fall. Patient reportedly fell once yesterday as well; EMS helped patient up, but patient refused transport to hospital for first fall. Patient reports down for second fall for 8 hours. Pain and bruising down right arm and hip. Pt unable to verbalize coherently at this point, unable to obtain history. EXAM: RIGHT HAND - 2 VIEW COMPARISON:  None. FINDINGS: No fracture or bone lesion. Arthropathic changes involving multiple joints, most prominently the DIP joint of the middle finger, pattern consistent with osteoarthritis. Joints are normally aligned. Soft tissues are unremarkable. IMPRESSION: 1. No fracture or dislocation. 2. Osteoarthritis. Electronically Signed   By: Amie Portland M.D.   On: 05/09/2021 10:53  DG Chest Port 1 View  Result Date: 05/15/2021 CLINICAL DATA:  Patient coming [facility] from home for unwitnessed fall. Patient reportedly fell once yesterday as well; EMS helped patient up, but patient refused transport to hospital for first fall. Patient reports down for second fall for 8 hours. Pain and bruising down right arm and hip. Pt unable to verbalize coherently at this point, unable to obtain history. EXAM: PORTABLE CHEST 1 VIEW COMPARISON:  03/21/2017 FINDINGS: Mild enlargement of the cardiopericardial silhouette. No mediastinal or hilar masses. Clear lungs.  No convincing pleural effusion or pneumothorax. Skeletal structures are grossly intact. IMPRESSION: No active disease. Electronically Signed   By: Amie Portland M.D.   On: 05/15/2021 10:50   DG Humerus Right  Result Date:  05/14/2021 CLINICAL DATA:  Patient coming [facility] from home for unwitnessed fall. Patient reportedly fell once yesterday as well; EMS helped patient up, but patient refused transport to hospital for first fall. Patient reports down for second fall for 8 hours. Pain and bruising down right arm and hip. Pt unable to verbalize coherently at this point, unable to obtain history. EXAM: RIGHT HUMERUS - 2+ VIEW COMPARISON:  None. FINDINGS: No fracture or bone lesion. Glenohumeral and elbow joints are normally aligned. Soft tissues are unremarkable. IMPRESSION: No fracture or dislocation. Electronically Signed   By: Amie Portland M.D.   On: 04/29/2021 10:51   DG HIP UNILAT WITH PELVIS 2-3 VIEWS RIGHT  Result Date: 05/02/2021 CLINICAL DATA:  Patient coming [facility] from home for unwitnessed fall. Patient reportedly fell once yesterday as well; EMS helped patient up, but patient refused transport to hospital for first fall. Patient reports down for second fall for 8 hours. Pain and bruising down right arm and hip. Pt unable to verbalize coherently at this point, unable to obtain history. EXAM: DG HIP (WITH OR WITHOUT PELVIS) 2-3V RIGHT COMPARISON:  None. FINDINGS: No fracture or bone lesion. Hip joints, SI joints and symphysis pubis are normally aligned. Soft tissues are unremarkable. IMPRESSION: 1. No fracture or dislocation. Electronically Signed   By: Amie Portland M.D.   On: 05/02/2021 10:50   CT Maxillofacial Wo Contrast  Result Date: 04/23/2021 CLINICAL DATA:  Fall.  Periorbital swelling.  Neck pain. EXAM: CT HEAD WITHOUT CONTRAST CT MAXILLOFACIAL WITHOUT CONTRAST CT CERVICAL SPINE WITHOUT CONTRAST TECHNIQUE: Multidetector CT imaging of the head, cervical spine, and maxillofacial structures were performed using the standard protocol without intravenous contrast. Multiplanar CT image reconstructions of the cervical spine and maxillofacial structures were also generated. COMPARISON:  None. FINDINGS: CT HEAD FINDINGS  Brain: No evidence of acute infarction, hemorrhage, hydrocephalus, extra-axial collection or mass lesion/mass effect. Patchy white matter hypoattenuation noted bilaterally consistent with mild chronic microvascular ischemic change. Vascular: No hyperdense vessel or unexpected calcification. Skull: Normal. Negative for fracture or focal lesion. Other: None. CT MAXILLOFACIAL FINDINGS Osseous: No fracture or mandibular dislocation. No destructive process. Orbits: Right preseptal periorbital soft tissue swelling/hemorrhage. No injury to the right globe or postseptal orbit. Normal left globe and orbit. Sinuses: Minimal dependent fluid in the left maxillary sinus. Remaining sinuses are clear. Clear mastoid air cells and middle ear cavities. Soft tissues: Soft tissue swelling extends from the right periorbital region across the right cheek. CT CERVICAL SPINE FINDINGS Alignment: Straightened cervical lordosis.  No spondylolisthesis. Skull base and vertebrae: No acute fracture. No primary bone lesion or focal pathologic process. Soft tissues and spinal canal: No prevertebral fluid or swelling. No visible canal hematoma. Disc levels: Mild loss of disc height at  C4-C5. Moderate loss of disc height at C5-C6 and C6-C7. Mild spondylotic disc bulging with endplate spurring at these levels. No convincing disc herniation. Bilateral facet degenerative changes, greater on the left. Upper chest: No acute findings. Partly calcified apical pleuroparenchymal scarring. Other: None. IMPRESSION: HEAD CT 1. No acute intracranial abnormalities.  No skull fracture. MAXILLOFACIAL CT 1. No fractures. 2. Right preseptal periorbital soft tissue swelling/hemorrhage extending across the right cheek. No injury to the right globe or postseptal orbit. CERVICAL CT 1. No fracture or acute finding. Electronically Signed   By: Amie Portland M.D.   On: 2021/05/27 10:19    Assessment/Plan 1.  Upper GI bleed: At the present time the patient has undergone  successful intervention.  We will continue to monitor her hemoglobin and continue with supportive care.  Given the location of the lesion should she bleed again then embolization of the gastroduodenal artery may help control the hemorrhage.  We also have general surgery on board should that direction need to be taken.  However, overall the patient is a relatively poor risk for a major surgery of that type.  We will continue to follow.  2.  Atrial fibrillation: Continue antiarrhythmia medications as already ordered, these medications have been reviewed and there are no changes at this time.  Given her GI bleed anticoagulation is contraindicated at this time.  3.  Hypertension: Given her recent bleed antihypertensive medications will be adjusted as needed.  Plan would be to return to her normal regime at discharge.  4.  Hyperlipidemia: Continue statin as ordered and reviewed, no changes at this time   Levora Dredge, MD  04/25/2021 7:14 PM

## 2021-05-12 NOTE — Progress Notes (Signed)
Spoke with Reinier, RN, he stated he started new 20 gauge PIV in right forearm for Levophed and applied IV watch. Patient now has 3 PIVs. He will notify us if additional access is needed.

## 2021-05-12 NOTE — Progress Notes (Addendum)
Patient noted with significant drop in hemoglobin from 8.2>7.1>6.2 since admission.  On bedside assessment, she is afebrile with blood pressure 113/64 mm Hg and pulse rate 98 beats/min, RR 26 breaths per minute, sats 96% on 2L. There were no focal neurological deficits; She was alert but oriented to self only. Abdomen non tender but slightly distended, lungs coarse with mild crackles bilaterally. She has moderate black tarry stool.  GI Bleed  Acute Upper/Lower without evidence hemodynamic instability No Hematemesis or Hematochezia. +Black tarry stools, takes NSAIDs on regular basis - At least 2x IV access, 18 gauge or larger - IVF resuscitation to maintain MAP>65 - H&H monitoring q6h - Blood Consent.  Transfuse PRN Hgb<7 - Pantoprazole 40mg  IV BID - NPO for pending endoscopy - Unable to obtain CT angio to isolate source due to impaired renal function. Will obtain bleeding scan - Obtain chest xray and KUB - GI Consult for EGD/Colonoscopy - Helicobacter pylori Ab + stool Ag.   - Hold NSAIDs, steroids, ASA     , DNP, CCRN, FNP-C, AGACNP-BC Acute Care Nurse Practitioner  [facility] Pager: [phone] [place] [facility]

## 2021-05-12 NOTE — Transfer of Care (Signed)
Immediate Anesthesia Transfer of Care Note  Patient: Angela Sullivan  Procedure(s) Performed: ESOPHAGOGASTRODUODENOSCOPY (EGD) WITH PROPOFOL  Patient Location: Endoscopy Unit  Anesthesia Type:General  Level of Consciousness: sedated  Airway & Oxygen Therapy: Patient Spontanous Breathing and Patient connected to face mask oxygen  Post-op Assessment: Report given to RN and Post -op Vital signs reviewed and stable  Post vital signs: Reviewed and stable  Last Vitals:  Vitals Value Taken Time  BP 117/52 04/29/2021 1410  Temp 36.2 C 05/10/2021 1410  Pulse 110 05/21/2021 1410  Resp 16 05/07/2021 1410  SpO2 99 % 05/02/2021 1410    Last Pain:  Vitals:   05/03/2021 1410  TempSrc: Temporal  PainSc: Asleep         Complications: No notable events documented.

## 2021-05-12 NOTE — TOC Initial Note (Signed)
Transition of Care [facility]) - Initial/Assessment Note    Patient Details  Name: Angela Sullivan MRN: [id number] Date of Birth: 18-Oct-1940  Transition of Care [facility]) CM/SW Contact:    Allayne Butcher, RN Phone Number: 05/16/21, 11:02 AM  Clinical Narrative:                 Patient admitted to the hospital with hypothermia and hyponatremia.  Her friend, Vikki Ports found the patient on the floor at home after falling and she had been lying there for an unknown amount of time.    Patient is currently in the ICU but down in Nuclear Medicine for GI bleeding scan, receiving blood transfusion.   RNCM was able to speak with patient's friend, Vikki Ports via phone.  Vikki Ports is the closest thing patient has to family, she is widowed and no living children.  Patient lives at home alone.   TOC will follow patient for needs as she may need SNF for rehab or [facility] services at discharge.  She has had [facility] in the past back in 2018.  Patient is current with her PCP Dr. Larwance Sachs and has not been using any DME at home- Vikki Ports did take her a cane and walker to have and use at home.    Expected Discharge Plan: Skilled Nursing Facility Barriers to Discharge: Continued Medical Work up   Patient Goals and CMS Choice   CMS Medicare.gov Compare Post Acute Care list provided to:: Patient Choice offered to / list presented to : Patient  Expected Discharge Plan and Services Expected Discharge Plan: Skilled Nursing Facility   Discharge Planning Services: CM Consult Post Acute Care Choice: Skilled Nursing Facility Living arrangements for the past 2 months: Single Family Home                 DME Arranged: N/A DME Agency: NA                  Prior Living Arrangements/Services Living arrangements for the past 2 months: Single Family Home Lives with:: Self Patient language and need for interpreter reviewed:: Yes        Need for Family Participation in Patient Care: Yes (Comment) Care giver support system  in place?: No (comment)   Criminal Activity/Legal Involvement Pertinent to Current Situation/Hospitalization: No - Comment as needed  Activities of Daily Living Home Assistive Devices/Equipment: None ADL Screening (condition at time of admission) Patient's cognitive ability adequate to safely complete daily activities?: Yes Is the patient deaf or have difficulty hearing?: Yes Does the patient have difficulty seeing, even when wearing glasses/contacts?: Yes Does the patient have difficulty concentrating, remembering, or making decisions?: Yes Patient able to express need for assistance with ADLs?: Yes Does the patient have difficulty dressing or bathing?: Yes Independently performs ADLs?: Yes (appropriate for developmental age) Does the patient have difficulty walking or climbing stairs?: Yes Weakness of Legs: Both Weakness of Arms/Hands: Both  Permission Sought/Granted Permission sought to share information with : Family Supports    Share Information with NAME: Raford Pitcher     Permission granted to share info w Relationship: friend  Permission granted to share info w Contact Information: [phone]  Emotional Assessment Appearance:: Appears stated age Attitude/Demeanor/Rapport: Lethargic Affect (typically observed): Unable to Assess Orientation: : Oriented to Self, Oriented to Place Alcohol / Substance Use: Not Applicable Psych Involvement: No (comment)  Admission diagnosis:  Hyponatremia [E87.1] Hypothermia [T68.XXXA] Pain [R52] Traumatic rhabdomyolysis, initial encounter (HCC) [T79.6XXA] Leukocytosis, unspecified type [D72.829] Patient Active Problem  List   Diagnosis Date Noted   Pressure injury of skin 05/07/2021   Hypothermia 05-23-21   High cholesterol    Hypertension    Fall at home, initial encounter    New onset atrial fibrillation (HCC)    Elevated troponin    Abnormal LFTs    Rhabdomyolysis    Hypokalemia    SIRS (systemic inflammatory response  syndrome) (HCC)    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 [place] [address] Phone: [phone] Fax: [phone]     Social Determinants of Health (SDOH) Interventions    Readmission Risk Interventions No flowsheet data found.

## 2021-05-12 NOTE — Consult Note (Addendum)
PHARMACY CONSULT NOTE - FOLLOW UP  Pharmacy Consult for Electrolyte Monitoring and Replacement   Recent Labs: Potassium (mmol/L)  Date Value  05/11/2021 3.3 (L)   Magnesium (mg/dL)  Date Value  73/53/2992 2.0   Calcium (mg/dL)  Date Value  42/68/3419 8.5 (L)   Albumin (g/dL)  Date Value  62/22/9798 3.4 (L)   Phosphorus (mg/dL)  Date Value  92/03/9416 3.4   Sodium (mmol/L)  Date Value  05/18/2021 126 (L)     Assessment: Patient is an 80 y/o F with medical history of anxiety, gout, HLD, HTN  who presented to the ED 12/18 with falls / prolonged down time. Patient found to be severely hyponatremic. Pharmacy was consulted for electrolyte monitoring and replacement.   Labs K 3.6>3.3 Mg 1.5>2.0  Goal of Therapy:  Electrolytes WNL  Plan:  --Will order 10 mEq Kcl IV q1h x4 doses   --No other replacement is currently indicated  --Will continue to monitor and replace electrolytes as clinically indicated   Doloris Hall, PharmD Pharmacy Resident  04/27/2021 11:04 AM

## 2021-05-12 NOTE — Consult Note (Signed)
[name], MD [address]  [place]  [place], [place] [address]  Main: [phone]  Fax: [phone] Pager: [phone]   Consultation  Referring Provider:     No ref. provider found Primary Care Physician:  Derinda Late, MD Primary Gastroenterologist: Althia Forts     Reason for Consultation:     GI bleed  Date of Admission:  05/23/2021 Date of Consultation:  05/15/2021         HPI:   Angela Sullivan is a 80 y.o. female with history of hypertension, is admitted with altered mental status secondary to severe hyponatremia, serum sodium 114 on 12/18, admitted to ICU, treated with hypertonic saline, with improvement in her mental status, serum sodium 126 today.  Patient was also found to have hemoglobin 8.2 with leukocytosis and MCV 100.9 on admission, (baseline 12.8 on 03/21/2017 ), BUN/creatinine 23/0.97, no evidence of chronic liver disease, has mild thrombocytopenia.  Labs from earlier this morning, patient was found to have drop in hemoglobin from 8.2 on admission to 6.2 in last 48 hours.  She is hemodynamically stable, not hypotensive.  She was found to have black tarry stool as well as dark maroon stool per rectum witnessed by her nurse.  Patient underwent nuclear medicine bleeding scan, reported as signs of active bleeding favored to originate from the descending duodenum with blood products passing into the jejunum  Patient received 1 unit of PRBCs and 2 units on hold.  Her hemoglobin responded appropriately, currently 7.  Serum sodium 127.  Patient denied any abdominal pain, nausea or vomiting.  She was able to answer my questions   NSAIDs: Meloxicam and indomethacin as outpatient  Antiplts/Anticoagulants/Anti thrombotics: None  GI Procedures: None  Past Medical History:  Diagnosis Date   Anxiety    Gout    High cholesterol    Hypertension     Past Surgical History:  Procedure Laterality Date   DILATION AND CURETTAGE OF UTERUS      Prior to  Admission medications   Medication Sig Start Date End Date Taking? Authorizing Provider  Cholecalciferol 1000 units tablet Take 5,000 Units by mouth daily.    [provider]  indomethacin (INDOCIN) 25 MG capsule Take 25 mg by mouth every 6 (six) hours as needed.    [provider]  lisinopril (PRINIVIL,ZESTRIL) 40 MG tablet Take 1 tablet (40 mg total) by mouth daily. 03/26/17 03/26/18  Henreitta Leber, MD  medroxyPROGESTERone (PROVERA) 10 MG tablet Take 1 tablet (10 mg total) by mouth daily. Patient not taking: Reported on 05/11/2021 07/29/20 10/22/21  Homero Fellers, MD  Melatonin 5 MG TABS Take 2 tablets by mouth at bedtime.    [provider]  meloxicam (MOBIC) 7.5 MG tablet Take 7.5 mg by mouth 2 (two) times daily. Patient not taking: Reported on 05/11/2021    [provider]  omega-3 acid ethyl esters (LOVAZA) 1 g capsule Take 1 g by mouth daily.    [provider]  pravastatin (PRAVACHOL) 20 MG tablet Take 20 mg by mouth at bedtime.    [provider]    Current Facility-Administered Medications:    0.9 %  sodium chloride infusion (Manually program via Guardrails IV Fluids), , Intravenous, Once, Lang Snow, NP, Held at 05/01/2021 0844   chlorhexidine (PERIDEX) 0.12 % solution 15 mL, 15 mL, Mouth Rinse, BID, Kasa, Kurian, MD, 15 mL at 05/15/2021 1234   Chlorhexidine Gluconate Cloth 2 % PADS 6 each, 6 each, Topical, Q0600,  Ottie Glazier, MD, 6 each at 05/23/2021 0900   feeding supplement (ENSURE ENLIVE / ENSURE PLUS) liquid 237 mL, 237 mL, Oral, TID BM, Lanney Gins, Fuad, MD, 237 mL at 05/11/21 1935   MEDLINE mouth rinse, 15 mL, Mouth Rinse, q12n4p, Kasa, Maretta Bees, MD, 15 mL at 05/05/2021 1234   multivitamin with minerals tablet 1 tablet, 1 tablet, Oral, Daily, Ottie Glazier, MD   Derrill Memo ON 05/15/2021] pantoprazole (PROTONIX) injection 40 mg, 40 mg, Intravenous, Q12H, Ouma, Bing Neighbors, NP   polyethylene glycol  (MIRALAX / GLYCOLAX) packet 17 g, 17 g, Oral, Daily PRN, Lanney Gins, Fuad, MD   potassium chloride 10 mEq in 100 mL IVPB, 10 mEq, Intravenous, Q1 Hr x 4, Deal, Justice Britain, RPH, Last Rate: 100 mL/hr at 05/15/2021 1326, 10 mEq at 04/27/2021 1326   sodium chloride (hypertonic) 3 % solution, , Intravenous, Continuous, Quale, Mark, MD, Last Rate: 30 mL/hr at 05/01/2021 1300, Infusion Verify at 05/02/2021 1300  Family History  Problem Relation Age of Onset   Heart disease Father    COPD Neg Hx    Diabetes Mellitus II Neg Hx      Social History   Tobacco Use   Smoking status: Never   Smokeless tobacco: Never  Vaping Use   Vaping Use: Never used  Substance Use Topics   Alcohol use: Yes    Comment: occasional   Drug use: No    Allergies as of 04/23/2021   (No Known Allergies)    Review of Systems:    All systems reviewed and negative except where noted in HPI.   Physical Exam:  Vital signs in last 24 hours: Temp:  [97.4 F (36.3 C)-98.6 F (37 C)] 98.6 F (37 C) (12/20 1315) Pulse Rate:  [48-115] 94 (12/20 1315) Resp:  [16-30] 23 (12/20 1315) BP: (91-144)/(41-77) 104/61 (12/20 1315) SpO2:  [91 %-100 %] 100 % (12/20 1315) FiO2 (%):  [28 %] 28 % (12/20 1100) Weight:  [88 kg] 88 kg (12/20 0500) Last BM Date: 05/15/2021 General:   Appearing, lethargic,, cooperative in NAD Head:  Normocephalic and atraumatic. Eyes:   No icterus.   Conjunctiva pale. PERRLA. Ears:  Normal auditory acuity. Neck:  Supple; no masses or thyroidomegaly Lungs: Respirations even and unlabored. Lungs clear to auscultation bilaterally.   No wheezes, crackles, or rhonchi.  Heart:  Regular rate and rhythm;  Without murmur, clicks, rubs or gallops Abdomen:  Soft, nondistended, nontender. Normal bowel sounds. No appreciable masses or hepatomegaly.  No rebound or guarding.  Rectal:  Not performed. Msk:  Symmetrical without gross deformities.  Strength generalized weakness Extremities:  Without edema, cyanosis  or clubbing. Neurologic:  Alert and oriented x3;  grossly normal neurologically. Skin:  Intact without significant lesions or rashes. Psych:  Alert and cooperative. Normal affect.  LAB RESULTS: CBC Latest Ref Rng & Units 05/16/2021 04/27/2021 05/11/2021  WBC 4.0 - 10.5 K/uL - 13.7(H) 17.0(H)  Hemoglobin 12.0 - 15.0 g/dL 7.0(L) 6.2(L) 7.1(L)  Hematocrit 36.0 - 46.0 % 19.8(L) 17.7(L) 19.2(L)  Platelets 150 - 400 K/uL - 150 143(L)    BMET BMP Latest Ref Rng & Units 04/26/2021 05/07/2021 05/04/2021  Glucose 70 - 99 mg/dL - - 123(H)  BUN 8 - 23 mg/dL - - 36(H)  Creatinine 0.44 - 1.00 mg/dL - - 1.31(H)  Sodium 135 - 145 mmol/L 127(L) 126(L) 124(L)  Potassium 3.5 - 5.1 mmol/L - - 3.3(L)  Chloride 98 - 111 mmol/L - - 97(L)  CO2 22 - 32 mmol/L - - 22  Calcium 8.9 - 10.3 mg/dL - - 8.5(L)    LFT Hepatic Function Latest Ref Rng & Units 05/22/2021 03/21/2017  Total Protein 6.5 - 8.1 g/dL 6.1(L) 7.4  Albumin 3.5 - 5.0 g/dL 3.4(L) 4.1  AST 15 - 41 U/L 117(H) 30  ALT 0 - 44 U/L 38 23  Alk Phosphatase 38 - 126 U/L 113 62  Total Bilirubin 0.3 - 1.2 mg/dL 3.3(H) 1.1  Bilirubin, Direct 0.1 - 0.5 mg/dL - 0.2     STUDIES: DG Chest 1 View  Result Date: 04/28/2021 CLINICAL DATA:  80 year old female with shortness of breath and abdominal distension. EXAM: CHEST  1 VIEW COMPARISON:  Portable chest 04/29/2021 and earlier. FINDINGS: Portable AP semi upright view at 0459 hours. More rotated to the right. Stable cardiomegaly and mediastinal contours. Stable lung volumes. Allowing for portable technique the lungs are clear. No pneumothorax or pleural effusion identified. No acute osseous abnormality identified. Negative visible bowel gas. IMPRESSION: Cardiomegaly.  No acute cardiopulmonary abnormality. Electronically Signed   By: Genevie Ann M.D.   On: 05/19/2021 05:34   DG Abd 1 View  Result Date: 05/14/2021 CLINICAL DATA:  80 year old female with shortness of breath and abdominal distension. EXAM:  ABDOMEN - 1 VIEW COMPARISON:  CT Abdomen and Pelvis 01/28/2006. FINDINGS: Portable AP supine view at 0502 hours. Moderately distended gas-filled stomach. Gas containing but nondilated large bowel in the abdomen and visible pelvis. A few nondilated gas-filled small bowel loops are visible in the right lower abdomen. No definite pneumoperitoneum on this supine view. Visible lung bases appear negative. No acute osseous abnormality identified. IMPRESSION: 1. Moderately gas distended stomach. Consider NG tube decompression. 2. But elsewhere the bowel-gas pattern is within normal limits. Electronically Signed   By: Genevie Ann M.D.   On: 04/24/2021 05:35   NM GI Blood Loss  Result Date: 05/20/2021 CLINICAL DATA:  Bright red blood per rectum also mixed with melena by report obtained from nursing staff in nuclear medicine during the examination. EXAM: NUCLEAR MEDICINE GASTROINTESTINAL BLEEDING SCAN TECHNIQUE: Sequential abdominal images were obtained following intravenous administration of Tc-65mlabeled red blood cells. RADIOPHARMACEUTICALS:  20.92 mCi Tc-984mertechnetate in-vitro labeled red cells. COMPARISON:  No recent comparison imaging is available. There is imaging from 2007. FINDINGS: Initial images show some activity in the LEFT lower quadrant. Subtle activity noted over the descending duodenum/midline early in the examination as well. Later in the examination more pronounced activity is demonstrated over the expected location of the descending duodenum and serpiginous appearance of numerous areas of activity over the LEFT hemiabdomen most suggestive of labelled blood products filling jejunal loops beyond suspected site of bleeding in the proximal and or descending duodenum. IMPRESSION: Signs of active bleeding favored to originate from the descending duodenum with blood products passing into the jejunum on more delayed images and intermittently throughout the scan. Critical Value/emergent results were called by  telephone at the time of interpretation on 04/26/2021 at 12:30 pm to provider JeDarel HongNP , who verbally acknowledged these results. Electronically Signed   By: GeZetta Bills.D.   On: 05/01/2021 12:31      Impression / Plan:   PoMariama Saintvils a [age].o. female with history of hypertension is admitted with altered mental status secondary to severe hyponatremia and severe macrocytic anemia, melena and hematochezia  Acute blood loss anemia with melena and hematochezia Mildly elevated BUN/creatinine, history of NSAID use Positive nuclear medicine bleeding scan concerning for active bleed from descending duodenum Patient's hemoglobin at  baseline of 12.8 in 2018, 8 on admission, dropped to 6.2 in 48 hours, responded appropriately to 1 unit of PRBCs. Patient is currently hemodynamically stable Continue Protonix 40 mg IV twice daily Maintain 2 large-bore IVs Closely monitor hemoglobin to maintain above 7 Strict n.p.o. After discussing with vascular surgeon, Dr. Delana Meyer, who was planning to do embolization based on the positive bleeding scan, given concern for upper GI source, will proceed with emergent upper endoscopy to localize and possibly treat the source of bleeding  I talked to her friend, Ms Eugenie Filler over phone who found her down and brought her to the hospital.  Apparently, patient does not have any healthcare power of attorney and not sure if she has any family members.  We have not been able to trace her family members  Thank you for involving me in the care of this patient.      LOS: 2 days   Sherri Sear, MD  05/08/2021, 1:30 PM    Note: This dictation was prepared with Dragon dictation along with smaller phrase technology. Any transcriptional errors that result from this process are unintentional.

## 2021-05-12 NOTE — Progress Notes (Signed)
Date and time results received: 05-21-2021 0400 (use smartphrase ".now" to insert current time)  Test: Hemoglobin Critical Value: 6.2  Name of Provider Notified: Webb Silversmith NP  Orders Received? Or Actions Taken?: Actions Taken: Awaiting orders

## 2021-05-12 NOTE — Progress Notes (Signed)
Patient consent signed emergent due to patient confusion and  no family to sign. Two doctors signed the consent.

## 2021-05-12 NOTE — Op Note (Signed)
[facility] Gastroenterology Patient Name: Angela Sullivan Procedure Date: 04/25/2021 1:39 PM MRN: [id number] Account #: [id number] Date of Birth: 1940-07-16 Admit Type: Inpatient Age: 80 Room: [facility] ENDO ROOM 4 Gender: Female Note Status: Finalized Instrument Name: Upper Endoscope [id number] Procedure:             Upper GI endoscopy Indications:           Acute post hemorrhagic anemia, , Hematochezia, Melena,                         positive bleeding scan Providers:             Toney Reil MD, MD Referring MD:          Hassell Halim MD (Referring MD) Medicines:             General Anesthesia Complications:         No immediate complications. Estimated blood loss: None. Procedure:             Pre-Anesthesia Assessment:                        - Prior to the procedure, a History and Physical was                         performed, and patient medications and allergies were                         reviewed. The patient is competent. The risks and                         benefits of the procedure and the sedation options and                         risks were discussed with the patient. All questions                         were answered and informed consent was obtained.                         Patient identification and proposed procedure were                         verified by the physician, the nurse, the                         anesthesiologist, the anesthetist and the technician                         in the pre-procedure area in the procedure room in the                         endoscopy suite. Mental Status Examination: lethargic.                         Airway Examination: normal oropharyngeal airway and                         neck mobility. Respiratory Examination: clear to  auscultation. CV Examination: normal. Prophylactic                         Antibiotics: The patient does not require prophylactic                          antibiotics. Prior Anticoagulants: The patient has                         taken no previous anticoagulant or antiplatelet                         agents. ASA Grade Assessment: IV - A patient with                         severe systemic disease that is a constant threat to                         life. After reviewing the risks and benefits, the                         patient was deemed in satisfactory condition to                         undergo the procedure. The anesthesia plan was to use                         general anesthesia. Immediately prior to                         administration of medications, the patient was                         re-assessed for adequacy to receive sedatives. The                         heart rate, respiratory rate, oxygen saturations,                         blood pressure, adequacy of pulmonary ventilation, and                         response to care were monitored throughout the                         procedure. The physical status of the patient was                         re-assessed after the procedure.                        After obtaining informed consent, the endoscope was                         passed under direct vision. Throughout the procedure,                         the patient's blood pressure, pulse, and oxygen  saturations were monitored continuously. The                         Endosonoscope was introduced through the mouth, and                         advanced to the second part of duodenum. The upper GI                         endoscopy was accomplished without difficulty. The                         patient tolerated the procedure well. Findings:      One non-obstructing non-bleeding superficial duodenal ulcer with a       nonbleeding pulsatile visible vessel with clot (Forrest Class IIa) was       found in the apex of the anterior duodenal bulb. The lesion was 5 mm in       largest dimension. There is no  evidence of perforation. After clearing       the area with minimal lavage, Area was successfully injected with 4 mL       of a 1:10,000 solution of epinephrine for hemostasis. For hemostasis,       two hemostatic clips were successfully placed (MR conditional). There       was no bleeding during, or at the end, of the procedure.      Many non-bleeding superficial duodenal ulcers with a clean ulcer base       covered by black eschar (Forrest Class III) were found in the duodenal       bulb and in the second portion of the duodenum concerning for ischemia.       The largest lesion was 10 mm in largest dimension.      The entire examined stomach was normal.      The cardia and gastric fundus were normal on retroflexion.      The gastroesophageal junction and examined esophagus were normal. Impression:            - Non-obstructing non-bleeding duodenal ulcer with a                         nonbleeding visible vessel (Forrest Class IIa).                         Suspicious for NSAID induced etiology. There is no                         evidence of perforation. Injected. Clips (MR                         conditional) were placed.                        - Non-bleeding duodenal ulcers with a clean ulcer base                         (Forrest Class III).                        - Normal stomach.                        -  Normal gastroesophageal junction and esophagus.                        - No specimens collected. Recommendation:        - Return patient to ICU for ongoing care.                        - NPO today.                        - Continue present medications.                        - Give Protonix (pantoprazole): initiate therapy with                         80 mg IV bolus, then 8 mg/hr IV by continuous infusion                         for 3 days. 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 All rights reserved. The codes documented in this report are preliminary and upon coder review may  be revised to meet current compliance requirements. Dr. Libby Maw Toney Reil MD, MD 05/06/2021 2:16:15 PM This report has been signed electronically. Number of Addenda: 0 Note Initiated On: 05/20/2021 1:39 PM Estimated Blood Loss:  Estimated blood loss: none.      [facility]

## 2021-05-13 ENCOUNTER — Encounter: Payer: Self-pay | Admitting: Gastroenterology

## 2021-05-13 DIAGNOSIS — K264 Chronic or unspecified duodenal ulcer with hemorrhage: Secondary | ICD-10-CM | POA: Diagnosis not present

## 2021-05-13 DIAGNOSIS — K269 Duodenal ulcer, unspecified as acute or chronic, without hemorrhage or perforation: Secondary | ICD-10-CM | POA: Diagnosis not present

## 2021-05-13 DIAGNOSIS — Z515 Encounter for palliative care: Secondary | ICD-10-CM

## 2021-05-13 DIAGNOSIS — Z7189 Other specified counseling: Secondary | ICD-10-CM

## 2021-05-13 DIAGNOSIS — T68XXXA Hypothermia, initial encounter: Secondary | ICD-10-CM

## 2021-05-13 DIAGNOSIS — T796XXA Traumatic ischemia of muscle, initial encounter: Secondary | ICD-10-CM

## 2021-05-13 DIAGNOSIS — W19XXXA Unspecified fall, initial encounter: Secondary | ICD-10-CM | POA: Diagnosis not present

## 2021-05-13 DIAGNOSIS — E871 Hypo-osmolality and hyponatremia: Secondary | ICD-10-CM | POA: Diagnosis not present

## 2021-05-13 DIAGNOSIS — K922 Gastrointestinal hemorrhage, unspecified: Secondary | ICD-10-CM | POA: Diagnosis not present

## 2021-05-13 DIAGNOSIS — D62 Acute posthemorrhagic anemia: Secondary | ICD-10-CM | POA: Diagnosis not present

## 2021-05-13 LAB — BASIC METABOLIC PANEL
Anion gap: 4 — ABNORMAL LOW (ref 5–15)
BUN: 42 mg/dL — ABNORMAL HIGH (ref 8–23)
CO2: 20 mmol/L — ABNORMAL LOW (ref 22–32)
Calcium: 8.3 mg/dL — ABNORMAL LOW (ref 8.9–10.3)
Chloride: 105 mmol/L (ref 98–111)
Creatinine, Ser: 1 mg/dL (ref 0.44–1.00)
GFR, Estimated: 57 mL/min — ABNORMAL LOW (ref >60)
Glucose, Bld: 109 mg/dL — ABNORMAL HIGH (ref 70–99)
Potassium: 3.7 mmol/L (ref 3.5–5.1)
Sodium: 129 mmol/L — ABNORMAL LOW (ref 135–145)

## 2021-05-13 LAB — CBC
HCT: 18.4 % — ABNORMAL LOW (ref 36.0–46.0)
Hemoglobin: 6.8 g/dL — ABNORMAL LOW (ref 12.0–15.0)
MCH: 34.2 pg — ABNORMAL HIGH (ref 26.0–34.0)
MCHC: 37 g/dL — ABNORMAL HIGH (ref 30.0–36.0)
MCV: 92.5 fL (ref 80.0–100.0)
Platelets: 133 10*3/uL — ABNORMAL LOW (ref 150–400)
RBC: 1.99 MIL/uL — ABNORMAL LOW (ref 3.87–5.11)
RDW: 19.6 % — ABNORMAL HIGH (ref 11.5–15.5)
WBC: 17.9 10*3/uL — ABNORMAL HIGH (ref 4.0–10.5)
nRBC: 1.8 % — ABNORMAL HIGH (ref 0.0–0.2)

## 2021-05-13 LAB — MAGNESIUM: Magnesium: 1.7 mg/dL (ref 1.7–2.4)

## 2021-05-13 LAB — SODIUM
Sodium: 129 mmol/L — ABNORMAL LOW (ref 135–145)
Sodium: 129 mmol/L — ABNORMAL LOW (ref 135–145)
Sodium: 129 mmol/L — ABNORMAL LOW (ref 135–145)

## 2021-05-13 LAB — HEMOGLOBIN AND HEMATOCRIT, BLOOD
HCT: 24.7 % — ABNORMAL LOW (ref 36.0–46.0)
Hemoglobin: 9 g/dL — ABNORMAL LOW (ref 12.0–15.0)

## 2021-05-13 LAB — PROTIME-INR
INR: 1.1 (ref 0.8–1.2)
Prothrombin Time: 14.1 seconds (ref 11.4–15.2)

## 2021-05-13 LAB — PHOSPHORUS: Phosphorus: 2.4 mg/dL — ABNORMAL LOW (ref 2.5–4.6)

## 2021-05-13 MED ORDER — SODIUM CHLORIDE 0.9 % IV SOLN: INTRAVENOUS | Status: DC

## 2021-05-13 MED ORDER — SODIUM CHLORIDE 0.9% IV SOLUTION: Freq: Once | INTRAVENOUS | Status: DC

## 2021-05-13 MED ORDER — FOLIC ACID 1 MG PO TABS
1.0000 mg | ORAL_TABLET | Freq: Every day | ORAL | Status: DC
  Administered 2021-05-13 – 2021-05-20 (×7): 1 mg via ORAL
  Filled 2021-05-13 (×7): qty 1

## 2021-05-13 NOTE — Progress Notes (Signed)
Angela Darby, MD [address]  [place]  [place], [place] [address]  Main: [phone]  Fax: [phone] Pager: [phone]   Subjective: No acute events overnight.  She did not have any witnessed episodes of rectal bleeding or black stools.  Patient's nurse reported that her left labia was swollen and irritated, was bleeding fresh blood.  Her hemoglobin dropped from 8 to 6.8 as well as worsening leukocytosis.  Patient is awake and alert, smiling, asking if she can have something to drink   Objective: Vital signs in last 24 hours: Vitals:   05/13/21 0600 05/13/21 0800 05/13/21 0845 05/13/21 0900  BP: (!) 119/58 (!) 128/58  (!) 121/52  Pulse: 97 94 92 94  Resp: 19 (!) 21 17 17   Temp:  98.5 F (36.9 C)  98 F (36.7 C)  TempSrc:  Axillary  Axillary  SpO2: 96% 96% 99% 98%  Weight:      Height:       Weight change: -0.5 kg  Intake/Output Summary (Last 24 hours) at 05/13/2021 0949 Last data filed at 05/13/2021 0830 Gross per 24 hour  Intake 2227.12 ml  Output 1125 ml  Net 1102.12 ml     Exam: Heart:: Regular rate and rhythm, S1S2 present, or without murmur or extra heart sounds Lungs: normal and clear to auscultation Abdomen: soft, nontender, normal bowel sounds   Lab Results: CBC Latest Ref Rng & Units 05/13/2021 05/19/2021 05/04/2021  WBC 4.0 - 10.5 K/uL 17.9(H) - -  Hemoglobin 12.0 - 15.0 g/dL 6.8(L) 8.0(L) 7.0(L)  Hematocrit 36.0 - 46.0 % 18.4(L) 22.0(L) 19.8(L)  Platelets 150 - 400 K/uL 133(L) - -   CMP Latest Ref Rng & Units 05/13/2021 05/13/2021 05/23/2021  Glucose 70 - 99 mg/dL - 109(H) -  BUN 8 - 23 mg/dL - 42(H) -  Creatinine 0.44 - 1.00 mg/dL - 1.00 -  Sodium 135 - 145 mmol/L 129(L) 129(L) 127(L)  Potassium 3.5 - 5.1 mmol/L - 3.7 -  Chloride 98 - 111 mmol/L - 105 -  CO2 22 - 32 mmol/L - 20(L) -  Calcium 8.9 - 10.3 mg/dL - 8.3(L) -  Total Protein 6.5 - 8.1 g/dL - - -  Total Bilirubin 0.3 - 1.2 mg/dL - - -  Alkaline Phos 38 - 126 U/L -  - -  AST 15 - 41 U/L - - -  ALT 0 - 44 U/L - - -   Iron/TIBC/Ferritin/ %Sat    Component Value Date/Time   IRON 26 (L) 05/11/2021 0630   TIBC 235 (L) 04/30/2021 0630   FERRITIN 252 04/24/2021 0630   IRONPCTSAT 11 05/09/2021 0630    Micro Results: Recent Results (from the past 240 hour(s))  Urine Culture     Status: None   Collection Time: 04/24/2021  9:26 AM   Specimen: Urine, Random  Result Value Ref Range Status   Specimen Description   Final    URINE, RANDOM Performed at [facility], [address] [place], [place] [address]    Special Requests   Final    NONE Performed at [facility], [address] [place], [place] [address]    Culture   Final    NO GROWTH Performed at [facility], [place] [address], [place], [place] [address]    Report Status 05/11/2021 FINAL  Final  Resp Panel by RT-PCR (Flu A&B, Covid)     Status: None   Collection Time: 05/01/2021  9:33 AM   Specimen:  Nasopharyngeal(NP) swabs in vial transport medium  Result Value Ref Range Status   SARS Coronavirus 2 by RT PCR NEGATIVE NEGATIVE Final    Comment: (NOTE) SARS-CoV-2 target nucleic acids are NOT DETECTED.  The SARS-CoV-2 RNA is generally detectable in upper respiratory specimens during the acute phase of infection. The lowest concentration of SARS-CoV-2 viral copies this assay can detect is 138 copies/mL. A negative result does not preclude SARS-Cov-2 infection and should not be used as the sole basis for treatment or other patient management decisions. A negative result may occur with  improper specimen collection/handling, submission of specimen other than nasopharyngeal swab, presence of viral mutation(s) within the areas targeted by this assay, and inadequate number of viral copies(<138 copies/mL). A negative result must be combined with clinical observations, patient history, and epidemiological information. The expected result is Negative.  Fact Sheet for  Patients:  BloggerCourse.com  Fact Sheet for Healthcare Providers:  SeriousBroker.it  This test is no t yet approved or cleared by the [place] FDA and  has been authorized for detection and/or diagnosis of SARS-CoV-2 by FDA under an Emergency Use Authorization (EUA). This EUA will remain  in effect (meaning this test can be used) for the duration of the COVID-19 declaration under Section 564(b)(1) of the Act, 21 U.S.C.section 360bbb-3(b)(1), unless the authorization is terminated  or revoked sooner.       Influenza A by PCR NEGATIVE NEGATIVE Final   Influenza B by PCR NEGATIVE NEGATIVE Final    Comment: (NOTE) The Xpert Xpress SARS-CoV-2/FLU/RSV plus assay is intended as an aid in the diagnosis of influenza from Nasopharyngeal swab specimens and should not be used as a sole basis for treatment. Nasal washings and aspirates are unacceptable for Xpert Xpress SARS-CoV-2/FLU/RSV testing.  Fact Sheet for Patients: BloggerCourse.com  Fact Sheet for Healthcare Providers: SeriousBroker.it  This test is not yet approved or cleared by the [place] FDA and has been authorized for detection and/or diagnosis of SARS-CoV-2 by FDA under an Emergency Use Authorization (EUA). This EUA will remain in effect (meaning this test can be used) for the duration of the COVID-19 declaration under Section 564(b)(1) of the Act, 21 U.S.C. section 360bbb-3(b)(1), unless the authorization is terminated or revoked.  Performed at [facility], [address] [place], [place] [address]   Blood Culture (routine x 2)     Status: None (Preliminary result)   Collection Time: 05/11/2021  9:34 AM   Specimen: BLOOD  Result Value Ref Range Status   Specimen Description BLOOD RIGHT ANTECUBITAL  Final   Special Requests   Final    BOTTLES DRAWN AEROBIC AND ANAEROBIC Blood Culture results may not  be optimal due to an excessive volume of blood received in culture bottles   Culture   Final    NO GROWTH 2 DAYS Performed at [facility], [address] [place], [place] [address]    Report Status PENDING  Incomplete  MRSA Next Gen by PCR, Nasal     Status: None   Collection Time: 05/05/2021  6:16 PM   Specimen: Nasal Mucosa; Nasal Swab  Result Value Ref Range Status   MRSA by PCR Next Gen NOT DETECTED NOT DETECTED Final    Comment: (NOTE) The GeneXpert MRSA Assay (FDA approved for NASAL specimens only), is one component of a comprehensive MRSA colonization surveillance program. It is not intended to diagnose MRSA infection nor to guide or monitor treatment for MRSA infections. Test performance is not FDA approved in patients  less than 36 years old. Performed at [facility], [address] [place], [place] [address]   Blood Culture (routine x 2)     Status: None (Preliminary result)   Collection Time: 05/01/2021  6:50 PM   Specimen: BLOOD  Result Value Ref Range Status   Specimen Description BLOOD BLOOD RIGHT HAND  Final   Special Requests   Final    BOTTLES DRAWN AEROBIC ONLY Blood Culture results may not be optimal due to an inadequate volume of blood received in culture bottles   Culture   Final    NO GROWTH 2 DAYS Performed at [facility], [address], [place],  [address]    Report Status PENDING  Incomplete   Studies/Results: DG Chest 1 View  Result Date: 05/01/2021 CLINICAL DATA:  80 year old female with shortness of breath and abdominal distension. EXAM: CHEST  1 VIEW COMPARISON:  Portable chest 05/20/2021 and earlier. FINDINGS: Portable AP semi upright view at 0459 hours. More rotated to the right. Stable cardiomegaly and mediastinal contours. Stable lung volumes. Allowing for portable technique the lungs are clear. No pneumothorax or pleural effusion identified. No acute osseous abnormality identified. Negative visible bowel gas.  IMPRESSION: Cardiomegaly.  No acute cardiopulmonary abnormality. Electronically Signed   By: Genevie Ann M.D.   On: 05/13/2021 05:34   DG Abd 1 View  Result Date: 05/16/2021 CLINICAL DATA:  80 year old female with shortness of breath and abdominal distension. EXAM: ABDOMEN - 1 VIEW COMPARISON:  CT Abdomen and Pelvis 01/28/2006. FINDINGS: Portable AP supine view at 0502 hours. Moderately distended gas-filled stomach. Gas containing but nondilated large bowel in the abdomen and visible pelvis. A few nondilated gas-filled small bowel loops are visible in the right lower abdomen. No definite pneumoperitoneum on this supine view. Visible lung bases appear negative. No acute osseous abnormality identified. IMPRESSION: 1. Moderately gas distended stomach. Consider NG tube decompression. 2. But elsewhere the bowel-gas pattern is within normal limits. Electronically Signed   By: Genevie Ann M.D.   On: 05/07/2021 05:35   NM GI Blood Loss  Result Date: 04/24/2021 CLINICAL DATA:  Bright red blood per rectum also mixed with melena by report obtained from nursing staff in nuclear medicine during the examination. EXAM: NUCLEAR MEDICINE GASTROINTESTINAL BLEEDING SCAN TECHNIQUE: Sequential abdominal images were obtained following intravenous administration of Tc-69m labeled red blood cells. RADIOPHARMACEUTICALS:  20.92 mCi Tc-42m pertechnetate in-vitro labeled red cells. COMPARISON:  No recent comparison imaging is available. There is imaging from 2007. FINDINGS: Initial images show some activity in the LEFT lower quadrant. Subtle activity noted over the descending duodenum/midline early in the examination as well. Later in the examination more pronounced activity is demonstrated over the expected location of the descending duodenum and serpiginous appearance of numerous areas of activity over the LEFT hemiabdomen most suggestive of labelled blood products filling jejunal loops beyond suspected site of bleeding in the proximal and  or descending duodenum. IMPRESSION: Signs of active bleeding favored to originate from the descending duodenum with blood products passing into the jejunum on more delayed images and intermittently throughout the scan. Critical Value/emergent results were called by telephone at the time of interpretation on 05/15/2021 at 12:30 pm to provider Darel Hong, NP , who verbally acknowledged these results. Electronically Signed   By: Zetta Bills M.D.   On: 05/17/2021 12:31   Medications: I have reviewed the patient's current medications. Prior to Admission:  Medications Prior to Admission  Medication Sig Dispense Refill Last Dose  Cholecalciferol 1000 units tablet Take 5,000 Units by mouth daily.      indomethacin (INDOCIN) 25 MG capsule Take 25 mg by mouth every 6 (six) hours as needed.      lisinopril (PRINIVIL,ZESTRIL) 40 MG tablet Take 1 tablet (40 mg total) by mouth daily. 30 tablet 1    medroxyPROGESTERone (PROVERA) 10 MG tablet Take 1 tablet (10 mg total) by mouth daily. (Patient not taking: Reported on 05/11/2021) 90 tablet 4 Completed Course   Melatonin 5 MG TABS Take 2 tablets by mouth at bedtime.      meloxicam (MOBIC) 7.5 MG tablet Take 7.5 mg by mouth 2 (two) times daily. (Patient not taking: Reported on 05/11/2021)   Completed Course   omega-3 acid ethyl esters (LOVAZA) 1 g capsule Take 1 g by mouth daily.      pravastatin (PRAVACHOL) 20 MG tablet Take 20 mg by mouth at bedtime.      Scheduled:  sodium chloride   Intravenous Once   chlorhexidine  15 mL Mouth Rinse BID   Chlorhexidine Gluconate Cloth  6 each Topical Q0600   feeding supplement  237 mL Oral TID BM   folic acid  1 mg Oral Daily   mouth rinse  15 mL Mouth Rinse q12n4p   multivitamin with minerals  1 tablet Oral Daily   [START ON 05/16/2021] pantoprazole  40 mg Intravenous Q12H   Continuous:  sodium chloride Stopped (05/13/21 0323)   sodium chloride     norepinephrine (LEVOPHED) Adult infusion Stopped (04/29/2021  2310)   pantoprazole 8 mg/hr (05/13/21 0600)   [id number] glycol Anti-infectives (From admission, onward)    None      Scheduled Meds:  sodium chloride   Intravenous Once   chlorhexidine  15 mL Mouth Rinse BID   Chlorhexidine Gluconate Cloth  6 each Topical Q0600   feeding supplement  237 mL Oral TID BM   folic acid  1 mg Oral Daily   mouth rinse  15 mL Mouth Rinse q12n4p   multivitamin with minerals  1 tablet Oral Daily   [START ON 05/16/2021] pantoprazole  40 mg Intravenous Q12H   Continuous Infusions:  sodium chloride Stopped (05/13/21 0323)   sodium chloride     norepinephrine (LEVOPHED) Adult infusion Stopped (05/17/2021 2310)   pantoprazole 8 mg/hr (05/13/21 0600)   PRN Meds:.polyethylene glycol   Assessment: Principal Problem:   Hypothermia Active Problems:   Hyponatremia   High cholesterol   Hypertension   Fall at home, initial encounter   New onset atrial fibrillation (HCC)   Elevated troponin   Abnormal LFTs   Rhabdomyolysis   Hypokalemia   SIRS (systemic inflammatory response syndrome) (HCC)   Pressure injury of skin  Angela Sullivan is a 80 y.o. 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  There were several clean-based duodenal ulcers in second portion of duodenum concerning for ischemia or secondary to NSAID use Patient's hemoglobin dropped from 8 to 6.8 today, currently receiving 1 unit of PRBCs.  Since patient is not actively bleeding at this time, will hold off on any further intervention.  If her hemoglobin continues to  drop  and she demonstrates any signs of further bleeding, next step would be embolization Patient is currently hemodynamically stable Continue Protonix drip for 72 hours and then switch to 40 mg twice daily Maintain 2 large-bore IVs Closely monitor hemoglobin to maintain above 7 Okay with clear liquid diet today Agree with palliative care consult Has severe folic acid deficiency, recommend to start oral folate 2 mg today then 1 mg daily     LOS: 3 days   Angela Sullivan 05/13/2021, 9:49 AM

## 2021-05-13 NOTE — Anesthesia Postprocedure Evaluation (Signed)
Anesthesia Post Note  Patient: Renlee Floor  Procedure(s) Performed: ESOPHAGOGASTRODUODENOSCOPY (EGD) WITH PROPOFOL  Patient location during evaluation: PACU Anesthesia Type: General Level of consciousness: lethargic Pain management: satisfactory to patient Vital Signs Assessment: post-procedure vital signs reviewed and stable Respiratory status: spontaneous breathing and patient connected to face mask oxygen Cardiovascular status: blood pressure returned to baseline Anesthetic complications: no   No notable events documented.   Last Vitals:  Vitals:   05/13/21 0500 05/13/21 0600  BP: (!) 131/56 (!) 119/58  Pulse: 97 97  Resp: 17 19  Temp:    SpO2: 98% 96%    Last Pain:  Vitals:   05/13/21 0400  TempSrc: Axillary  PainSc: 0-No pain                 VAN STAVEREN,Garnie Borchardt

## 2021-05-13 NOTE — Progress Notes (Addendum)
[facility] SURGICAL PROGRESS NOTE (cpt [phone])  Hospital Day(s): 3.   Interval History: Patient seen and examined, no acute events or new complaints overnight. Patient appears more alert but she is still a poor and unreliable historian. Hgb this morning to 6.8 (7.0 --> 8.0 --> 6.8). Plan to transfuse another unit pRBCs today. Renal function remains normal; scr - 1.00; UO - 1.2L. Hyponatremia is improved to 129. Discussed with RN this morning at bedside, no further bowel movements overnight, no blood per rectum either reported.   Review of Systems:  Unable to preform reliably secondary to mental acuity/poor historian  Vital signs in last 24 hours: [min-max] current  Temp:  [97.2 F (36.2 C)-98.6 F (37 C)] 98 F (36.7 C) (12/21 0400) Pulse Rate:  [60-110] 97 (12/21 0600) Resp:  [12-30] 19 (12/21 0600) BP: (62-131)/(36-65) 119/58 (12/21 0600) SpO2:  [96 %-100 %] 96 % (12/21 0600) FiO2 (%):  [28 %] 28 % (12/20 1700) Weight:  [87.5 kg] 87.5 kg (12/21 0500)     Height: 5\' 2"  (157.5 cm) Weight: 87.5 kg BMI (Calculated): 35.27   Intake/Output last 2 shifts:  12/20 0701 - 12/21 0700 In: 2315.8 [I.V.:652.4; Blood:578.8; IV Piggyback:1084.7] Out: 1200 [Urine:1200]   Physical Exam:  Constitutional: alert, cooperative and no distress  HENT: normocephalic without obvious abnormality  Respiratory: breathing non-labored at rest Cardiovascular: regular rate and sinus rhythm  Gastrointestinal: Abdomen is obese, soft, she does not appear tender, non-distended, no rebound/guarding, she does not appear peritonitic. She does have a previous open cholecystectomy scar Genitourinary: Foley in place Musculoskeletal: RUE is edematous and weeping, ecchymotic near IV, DP pulses 2+   Labs:  CBC Latest Ref Rng & Units 05/13/2021 04/29/2021 04/25/2021  WBC 4.0 - 10.5 K/uL 17.9(H) - -  Hemoglobin 12.0 - 15.0 g/dL 6.8(L) 8.0(L) 7.0(L)  Hematocrit 36.0 - 46.0 % 18.4(L) 22.0(L) 19.8(L)   Platelets 150 - 400 K/uL 133(L) - -   CMP Latest Ref Rng & Units 05/13/2021 05/13/2021 04/23/2021  Glucose 70 - 99 mg/dL - 109(H) -  BUN 8 - 23 mg/dL - 42(H) -  Creatinine 0.44 - 1.00 mg/dL - 1.00 -  Sodium 135 - 145 mmol/L 129(L) 129(L) 127(L)  Potassium 3.5 - 5.1 mmol/L - 3.7 -  Chloride 98 - 111 mmol/L - 105 -  CO2 22 - 32 mmol/L - 20(L) -  Calcium 8.9 - 10.3 mg/dL - 8.3(L) -  Total Protein 6.5 - 8.1 g/dL - - -  Total Bilirubin 0.3 - 1.2 mg/dL - - -  Alkaline Phos 38 - 126 U/L - - -  AST 15 - 41 U/L - - -  ALT 0 - 44 U/L - - -     Imaging studies: No new pertinent imaging studies   Assessment/Plan: (ICD-10's: K92.2) 80 y.o. female presenting after being found down with hypothermia, hyponatremia to 114 and admitted to ICU who subsequently was found to have BRBPR concerning for GI bleed, duodenal source, s/p EGD with clipping and epinephrine injection for hemostasis on 12/20   - Appreciate GI assistance; EGD reviewed - Vascular surgery on board as well - We will continue to follow closely for re-bleeding or failure to resolve this with more minimally invasive procedures. This morning, she is hemodynamically stable, no overt evidence of continued bleeding. If we fail to get source control but have localization, then we will need to consider potential surgical interventions. She is certainly not an optimal candidate in this manor given her other comorbidities and  very poor functional status.   - Agree with PPI; initiated yesterday (12/20) - Monitor abdominal examination; on-going bowel function - Monitor H&H; transfuse <7  - Pain control prn; antiemetics - Further management per primary service; we will be following closely  All of the above findings and recommendations were discussed with the medical team and patient's bedside RN.   -- Lynden Oxford, PA-C [facility] 05/13/2021, 7:34 AM [phone] M-F: 7am - 4pm

## 2021-05-13 NOTE — Consult Note (Signed)
Consultation Note Date: 05/13/2021   Patient Name: Angela Sullivan  DOB: [birth year]  MRN: [id number]  Age / Sex: 80 y.o., female  PCP: Kandyce Rud, MD Referring Physician: Erin Fulling, MD  Reason for Consultation: Establishing goals of care and Psychosocial/spiritual support  HPI/Patient Profile: 80 y.o. female  with past medical history of HTN/HLD, anxiety, gout, obesity admitted on 05-19-21 with acute hypoxic/hypercapnic respiratory distress which is resolved, acute toxic metabolic encephalopathy, acute kidney injury, acute GIB.  Found down in a cold anterior with hypothermia, hyponatremia to 114 admitted to ICU and found to have BRBPR concerning for GI bleed, EGD with clipping and epi injections 12/20.  Nuclear medicine study suggest bleeding from the duodenum EGD revealed an ulcer with exposed vessel in its base initial treatment included epi and clipping.  Vascular surgery on board along with GI.  Clinical Assessment and Goals of Care: I have reviewed medical records including EPIC notes, labs and imaging, received report from RN, assessed the patient.  Angela Sullivan is lying quietly in bed.  She greets me, making and mostly keeping eye contact.  She is alert, pleasant.  She is able to tell me her name, but not where we are.  There is no family at bedside at this time.  Angela Sullivan is able to tell me that her husband has been gone about 30 years and she has no children, no siblings.  She agrees for me to call her friend, Raford Pitcher.   Call to friend, Raford Pitcher to discuss diagnosis prognosis, GOC, EOL wishes, disposition and options.  I introduced Palliative Medicine as specialized medical care for people living with serious illness. It focuses on providing relief from the symptoms and stress of a serious illness. The goal is to improve quality of life for both the patient and the family.  We  discussed a brief life review of the patient.  Vikki Ports tells me that she worked with Glena Norfolk for about 15 years, and has known her for about 20.  She tells me that Angela Sullivan's husband is deceased, but she believes they were divorced before he died.  Angela Sullivan's children are also deceased and Vikki Ports is unaware of any grandchildren.    Vikki Ports shares that Angela Sullivan had been living independently, able to complete ADLs/IADLs.  She does share that [place] had fallen twice recently.  We then focused on their current illness.  General discussion held about Angela Sullivan's acute health concerns.    I asked Vikki Ports if she would be willing to be healthcare surrogate for Angela Sullivan, to make life and death decisions.  She tells me that she is unsure if she is able/willing to make life and death choices.  She says she is available to help, but unsure about being healthcare surrogate.  Discussed the importance of continued conversation with family and the medical providers regarding overall plan of care and treatment options, ensuring decisions are within the context of the patients values and GOCs.  Questions and concerns were addressed.  The family was  encouraged to call with questions or concerns.  PMT will continue to support holistically.  Conference with attending, bedside nursing staff, transition of care team related to patient condition, needs, goals of care, disposition.    HCPOA  OTHER -Angela Sullivan tells me that her husband has been gone for 30 years.  She tells me that she has no children, has no siblings.  She tells me that her friend, Raford Pitcher, helps care for her.  She agrees for me to call Vikki Ports.     SUMMARY OF RECOMMENDATIONS   At this point continue to treat the treatable Time for outcomes Anticipate need for short-term rehab   Code Status/Advance Care Planning: Full code -by default.  Unable to have CODE STATUS discussions with Angela Sullivan.  No family.  Symptom  Management:  Per hospitalist, no additional needs at this time.  Palliative Prophylaxis:  Frequent Pain Assessment, Oral Care, and Turn Reposition  Additional Recommendations (Limitations, Scope, Preferences): Treat the treatable, unable to have CODE STATUS discussions  Psycho-social/Spiritual:  Desire for further Chaplaincy support:no Additional Recommendations: Caregiving  Support/Resources and Education on Hospice  Prognosis:  Unable to determine, based on outcomes.  Guarded at this point.  Discharge Planning:  To be determined, anticipate need for short-term rehab at least       Primary Diagnoses: Present on Admission:  High cholesterol  Hypertension  Hyponatremia  New onset atrial fibrillation (HCC)  Elevated troponin  Abnormal LFTs  Rhabdomyolysis  Hypokalemia  SIRS (systemic inflammatory response syndrome) (HCC)  Hypothermia   I have reviewed the medical record, interviewed the patient and family, and examined the patient. The following aspects are pertinent.  Past Medical History:  Diagnosis Date   Anxiety    Gout    High cholesterol    Hypertension    Social History   Socioeconomic History   Marital status: Single    Spouse name: Not on file   Number of children: Not on file   Years of education: Not on file   Highest education level: Not on file  Occupational History    Employer: RETIRED  Tobacco Use   Smoking status: Never   Smokeless tobacco: Never  Vaping Use   Vaping Use: Never used  Substance and Sexual Activity   Alcohol use: Yes    Comment: occasional   Drug use: No   Sexual activity: Not on file  Other Topics Concern   Not on file  Social History Narrative   Not on file   Social Determinants of Health   Financial Resource Strain: Not on file  Food Insecurity: Not on file  Transportation Needs: Not on file  Physical Activity: Not on file  Stress: Not on file  Social Connections: Not on file   Family History  Problem  Relation Age of Onset   Heart disease Father    COPD Neg Hx    Diabetes Mellitus II Neg Hx    Scheduled Meds:  sodium chloride   Intravenous Once   chlorhexidine  15 mL Mouth Rinse BID   Chlorhexidine Gluconate Cloth  6 each Topical Q0600   feeding supplement  237 mL Oral TID BM   folic acid  1 mg Oral Daily   mouth rinse  15 mL Mouth Rinse q12n4p   multivitamin with minerals  1 tablet Oral Daily   [START ON 05/16/2021] pantoprazole  40 mg Intravenous Q12H   Continuous Infusions:  sodium chloride Stopped (05/13/21 0323)   sodium chloride 50 mL/hr at 05/13/21  1137   norepinephrine (LEVOPHED) Adult infusion Stopped (04/24/2021 2310)   pantoprazole 8 mg/hr (05/13/21 1317)   PRN Meds:.polyethylene glycol Medications Prior to Admission:  Prior to Admission medications   Medication Sig Start Date End Date Taking? Authorizing Provider  Cholecalciferol 1000 units tablet Take 5,000 Units by mouth daily.    [provider]  indomethacin (INDOCIN) 25 MG capsule Take 25 mg by mouth every 6 (six) hours as needed.    [provider]  lisinopril (PRINIVIL,ZESTRIL) 40 MG tablet Take 1 tablet (40 mg total) by mouth daily. 03/26/17 03/26/18  Houston Siren, MD  medroxyPROGESTERone (PROVERA) 10 MG tablet Take 1 tablet (10 mg total) by mouth daily. Patient not taking: Reported on 05/11/2021 07/29/20 10/22/21  Natale Milch, MD  Melatonin 5 MG TABS Take 2 tablets by mouth at bedtime.    [provider]  meloxicam (MOBIC) 7.5 MG tablet Take 7.5 mg by mouth 2 (two) times daily. Patient not taking: Reported on 05/11/2021    [provider]  omega-3 acid ethyl esters (LOVAZA) 1 g capsule Take 1 g by mouth daily.    [provider]  pravastatin (PRAVACHOL) 20 MG tablet Take 20 mg by mouth at bedtime.    [provider]   No Known Allergies Review of Systems  Unable to perform ROS: Acuity of condition   Physical Exam Vitals and nursing note  reviewed.  Constitutional:      General: She is not in acute distress.    Appearance: She is obese. She is ill-appearing.  Cardiovascular:     Rate and Rhythm: Normal rate.  Pulmonary:     Effort: Pulmonary effort is normal. No respiratory distress.  Skin:    General: Skin is warm and dry.  Neurological:     Mental Status: She is alert.     Comments: Oriented to self only  Psychiatric:        Mood and Affect: Mood normal.        Behavior: Behavior normal.     Comments: Calm and cooperative, not fearful    Vital Signs: BP (!) 114/55    Pulse 96    Temp 98.4 F (36.9 C) (Axillary)    Resp 17    Ht  (1.575 m)    Wt 87.5 kg    SpO2 98%    BMI 35.28 kg/m  Pain Scale: 0-10   Pain Score: 0-No pain   SpO2: SpO2: 98 % O2 Device:SpO2: 98 % O2 Flow Rate: .O2 Flow Rate (L/min): 2 L/min  IO: Intake/output summary:  Intake/Output Summary (Last 24 hours) at 05/13/2021 1342 Last data filed at 05/13/2021 1200 Gross per 24 hour  Intake 2043.23 ml  Output 835 ml  Net 1208.23 ml    LBM: Last BM Date: 05/21/2021 Baseline Weight: Weight: 86.9 kg Most recent weight: Weight: 87.5 kg     Palliative Assessment/Data:   Flowsheet Rows    Flowsheet Row Most Recent Value  Intake Tab   Referral Department Hospitalist  Unit at Time of Referral Intermediate Care Unit  Palliative Care Primary Diagnosis Sepsis/Infectious Disease  Date Notified 05/01/2021  Palliative Care Type New Palliative care  Reason for referral Clarify Goals of Care  Date of Admission 05/22/2021  Date first seen by Palliative Care 05/13/21  # of days Palliative referral response time 1 Day(s)  # of days IP prior to Palliative referral 2  Clinical Assessment   Palliative Performance Scale Score 30%  Pain Max last  24 hours Not able to report  Pain Min Last 24 hours Not able to report  Dyspnea Max Last 24 Hours Not able to report  Dyspnea Min Last 24 hours Not able to report  Psychosocial & Spiritual Assessment    Palliative Care Outcomes        Time In: 1110 Time Out: 1200 Time Total: 50 minutes  Greater than 50%  of this time was spent counseling and coordinating care related to the above assessment and plan.  Signed by: Katheran Awe, NP   Please contact Palliative Medicine Team phone at [phone] for questions and concerns.  For individual provider: See Loretha Stapler

## 2021-05-13 NOTE — Progress Notes (Signed)
[facility]  Daily Progress Note   Subjective  - 1 Day Post-Op  Patient sitting up in bed comfortable when I walked in.  She denies pain in her abdominal area.  She denies further bloody bowel movements.  This is confirmed with the staff there have been no further bloody bowel movements since endoscopy.  Hemoglobins have been a little up-and-down and she has received a total of 3 units.  Objective Vitals:   05/13/21 1100 05/13/21 1115 05/13/21 1130 05/13/21 1145  BP: 98/62 (!) 113/55 113/70 (!) 114/55  Pulse:  86  96  Resp: 16 13 17 17   Temp:   98.4 F (36.9 C)   TempSrc:   Axillary   SpO2:  98% 99% 98%  Weight:      Height:        Intake/Output Summary (Last 24 hours) at 05/13/2021 1810 Last data filed at 05/13/2021 1450 Gross per 24 hour  Intake 1759.06 ml  Output 685 ml  Net 1074.06 ml    PULM  Normal effort , no use of accessory muscles CV  No JVD, RRR Abd      Non-distended, nontender VASC  palpable DP pulses bilaterally  Laboratory CBC    Component Value Date/Time   WBC 17.9 (H) 05/13/2021 0350   HGB 9.0 (L) 05/13/2021 1244   HCT 24.7 (L) 05/13/2021 1244   PLT 133 (L) 05/13/2021 0350    BMET    Component Value Date/Time   NA 129 (L) 05/13/2021 1657   K 3.7 05/13/2021 0320   CL 105 05/13/2021 0320   CO2 20 (L) 05/13/2021 0320   GLUCOSE 109 (H) 05/13/2021 0320   BUN 42 (H) 05/13/2021 0320   CREATININE 1.00 05/13/2021 0320   CALCIUM 8.3 (L) 05/13/2021 0320   GFRNONAA 57 (L) 05/13/2021 0320   GFRAA >60 03/26/2017 0024    Assessment/Planning: 1.  Upper GI bleed: At the present time the patient has been clinically stable and there have been no further bloody bowel movements.  Her hemoglobin has been a little up-and-down but this could be the equilibration process.  No acute indication for emergent embolization.  We will continue to monitor her hemoglobin and continue with supportive care.  Given the location of the lesion  should she bleed again then embolization of the gastroduodenal artery may help control the hemorrhage.    We will continue to follow.   2.  Atrial fibrillation: Continue antiarrhythmia medications as already ordered, these medications have been reviewed and there are no changes at this time.   Given her GI bleed anticoagulation is contraindicated at this time.   3.  Hypertension: Given her recent bleed antihypertensive medications will be adjusted as needed.  Plan would be to return to her normal regime at discharge.   4.  Hyperlipidemia: Continue statin as ordered and reviewed, no changes at this time  13/07/2016  05/13/2021, 6:10 PM

## 2021-05-13 NOTE — Progress Notes (Signed)
CRITICAL CARE PROGRESS NOTE    Name: Angela Sullivan MRN: [id number] DOB: 11/21/1940     LOS: 3   SYNOPSIS 80 yo with PMH as below came in after being found down in cold interior.  Found to have hypothermia 37F and hyponatremia 114 with confusion. In ER nephrology was consulted and recommendation for 3NS at 30cc/hr.   CC Follow up severe hyponatremia Severe mental status changes  EVENTS 05/11/21- Patient remains with encephalopahty. Na is slowly imrpoving,  12/20 increased WOB, inability to maintain secretions poor cough reflex 12/20 s/p EGD duodenal ulcer with bleeding vessel-s/p EPI by GI 12/20 transfused 2 units  12/21 Na improved, mental status slightly improved   INTERVAL CHANGES Na is 129 GIB from duodenal ulcer Transfuse 1 unit today   Lines/tubes : Urethral Catheter Jenna, RN Temperature probe (Active)    Microbiology/Sepsis markers: Results for orders placed or performed during the hospital encounter of 05/01/2021  Urine Culture     Status: None   Collection Time: 05/15/2021  9:26 AM   Specimen: Urine, Random  Result Value Ref Range Status   Specimen Description   Final    URINE, RANDOM Performed at [facility], [address] [place], [place] [address]    Special Requests   Final    NONE Performed at [facility], [address], [place], [place] [address]    Culture   Final    NO GROWTH Performed at [facility], [address], [place], [place] [address]    Report Status 05/11/2021 FINAL  Final  Resp Panel by RT-PCR (Flu A&B, Covid)     Status: None   Collection Time: 05/09/2021  9:33 AM   Specimen: Nasopharyngeal(NP) swabs in vial transport medium  Result Value Ref Range Status   SARS Coronavirus 2 by RT PCR NEGATIVE NEGATIVE Final    Comment:  (NOTE) SARS-CoV-2 target nucleic acids are NOT DETECTED.  The SARS-CoV-2 RNA is generally detectable in upper respiratory specimens during the acute phase of infection. The lowest concentration of SARS-CoV-2 viral copies this assay can detect is 138 copies/mL. A negative result does not preclude SARS-Cov-2 infection and should not be used as the sole basis for treatment or other patient management decisions. A negative result may occur with  improper specimen collection/handling, submission of specimen other than nasopharyngeal swab, presence of viral mutation(s) within the areas targeted by this assay, and inadequate number of viral copies(<138 copies/mL). A negative result must be combined with clinical observations, patient history, and epidemiological information. The expected result is Negative.  Fact Sheet for Patients:  BloggerCourse.com  Fact Sheet for Healthcare Providers:  SeriousBroker.it  This test is no t yet approved or cleared by the [place] FDA and  has been authorized for detection and/or diagnosis of SARS-CoV-2 by FDA under an Emergency Use Authorization (EUA). This EUA will remain  in effect (meaning this test can be used) for the duration of the COVID-19 declaration under Section 564(b)(1) of the Act, 21 U.S.C.section 360bbb-3(b)(1), unless the authorization is terminated  or revoked sooner.       Influenza A by PCR NEGATIVE NEGATIVE Final   Influenza B by PCR NEGATIVE NEGATIVE Final    Comment: (NOTE) The Xpert Xpress SARS-CoV-2/FLU/RSV plus assay is intended as an aid in the diagnosis of influenza from Nasopharyngeal swab specimens and should not be used as a sole basis for treatment. Nasal washings and aspirates are unacceptable for Xpert Xpress SARS-CoV-2/FLU/RSV testing.  Fact Sheet for  Patients: BloggerCourse.com  Fact Sheet for Healthcare  Providers: SeriousBroker.it  This test is not yet approved or cleared by the [place] FDA and has been authorized for detection and/or diagnosis of SARS-CoV-2 by FDA under an Emergency Use Authorization (EUA). This EUA will remain in effect (meaning this test can be used) for the duration of the COVID-19 declaration under Section 564(b)(1) of the Act, 21 U.S.C. section 360bbb-3(b)(1), unless the authorization is terminated or revoked.  Performed at [facility], [address] [place], [place] [address]   Blood Culture (routine x 2)     Status: None (Preliminary result)   Collection Time: 05/07/2021  9:34 AM   Specimen: BLOOD  Result Value Ref Range Status   Specimen Description BLOOD RIGHT ANTECUBITAL  Final   Special Requests   Final    BOTTLES DRAWN AEROBIC AND ANAEROBIC Blood Culture results may not be optimal due to an excessive volume of blood received in culture bottles   Culture   Final    NO GROWTH 2 DAYS Performed at [facility], [address], [place], [place] [address]    Report Status PENDING  Incomplete  MRSA Next Gen by PCR, Nasal     Status: None   Collection Time: 05/13/2021  6:16 PM   Specimen: Nasal Mucosa; Nasal Swab  Result Value Ref Range Status   MRSA by PCR Next Gen NOT DETECTED NOT DETECTED Final    Comment: (NOTE) The GeneXpert MRSA Assay (FDA approved for NASAL specimens only), is one component of a comprehensive MRSA colonization surveillance program. It is not intended to diagnose MRSA infection nor to guide or monitor treatment for MRSA infections. Test performance is not FDA approved in patients less than 80 years old. Performed at [facility], [address] [place], [place] [address]   Blood Culture (routine x 2)     Status: None (Preliminary result)   Collection Time: 05/01/2021  6:50 PM   Specimen: BLOOD  Result Value Ref Range Status   Specimen Description BLOOD BLOOD RIGHT HAND   Final   Special Requests   Final    BOTTLES DRAWN AEROBIC ONLY Blood Culture results may not be optimal due to an inadequate volume of blood received in culture bottles   Culture   Final    NO GROWTH 2 DAYS Performed at [facility], [address] [place], [place] [address]    Report Status PENDING  Incomplete    Anti-infectives:  Anti-infectives (From admission, onward)    None        MEDICATIONS   Current Medication:  Current Facility-Administered Medications:    0.9 %  sodium chloride infusion (Manually program via Guardrails IV Fluids), , Intravenous, Once, [place], [employer], NP   0.9 %  sodium chloride infusion, 250 mL, Intravenous, Continuous, Harlon Ditty D, NP, Stopped at 05/13/21 0323   chlorhexidine (PERIDEX) 0.12 % solution 15 mL, 15 mL, Mouth Rinse, BID, Dillon Mcreynolds, MD, 15 mL at 05/17/2021 2230   Chlorhexidine Gluconate Cloth 2 % PADS 6 each, 6 each, Topical, Q0600, Vida Rigger, MD, 6 each at 05/02/2021 0900   feeding supplement (ENSURE ENLIVE / ENSURE PLUS) liquid 237 mL, 237 mL, Oral, TID BM, Aleskerov, Fuad, MD, 237 mL at 05/11/21 1935   MEDLINE mouth rinse, 15 mL, Mouth Rinse, q12n4p, Lyman Balingit, MD, 15 mL at 05/21/2021 1510   multivitamin with minerals tablet 1 tablet, 1 tablet, Oral, Daily, Vida Rigger, MD   norepinephrine (LEVOPHED) 4mg  in  (0.016 mg/mL) premix infusion, 2-10 mcg/min, Intravenous, Titrated, Harlon Ditty D, NP, Stopped at 05/11/2021 2310   [START ON 05/16/2021] pantoprazole (PROTONIX) injection 40 mg, 40 mg, Intravenous, Q12H, Vanga, Loel Dubonnet, MD   pantoprozole (PROTONIX) 80 mg /NS 100 mL infusion, 8 mg/hr, Intravenous, Continuous, Vanga, Loel Dubonnet, MD, Last Rate: 10 mL/hr at 05/13/21 0600, 8 mg/hr at 05/13/21 0600   polyethylene glycol (MIRALAX / GLYCOLAX) packet 17 g, 17 g, Oral, Daily PRN, Vida Rigger, MD     PHYSICAL EXAMINATION   Vital Signs: Temp:  [97.2 F (36.2 C)-98.6 F (37 C)] 98 F  (36.7 C) (12/21 0400) Pulse Rate:  [60-110] 97 (12/21 0600) Resp:  [12-30] 19 (12/21 0600) BP: (62-131)/(36-65) 119/58 (12/21 0600) SpO2:  [96 %-100 %] 96 % (12/21 0600) FiO2 (%):  [28 %] 28 % (12/20 1700) Weight:  [87.5 kg] 87.5 kg (12/21 0500)   Review of Systems: Confused  Pleasant Minimal oxygen Other:  All other systems negative  PHYSICAL EXAMINATION:  GENERAL: ill appearing EYES: Pupils equal, round, reactive to light.  No scleral icterus.  MOUTH: Moist mucosal membrane.  NECK: Supple.  PULMONARY: +rhonchi,  CARDIOVASCULAR: S1 and S2.  No murmurs  GASTROINTESTINAL: +distended.  MUSCULOSKELETAL: +edema.  NEUROLOGIC: intermittently disoriented   LAB RESULTS:  Basic Metabolic Panel: Recent Labs  Lab 2021-05-14 0928 05/14/21 1126 05-14-21 1225 05/11/21 0331 05/11/21 0656 05/20/2021 0246 04/26/2021 0630 04/24/2021 1240 04/29/2021 1807 05/09/2021 2150 05/13/21 0320 05/13/21 0639  NA 114* 114*   < > 118*   < > 124*   < > 127* 130* 127* 129* 129*  K 3.3* 3.7  --  3.6  --  3.3*  --   --   --   --  3.7  --   CL 82* 83*  --  90*  --  97*  --   --   --   --  105  --   CO2 20* 21*  --  17*  --  22  --   --   --   --  20*  --   GLUCOSE 96 101*  --  82  --  123*  --   --   --   --  109*  --   BUN 23 23  --  28*  --  36*  --   --   --   --  42*  --   CREATININE 0.97 1.05*  --  1.35*  --  1.31*  --   --   --   --  1.00  --   CALCIUM 8.4* 8.5*  --  8.2*  --  8.5*  --   --   --   --  8.3*  --   MG  --   --   --  1.5*  --  2.0  --   --   --   --  1.7  --   PHOS  --   --   --  3.4  --   --   --   --   --   --  2.4*  --    < > = values in this interval not displayed.    Liver Function Tests: Recent Labs  Lab May 14, 2021 0928  AST 117*  ALT 38  ALKPHOS 113  BILITOT 3.3*  PROT 6.1*  ALBUMIN 3.4*    No results for input(s): LIPASE, AMYLASE in the last 168 hours. No results for input(s): AMMONIA in the last  168 hours. CBC: Recent Labs  Lab 05/11/2021 0928 05/11/21 0331  05/10/2021 0246 05/02/2021 1240 04/26/2021 1807 05/13/21 0350  WBC 16.8* 17.0* 13.7*  --   --  17.9*  NEUTROABS 14.4*  --   --   --   --   --   HGB 8.2* 7.1* 6.2* 7.0* 8.0* 6.8*  HCT 22.2* 19.2* 17.7* 19.8* 22.0* 18.4*  MCV 100.9* 101.6* 104.7*  --   --  92.5  PLT 152 143* 150  --   --  133*    Cardiac Enzymes: Recent Labs  Lab 04/23/2021 0928  CKTOTAL 1,181*    BNP: Invalid input(s): POCBNP CBG: Recent Labs  Lab 05/02/2021 0923 05/17/2021 1811 05/11/2021 1951 05/21/2021 2328 05/08/2021 1631  GLUCAP 100* 80 82 85 103*        IMAGING RESULTS:  Imaging:  @ NM GI Blood Loss  Result Date: 04/26/2021 CLINICAL DATA:  Bright red blood per rectum also mixed with melena by report obtained from nursing staff in nuclear medicine during the examination. EXAM: NUCLEAR MEDICINE GASTROINTESTINAL BLEEDING SCAN TECHNIQUE: Sequential abdominal images were obtained following intravenous administration of Tc-81m labeled red blood cells. RADIOPHARMACEUTICALS:  20.92 mCi Tc-51m pertechnetate in-vitro labeled red cells. COMPARISON:  No recent comparison imaging is available. There is imaging from 2007. FINDINGS: Initial images show some activity in the LEFT lower quadrant. Subtle activity noted over the descending duodenum/midline early in the examination as well. Later in the examination more pronounced activity is demonstrated over the expected location of the descending duodenum and serpiginous appearance of numerous areas of activity over the LEFT hemiabdomen most suggestive of labelled blood products filling jejunal loops beyond suspected site of bleeding in the proximal and or descending duodenum. IMPRESSION: Signs of active bleeding favored to originate from the descending duodenum with blood products passing into the jejunum on more delayed images and intermittently throughout the scan. Critical Value/emergent results were called by telephone at the time of interpretation on 04/24/2021 at 12:30  pm to provider Harlon Ditty, NP , who verbally acknowledged these results. Electronically Signed   By: Donzetta Kohut M.D.   On: 05/06/2021 12:31     ASSESSMENT AND PLAN   80 yo white female with severe hyponatremia with active GIB with toxic metabolic encephalopathy probably sever dehydration with ARF with Rhabdomyolysis  Severe ACUTE Hypoxic and Hypercapnic Respiratory distress-resolved  NEUROLOGY ACUTE TOXIC METABOLIC ENCEPHALOPATHY-slowly improving   ACUTE KIDNEY INJURY/Renal Failure -continue Foley Catheter-assess need -Avoid nephrotoxic agents -Follow urine output, BMP -Ensure adequate renal perfusion, optimize oxygenation -Renal dose medications Follow up Nephrology recs   Intake/Output Summary (Last 24 hours) at 05/13/2021 0726 Last data filed at 05/13/2021 0600 Gross per 24 hour  Intake 2315.82 ml  Output 1200 ml  Net 1115.82 ml    BMP Latest Ref Rng & Units 05/13/2021 05/13/2021 05/13/2021  Glucose 70 - 99 mg/dL - 409(W) -  BUN 8 - 23 mg/dL - 11(B) -  Creatinine 1.47 - 1.00 mg/dL - 8.29 -  Sodium 562 - 145 mmol/L 129(L) 129(L) 127(L)  Potassium 3.5 - 5.1 mmol/L - 3.7 -  Chloride 98 - 111 mmol/L - 105 -  CO2 22 - 32 mmol/L - 20(L) -  Calcium 8.9 - 10.3 mg/dL - 8.3(L) -       INFECTIOUS DISEASE -continue antibiotics as prescribed -follow up cultures   +GIB GI CONSULTED BLEEDING SCAN PENDING PPI BID KEEP NPO Follow up GI recs  ACUTE ANEMIA- TRANSFUSE AS NEEDED CONSIDER TRANSFUSION  IF HGB<7  DVT PRX with TED/SCD's ONLY    DVT/GI PRX  assessed I Assessed the need for Labs I Assessed the need for Foley I Assessed the need for Central Venous Line Family Discussion when available I Assessed the need for Mobilization I made an Assessment of medications to be adjusted accordingly Safety Risk assessment completed  CASE DISCUSSED IN MULTIDISCIPLINARY ROUNDS WITH ICU TEAM   Consider Transferring to [facility], [place] status  Alyha Marines Santiago Glad, M.D.   [facility]  Medical Director [facility] Medical Director [facility] Cardio-Pulmonary Department

## 2021-05-13 NOTE — Progress Notes (Signed)
Date and time results received: 05/13/21 0540 (use smartphrase ".now" to insert current time)  Test: Hemoglobin Critical Value: 6.8  Name of Provider Notified: Webb Silversmith NP  Orders Received? Or Actions Taken?: Actions Taken: Awaiting orders

## 2021-05-14 DIAGNOSIS — E871 Hypo-osmolality and hyponatremia: Secondary | ICD-10-CM | POA: Diagnosis not present

## 2021-05-14 DIAGNOSIS — E876 Hypokalemia: Secondary | ICD-10-CM

## 2021-05-14 DIAGNOSIS — I10 Essential (primary) hypertension: Secondary | ICD-10-CM

## 2021-05-14 DIAGNOSIS — R7989 Other specified abnormal findings of blood chemistry: Secondary | ICD-10-CM | POA: Diagnosis not present

## 2021-05-14 DIAGNOSIS — I4891 Unspecified atrial fibrillation: Secondary | ICD-10-CM

## 2021-05-14 DIAGNOSIS — K264 Chronic or unspecified duodenal ulcer with hemorrhage: Secondary | ICD-10-CM | POA: Diagnosis not present

## 2021-05-14 DIAGNOSIS — E7800 Pure hypercholesterolemia, unspecified: Secondary | ICD-10-CM

## 2021-05-14 DIAGNOSIS — R778 Other specified abnormalities of plasma proteins: Secondary | ICD-10-CM

## 2021-05-14 DIAGNOSIS — K922 Gastrointestinal hemorrhage, unspecified: Secondary | ICD-10-CM | POA: Diagnosis not present

## 2021-05-14 DIAGNOSIS — T68XXXA Hypothermia, initial encounter: Secondary | ICD-10-CM | POA: Diagnosis not present

## 2021-05-14 LAB — PREPARE RBC (CROSSMATCH)

## 2021-05-14 LAB — BASIC METABOLIC PANEL
Anion gap: 6 (ref 5–15)
BUN: 39 mg/dL — ABNORMAL HIGH (ref 8–23)
CO2: 19 mmol/L — ABNORMAL LOW (ref 22–32)
Calcium: 8.4 mg/dL — ABNORMAL LOW (ref 8.9–10.3)
Chloride: 105 mmol/L (ref 98–111)
Creatinine, Ser: 0.98 mg/dL (ref 0.44–1.00)
GFR, Estimated: 58 mL/min — ABNORMAL LOW (ref >60)
Glucose, Bld: 100 mg/dL — ABNORMAL HIGH (ref 70–99)
Potassium: 3.7 mmol/L (ref 3.5–5.1)
Sodium: 130 mmol/L — ABNORMAL LOW (ref 135–145)

## 2021-05-14 LAB — CBC
HCT: 23.8 % — ABNORMAL LOW (ref 36.0–46.0)
Hemoglobin: 8.5 g/dL — ABNORMAL LOW (ref 12.0–15.0)
MCH: 33.9 pg (ref 26.0–34.0)
MCHC: 35.7 g/dL (ref 30.0–36.0)
MCV: 94.8 fL (ref 80.0–100.0)
Platelets: 125 10*3/uL — ABNORMAL LOW (ref 150–400)
RBC: 2.51 MIL/uL — ABNORMAL LOW (ref 3.87–5.11)
RDW: 20.2 % — ABNORMAL HIGH (ref 11.5–15.5)
WBC: 13.4 10*3/uL — ABNORMAL HIGH (ref 4.0–10.5)
nRBC: 1.1 % — ABNORMAL HIGH (ref 0.0–0.2)

## 2021-05-14 LAB — SODIUM
Sodium: 130 mmol/L — ABNORMAL LOW (ref 135–145)
Sodium: 130 mmol/L — ABNORMAL LOW (ref 135–145)
Sodium: 132 mmol/L — ABNORMAL LOW (ref 135–145)
Sodium: 132 mmol/L — ABNORMAL LOW (ref 135–145)
Sodium: 132 mmol/L — ABNORMAL LOW (ref 135–145)

## 2021-05-14 LAB — HEMOGLOBIN AND HEMATOCRIT, BLOOD
HCT: 24 % — ABNORMAL LOW (ref 36.0–46.0)
Hemoglobin: 8.5 g/dL — ABNORMAL LOW (ref 12.0–15.0)

## 2021-05-14 MED ORDER — POLYETHYLENE GLYCOL 3350 17 G PO PACK
17.0000 g | PACK | Freq: Every day | ORAL | Status: DC
  Administered 2021-05-14 – 2021-05-20 (×6): 17 g via ORAL
  Filled 2021-05-14 (×6): qty 1

## 2021-05-14 MED ORDER — MORPHINE SULFATE (PF) 2 MG/ML IV SOLN
2.0000 mg | Freq: Once | INTRAVENOUS | Status: AC
  Administered 2021-05-14: 04:00:00 2 mg via INTRAVENOUS
  Filled 2021-05-14: qty 1

## 2021-05-14 MED ORDER — SODIUM CHLORIDE 0.9 % IV BOLUS
1000.0000 mL | Freq: Once | INTRAVENOUS | Status: AC
  Administered 2021-05-14: 09:00:00 1000 mL via INTRAVENOUS

## 2021-05-14 NOTE — Progress Notes (Signed)
Angela Repress, MD [address]  [address]  [place], [place] [address]  Main: [phone]  Fax: [phone] Pager: [phone]   Subjective: No acute events overnight.  She did not have any witnessed episodes of rectal bleeding or black stools.  Hemoglobin has been stable within last 24 hours.  Objective: Vital signs in last 24 hours: Vitals:   05/14/21 0600 05/14/21 0700 05/14/21 0800 05/14/21 0900  BP: (!) 92/57 102/66  (!) 75/52  Pulse: 91 94 87 81  Resp: 13 13 (!) 8 (!) 8  Temp:    98 F (36.7 C)  TempSrc:    Oral  SpO2: 96% 94% 93% 93%  Weight:      Height:       Weight change: 5.5 kg  Intake/Output Summary (Last 24 hours) at 05/14/2021 1321 Last data filed at 05/14/2021 0902 Gross per 24 hour  Intake 2186.5 ml  Output 595 ml  Net 1591.5 ml     Exam: Heart:: Regular rate and rhythm, S1S2 present, or without murmur or extra heart sounds Lungs: normal and clear to auscultation Abdomen: soft, nontender, normal bowel sounds   Lab Results: CBC Latest Ref Rng & Units 05/14/2021 05/14/2021 05/13/2021  WBC 4.0 - 10.5 K/uL - 13.4(H) -  Hemoglobin 12.0 - 15.0 g/dL 6.0(O) 4.5(T) 9.7(F)  Hematocrit 36.0 - 46.0 % 24.0(L) 23.8(L) 24.7(L)  Platelets 150 - 400 K/uL - 125(L) -   CMP Latest Ref Rng & Units 05/14/2021 05/14/2021 05/14/2021  Glucose 70 - 99 mg/dL - 414(E) -  BUN 8 - 23 mg/dL - 39(R) -  Creatinine 3.20 - 1.00 mg/dL - 2.33 -  Sodium 435 - 145 mmol/L 130(L) 130(L) 130(L)  Potassium 3.5 - 5.1 mmol/L - 3.7 -  Chloride 98 - 111 mmol/L - 105 -  CO2 22 - 32 mmol/L - 19(L) -  Calcium 8.9 - 10.3 mg/dL - 6.8(S) -  Total Protein 6.5 - 8.1 g/dL - - -  Total Bilirubin 0.3 - 1.2 mg/dL - - -  Alkaline Phos 38 - 126 U/L - - -  AST 15 - 41 U/L - - -  ALT 0 - 44 U/L - - -   Iron/TIBC/Ferritin/ %Sat    Component Value Date/Time   IRON 26 (L) Jun 08, 2021 0630   TIBC 235 (L) 08-Jun-2021 0630   FERRITIN 252 06-08-2021 0630   IRONPCTSAT 11 2021/06/08 0630     Micro Results: Recent Results (from the past 240 hour(s))  Urine Culture     Status: None   Collection Time: 05/17/2021  9:26 AM   Specimen: Urine, Random  Result Value Ref Range Status   Specimen Description   Final    URINE, RANDOM Performed at [facility], [address], [place], [place] [address]    Special Requests   Final    NONE Performed at [facility], [address], [place], [place] [address]    Culture   Final    NO GROWTH Performed at [facility], [address] [place], [place] [address]    Report Status 05/11/2021 FINAL  Final  Resp Panel by RT-PCR (Flu A&B, Covid)     Status: None   Collection Time: 05/21/2021  9:33 AM   Specimen: Nasopharyngeal(NP) swabs in vial transport medium  Result Value Ref Range Status   SARS Coronavirus 2 by RT PCR NEGATIVE NEGATIVE Final    Comment: (NOTE) SARS-CoV-2 target nucleic acids are NOT DETECTED.  The SARS-CoV-2 RNA  is generally detectable in upper respiratory specimens during the acute phase of infection. The lowest concentration of SARS-CoV-2 viral copies this assay can detect is 138 copies/mL. A negative result does not preclude SARS-Cov-2 infection and should not be used as the sole basis for treatment or other patient management decisions. A negative result may occur with  improper specimen collection/handling, submission of specimen other than nasopharyngeal swab, presence of viral mutation(s) within the areas targeted by this assay, and inadequate number of viral copies(<138 copies/mL). A negative result must be combined with clinical observations, patient history, and epidemiological information. The expected result is Negative.  Fact Sheet for Patients:  EntrepreneurPulse.com.au  Fact Sheet for Healthcare Providers:  IncredibleEmployment.be  This test is no t yet approved or cleared by the [place] FDA and  has been authorized for  detection and/or diagnosis of SARS-CoV-2 by FDA under an Emergency Use Authorization (EUA). This EUA will remain  in effect (meaning this test can be used) for the duration of the COVID-19 declaration under Section 564(b)(1) of the Act, 21 U.S.C.section 360bbb-3(b)(1), unless the authorization is terminated  or revoked sooner.       Influenza A by PCR NEGATIVE NEGATIVE Final   Influenza B by PCR NEGATIVE NEGATIVE Final    Comment: (NOTE) The Xpert Xpress SARS-CoV-2/FLU/RSV plus assay is intended as an aid in the diagnosis of influenza from Nasopharyngeal swab specimens and should not be used as a sole basis for treatment. Nasal washings and aspirates are unacceptable for Xpert Xpress SARS-CoV-2/FLU/RSV testing.  Fact Sheet for Patients: EntrepreneurPulse.com.au  Fact Sheet for Healthcare Providers: IncredibleEmployment.be  This test is not yet approved or cleared by the [place] FDA and has been authorized for detection and/or diagnosis of SARS-CoV-2 by FDA under an Emergency Use Authorization (EUA). This EUA will remain in effect (meaning this test can be used) for the duration of the COVID-19 declaration under Section 564(b)(1) of the Act, 21 U.S.C. section 360bbb-3(b)(1), unless the authorization is terminated or revoked.  Performed at [facility], [address] [place], [place] [address]   Blood Culture (routine x 2)     Status: None (Preliminary result)   Collection Time: 05/14/2021  9:34 AM   Specimen: BLOOD  Result Value Ref Range Status   Specimen Description BLOOD RIGHT ANTECUBITAL  Final   Special Requests   Final    BOTTLES DRAWN AEROBIC AND ANAEROBIC Blood Culture results may not be optimal due to an excessive volume of blood received in culture bottles   Culture   Final    NO GROWTH 4 DAYS Performed at [facility], [address], [place], [place] [address]    Report Status PENDING  Incomplete   MRSA Next Gen by PCR, Nasal     Status: None   Collection Time: 04/28/2021  6:16 PM   Specimen: Nasal Mucosa; Nasal Swab  Result Value Ref Range Status   MRSA by PCR Next Gen NOT DETECTED NOT DETECTED Final    Comment: (NOTE) The GeneXpert MRSA Assay (FDA approved for NASAL specimens only), is one component of a comprehensive MRSA colonization surveillance program. It is not intended to diagnose MRSA infection nor to guide or monitor treatment for MRSA infections. Test performance is not FDA approved in patients less than 48 years old. Performed at [facility], [address] [place], [place] [address]   Blood Culture (routine x 2)     Status: None (Preliminary result)   Collection Time: 05/23/2021  6:50  PM   Specimen: BLOOD  Result Value Ref Range Status   Specimen Description BLOOD BLOOD RIGHT HAND  Final   Special Requests   Final    BOTTLES DRAWN AEROBIC ONLY Blood Culture results may not be optimal due to an inadequate volume of blood received in culture bottles   Culture   Final    NO GROWTH 4 DAYS Performed at [facility], [address], [place], [place] [address]    Report Status PENDING  Incomplete   Studies/Results: No results found. Medications: I have reviewed the patient's current medications. Prior to Admission:  Medications Prior to Admission  Medication Sig Dispense Refill Last Dose   Cholecalciferol 1000 units tablet Take 5,000 Units by mouth daily.      indomethacin (INDOCIN) 25 MG capsule Take 25 mg by mouth every 6 (six) hours as needed.      lisinopril (PRINIVIL,ZESTRIL) 40 MG tablet Take 1 tablet (40 mg total) by mouth daily. 30 tablet 1    Melatonin 5 MG TABS Take 2 tablets by mouth at bedtime.      omega-3 acid ethyl esters (LOVAZA) 1 g capsule Take 1 g by mouth daily.      pravastatin (PRAVACHOL) 20 MG tablet Take 20 mg by mouth at bedtime.      Scheduled:  sodium chloride   Intravenous Once   chlorhexidine  15 mL Mouth Rinse  BID   Chlorhexidine Gluconate Cloth  6 each Topical Q0600   feeding supplement  237 mL Oral TID BM   folic acid  1 mg Oral Daily   mouth rinse  15 mL Mouth Rinse q12n4p   multivitamin with minerals  1 tablet Oral Daily   [START ON 05/16/2021] pantoprazole  40 mg Intravenous Q12H   polyethylene glycol  17 g Oral Daily   Continuous:  sodium chloride Stopped (05/13/21 1134)   sodium chloride 50 mL/hr at 05/14/21 0800   pantoprazole 8 mg/hr (05/14/21 1128)   PRN: Anti-infectives (From admission, onward)    None      Scheduled Meds:  sodium chloride   Intravenous Once   chlorhexidine  15 mL Mouth Rinse BID   Chlorhexidine Gluconate Cloth  6 each Topical Q0600   feeding supplement  237 mL Oral TID BM   folic acid  1 mg Oral Daily   mouth rinse  15 mL Mouth Rinse q12n4p   multivitamin with minerals  1 tablet Oral Daily   [START ON 05/16/2021] pantoprazole  40 mg Intravenous Q12H   polyethylene glycol  17 g Oral Daily   Continuous Infusions:  sodium chloride Stopped (05/13/21 1134)   sodium chloride 50 mL/hr at 05/14/21 0800   pantoprazole 8 mg/hr (05/14/21 1128)   PRN Meds:.   Assessment: Principal Problem:   Hypothermia Active Problems:   Hyponatremia   High cholesterol   Hypertension   Fall at home, initial encounter   New onset atrial fibrillation (HCC)   Elevated troponin   Abnormal LFTs   Rhabdomyolysis   Hypokalemia   SIRS (systemic inflammatory response syndrome) (HCC)   Pressure injury of skin  Angela Sullivan is a 80 y.o. female with history of hypertension is admitted with altered mental status secondary to severe hyponatremia and severe microcytic anemia, melena and hematochezia  Plan: Acute blood loss anemia with melena and hematochezia: No active bleeding at this time and has been hemodynamically stable.  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  There were several clean-based duodenal ulcers in second portion of duodenum concerning for ischemia or secondary to NSAID use Continue Protonix drip for 72 hours (last day today) and then switch to 40 mg twice daily, long term Maintain 2 large-bore IVs Closely monitor hemoglobin to maintain above 7 Advance to soft diet Agree with palliative care consult Has severe folic acid deficiency, continue folic acid 1 mg daily Avoid NSAID use Start bowel regimen, MiraLAX daily.  Expect to have few initial bowel movements to be black If she demonstrates any signs of recurrent bleeding, please consult GI back for possible second look and if not able to treat again, next step would be embolization  GI will sign off at this time, please call us back with questions or concerns     LOS: 4 days   Angela Sullivan 05/14/2021, 1:21 PM

## 2021-05-14 NOTE — Progress Notes (Addendum)
[facility] SURGICAL PROGRESS NOTE (cpt [phone])  Hospital Day(s): 4.   Interval History: Patient seen and examined, no acute events or new complaints overnight. Patient much more confused again overnight and into this morning, she does not participate in history at all. Hgb this morning is 8.5 (from 9.0 after transfusion yesterday). Plan to transfuse another unit pRBCs today. Renal function remains normal; scr - 0.98; UO - 830 ccs. Hyponatremia is improved to 130. Discussed with RN this morning at bedside, no further bowel movements overnight, no blood per rectum either reported.   Review of Systems:  Unable to preform reliably secondary to mental acuity/poor historian  Vital signs in last 24 hours: [min-max] current  Temp:  [98 F (36.7 C)-98.5 F (36.9 C)] 98.1 F (36.7 C) (12/22 0400) Pulse Rate:  [58-105] 91 (12/22 0600) Resp:  [11-30] 13 (12/22 0600) BP: (87-137)/(37-109) 92/57 (12/22 0600) SpO2:  [93 %-100 %] 96 % (12/22 0600) Weight:  [93 kg] 93 kg (12/22 0500)     Height: 5\' 2"  (157.5 cm) Weight: 93 kg BMI (Calculated): 37.49   Intake/Output last 2 shifts:  12/21 0701 - 12/22 0700 In: 2068.8 [P.O.:1080; I.V.:612.6; Blood:376.3] Out: 830 [Urine:830]   Physical Exam:  Constitutional: confused this morning, NAD HENT: normocephalic without obvious abnormality  Respiratory: breathing non-labored at rest, on [place] Cardiovascular: regular rate and sinus rhythm  Gastrointestinal: Abdomen is obese, soft, she does not appear tender, non-distended, no rebound/guarding, she does not appear peritonitic. She does have a previous open cholecystectomy scar Genitourinary: Foley in place Musculoskeletal: Safety mittens in place   Labs:  CBC Latest Ref Rng & Units 05/14/2021 05/13/2021 05/13/2021  WBC 4.0 - 10.5 K/uL 13.4(H) - 17.9(H)  Hemoglobin 12.0 - 15.0 g/dL 05/15/2021) 0.9(N) 6.8(L)  Hematocrit 36.0 - 46.0 % 23.8(L) 24.7(L) 18.4(L)  Platelets 150 - 400 K/uL 125(L) - 133(L)    CMP Latest Ref Rng & Units 05/14/2021 05/14/2021 05/13/2021  Glucose 70 - 99 mg/dL 05/15/2021) - -  BUN 8 - 23 mg/dL 573(U) - -  Creatinine 20(U - 1.00 mg/dL 5.42 - -  Sodium 7.06 - 145 mmol/L 130(L) 130(L) 129(L)  Potassium 3.5 - 5.1 mmol/L 3.7 - -  Chloride 98 - 111 mmol/L 105 - -  CO2 22 - 32 mmol/L 19(L) - -  Calcium 8.9 - 10.3 mg/dL 237) - -  Total Protein 6.5 - 8.1 g/dL - - -  Total Bilirubin 0.3 - 1.2 mg/dL - - -  Alkaline Phos 38 - 126 U/L - - -  AST 15 - 41 U/L - - -  ALT 0 - 44 U/L - - -     Imaging studies: No new pertinent imaging studies   Assessment/Plan: (ICD-10's: K86.2) 80 y.o. female presenting after being found down with hypothermia, hyponatremia to 114 and admitted to ICU who subsequently was found to have BRBPR concerning for GI bleed, duodenal source, s/p EGD with clipping and epinephrine injection for hemostasis on 12/20   - Appreciate GI assistance; EGD reviewed - Vascular surgery on board as well - We will continue to follow closely for re-bleeding or failure to resolve this with more minimally invasive procedures. This morning, she is hemodynamically stable, and Hgb has remained stable after transfusion yesterday, no overt evidence of continued bleeding. If we fail to get source control but have localization, then we will need to consider potential surgical interventions. She is certainly not an optimal candidate in this manor given her other comorbidities and very poor functional  status.   - Agree with PPI; initiated yesterday (12/20) - Monitor abdominal examination; on-going bowel function - Monitor H&H; transfuse <7  - Pain control prn; antiemetics - Further management per primary service; we will be following closely  All of the above findings and recommendations were discussed with the medical team and patient's bedside RN.   -- Edison Simon, PA-C [facility] 05/14/2021, 7:22 AM [phone] M-F: 7am - 4pm

## 2021-05-14 NOTE — Progress Notes (Signed)
PROGRESS NOTE    Angela Sullivan  ONG:[id number] DOB: [birth year] DOA: 05/21/2021 PCP: Kandyce Rud, MD    Brief Narrative:  Angela Sullivan is an 80 year old female with past medical history significant for essential hypertension, hyperlipidemia who presented to [facility] ED on 12/18 after being found down at home.  Upon evaluation in ED patient was noted to be hypothermic with severe hyponatremia, confusion, and acute renal failure.  Additionally patient was noted to have bloody stools and underwent EGD and was found to have duodenal ulcers with visible vessels s/p injection and clipping.  During her ICU stay, renal failure improved as well as her sodium level.  Palliative care has been consulted as patient remains confused.  Patient was transferred to hospitalist service on 05/14/2021.   Assessment & Plan:   Principal Problem:   Hypothermia Active Problems:   Hyponatremia   High cholesterol   Hypertension   Fall at home, initial encounter   New onset atrial fibrillation (HCC)   Elevated troponin   Abnormal LFTs   Rhabdomyolysis   Hypokalemia   SIRS (systemic inflammatory response syndrome) (HCC)   Pressure injury of skin   Upper GI bleed secondary to duodenal ulcers Patient was noted to have drop in hemoglobin 6.2.  Nuclear medicine GI bleeding scan with active bleeding descending duodenum.  Patient on indomethacin at home.  Gastroenterology was consulted and patient underwent EGD on 05/23/2021 with findings of several duodenal ulcers 1 with visible vessel s/p epinephrine injection and clipping.  Seen by general surgery, poor surgical candidate. --Hgb 8.2>7.1>6.2>8.0>6.8>9.0>8.5; stable --s/p  2u pRBC on 12/20 and 1u pRBC on 12/21 --Continue Protonix drip for 72 hours followed by Protonix 40 mg IV q12h --CBC daily --Avoidance of NSAIDs --Closely monitor bowel movements --If rebleeds may need repeat EGD versus consideration of embolization  Acute metabolic encephalopathy Patient  was found to be confused on arrival, likely related to severe hyponatremia; which is improving. --Supportive care --Check ammonia level, VBG in the a.m.  Severe hyponatremia Etiology likely secondary to dehydration from hypovolemic hyponatremia in the setting of poor oral intake and being found down for unknown known amount of time at home.  Patient was in ID confused on arrival with a sodium level of 114.  Nephrology was consulted and recommended IV fluid hydration. --Na 114>>>130 --Continue NS at 50 mL/hr --BMP daily  Acute renal failure: Resolved Lactic acidosis Creatinine elevated to the 1.35, with lactic acid elevated to 3.6.  Likely secondary to prerenal azotemia from dehydration.  Resolved with IV fluid hydration. --Cr 1.35>>0.98 --BMP daily; repeat lactic acid in a.m.  Folate deficiency --Folic acid 1 mg p.o. daily  Rhabdomyolysis CK level on admission 1185, unclear amount of downtime at home.  Supported with IV fluid hydration.  Hypotension Hx essential hypertension Patient initially required vasopressors with Levophed while under PCCM care in the ICU.  Vasopressors has been titrated off.  Likely etiology from acute blood loss anemia from GI bleed as above and severe dehydration from unclear amount of downtime.  On lisinopril 40 mg p.o. daily at home. --Continue monitor blood pressure closely off of antihypertensives  Hyperlipidemia --Hold home pravastatin until mental status improves  Hypothermia: Resolved Patient presented after being found down at home with a temperature 93 F.  Likely related to environmental factors as homeless cold reportedly.  Patient was rewarmed with use of a Bair hugger in the intensive care unit with normalization of her temperature.  Weakness/debility/deconditioning: --Will need PT/OT evaluation once confusion improved   DVT prophylaxis:  SCDs Start: 06/01/2021 1144   Code Status: Full Code Family Communication: No family present at bedside  this morning  Disposition Plan:  Level of care: Stepdown Status is: Inpatient  Remains inpatient appropriate because: Continued confusion, weakness, needs further monitoring of hemoglobin to ensure stability; likely will need SNF placement    Consultants:  Nephrology General surgery Gastroenterology Palliative care  Procedures:  EGD 12/20; Dr. Allegra Lai Nuclear medicine GI bleeding scan  Antimicrobials:  None    Subjective: Patient seen examined at bedside, resting comfortably.  Pulled out IV overnight now in mitts.  Remains confused, little verbal interaction.  No family present.  Hemoglobin stable.  Continues with soft blood pressures; likely related to poor oral intake.  Remains on IV fluids.  Sodium up to 130 this morning.  Unable to obtain any further ROS due to her current mental status.  No acute concerns this morning per RN.  Objective: Vitals:   05/14/21 0600 05/14/21 0700 05/14/21 0800 05/14/21 0900  BP: (!) 92/57 102/66  (!) 75/52  Pulse: 91 94 87 81  Resp: 13 13 (!) 8 (!) 8  Temp:    98 F (36.7 C)  TempSrc:    Oral  SpO2: 96% 94% 93% 93%  Weight:      Height:        Intake/Output Summary (Last 24 hours) at 05/14/2021 1322 Last data filed at 05/14/2021 0902 Gross per 24 hour  Intake 2186.5 ml  Output 595 ml  Net 1591.5 ml   Filed Weights   05/13/2021 0500 05/13/21 0500 05/14/21 0500  Weight: 88 kg 87.5 kg 93 kg    Examination:  General exam: Appears calm and comfortable; sleeping, confused Respiratory system: Clear to auscultation. Respiratory effort normal.  On room air Cardiovascular system: S1 & S2 heard, RRR. No JVD, murmurs, rubs, gallops or clicks. No pedal edema. Gastrointestinal system: Abdomen is nondistended, soft and nontender. No organomegaly or masses felt. Normal bowel sounds heard. Central nervous system: Alert, not oriented, moves all extremities independently Extremities: Moving all extremities independently Skin: No rashes, lesions  or ulcers Psychiatry: Unable to assess due to current mental status    Data Reviewed: I have personally reviewed following labs and imaging studies  CBC: Recent Labs  Lab 06-01-21 0928 05/11/21 0331 04/23/2021 0246 04/29/2021 1240 05/01/2021 1807 05/13/21 0350 05/13/21 1244 05/14/21 0400 05/14/21 0943  WBC 16.8* 17.0* 13.7*  --   --  17.9*  --  13.4*  --   NEUTROABS 14.4*  --   --   --   --   --   --   --   --   HGB 8.2* 7.1* 6.2*   < > 8.0* 6.8* 9.0* 8.5* 8.5*  HCT 22.2* 19.2* 17.7*   < > 22.0* 18.4* 24.7* 23.8* 24.0*  MCV 100.9* 101.6* 104.7*  --   --  92.5  --  94.8  --   PLT 152 143* 150  --   --  133*  --  125*  --    < > = values in this interval not displayed.   Basic Metabolic Panel: Recent Labs  Lab 06-01-2021 1126 2021-06-01 1225 05/11/21 0331 05/11/21 0656 05/03/2021 0246 05/18/2021 0630 05/13/21 0320 05/13/21 0639 05/13/21 1244 05/13/21 1657 05/14/21 0104 05/14/21 0400 05/14/21 0943  NA 114*   < > 118*   < > 124*   < > 129*   < > 129* 129* 130* 130* 130*  K 3.7  --  3.6  --  3.3*  --  3.7  --   --   --   --  3.7  --   CL 83*  --  90*  --  97*  --  105  --   --   --   --  105  --   CO2 21*  --  17*  --  22  --  20*  --   --   --   --  19*  --   GLUCOSE 101*  --  82  --  123*  --  109*  --   --   --   --  100*  --   BUN 23  --  28*  --  36*  --  42*  --   --   --   --  39*  --   CREATININE 1.05*  --  1.35*  --  1.31*  --  1.00  --   --   --   --  0.98  --   CALCIUM 8.5*  --  8.2*  --  8.5*  --  8.3*  --   --   --   --  8.4*  --   MG  --   --  1.5*  --  2.0  --  1.7  --   --   --   --   --   --   PHOS  --   --  3.4  --   --   --  2.4*  --   --   --   --   --   --    < > = values in this interval not displayed.   GFR: Estimated Creatinine Clearance: 47.8 mL/min (by C-G formula based on SCr of 0.98 mg/dL). Liver Function Tests: Recent Labs  Lab 05/07/2021 0928  AST 117*  ALT 38  ALKPHOS 113  BILITOT 3.3*  PROT 6.1*  ALBUMIN 3.4*   No results for input(s):  LIPASE, AMYLASE in the last 168 hours. No results for input(s): AMMONIA in the last 168 hours. Coagulation Profile: Recent Labs  Lab 05/03/2021 0928 05/13/21 0320  INR 1.4* 1.1   Cardiac Enzymes: Recent Labs  Lab 05/02/2021 0928  CKTOTAL 1,181*   BNP (last 3 results) No results for input(s): PROBNP in the last 8760 hours. HbA1C: No results for input(s): HGBA1C in the last 72 hours. CBG: Recent Labs  Lab 05/09/2021 0923 05/09/2021 1811 05/06/2021 1951 05/20/2021 2328 06/07/2021 1631  GLUCAP 100* 80 82 85 103*   Lipid Profile: No results for input(s): CHOL, HDL, LDLCALC, TRIG, CHOLHDL, LDLDIRECT in the last 72 hours. Thyroid Function Tests: No results for input(s): TSH, T4TOTAL, FREET4, T3FREE, THYROIDAB in the last 72 hours. Anemia Panel: Recent Labs    2021/06/07 0630 07-Jun-2021 1807  VITAMINB12  --  1,346*  FOLATE 4.5*  --   FERRITIN 252  --   TIBC 235*  --   IRON 26*  --    Sepsis Labs: Recent Labs  Lab 04/26/2021 0928 05/11/2021 1133 05/13/2021 1203  PROCALCITON  --   --  0.16  LATICACIDVEN 3.6* 2.8*  --     Recent Results (from the past 240 hour(s))  Urine Culture     Status: None   Collection Time: 04/26/2021  9:26 AM   Specimen: Urine, Random  Result Value Ref Range Status   Specimen Description   Final    URINE, RANDOM Performed at [facility], [address]  [place], [place] [address]    Special Requests   Final    NONE Performed at [facility], [address] [place], [place] [address]    Culture   Final    NO GROWTH Performed at [facility], [address] [place], [place] [address]    Report Status 05/11/2021 FINAL  Final  Resp Panel by RT-PCR (Flu A&B, Covid)     Status: None   Collection Time: 05/21/2021  9:33 AM   Specimen: Nasopharyngeal(NP) swabs in vial transport medium  Result Value Ref Range Status   SARS Coronavirus 2 by RT PCR NEGATIVE NEGATIVE Final    Comment: (NOTE) SARS-CoV-2 target nucleic acids are NOT  DETECTED.  The SARS-CoV-2 RNA is generally detectable in upper respiratory specimens during the acute phase of infection. The lowest concentration of SARS-CoV-2 viral copies this assay can detect is 138 copies/mL. A negative result does not preclude SARS-Cov-2 infection and should not be used as the sole basis for treatment or other patient management decisions. A negative result may occur with  improper specimen collection/handling, submission of specimen other than nasopharyngeal swab, presence of viral mutation(s) within the areas targeted by this assay, and inadequate number of viral copies(<138 copies/mL). A negative result must be combined with clinical observations, patient history, and epidemiological information. The expected result is Negative.  Fact Sheet for Patients:  BloggerCourse.com  Fact Sheet for Healthcare Providers:  SeriousBroker.it  This test is no t yet approved or cleared by the [place] FDA and  has been authorized for detection and/or diagnosis of SARS-CoV-2 by FDA under an Emergency Use Authorization (EUA). This EUA will remain  in effect (meaning this test can be used) for the duration of the COVID-19 declaration under Section 564(b)(1) of the Act, 21 U.S.C.section 360bbb-3(b)(1), unless the authorization is terminated  or revoked sooner.       Influenza A by PCR NEGATIVE NEGATIVE Final   Influenza B by PCR NEGATIVE NEGATIVE Final    Comment: (NOTE) The Xpert Xpress SARS-CoV-2/FLU/RSV plus assay is intended as an aid in the diagnosis of influenza from Nasopharyngeal swab specimens and should not be used as a sole basis for treatment. Nasal washings and aspirates are unacceptable for Xpert Xpress SARS-CoV-2/FLU/RSV testing.  Fact Sheet for Patients: BloggerCourse.com  Fact Sheet for Healthcare Providers: SeriousBroker.it  This test is not yet  approved or cleared by the [place] FDA and has been authorized for detection and/or diagnosis of SARS-CoV-2 by FDA under an Emergency Use Authorization (EUA). This EUA will remain in effect (meaning this test can be used) for the duration of the COVID-19 declaration under Section 564(b)(1) of the Act, 21 U.S.C. section 360bbb-3(b)(1), unless the authorization is terminated or revoked.  Performed at [facility], [address] [place], [place] [address]   Blood Culture (routine x 2)     Status: None (Preliminary result)   Collection Time: 04/26/2021  9:34 AM   Specimen: BLOOD  Result Value Ref Range Status   Specimen Description BLOOD RIGHT ANTECUBITAL  Final   Special Requests   Final    BOTTLES DRAWN AEROBIC AND ANAEROBIC Blood Culture results may not be optimal due to an excessive volume of blood received in culture bottles   Culture   Final    NO GROWTH 4 DAYS Performed at [facility], [address] [place], [place] [address]    Report Status PENDING  Incomplete  MRSA Next Gen by PCR, Nasal  Status: None   Collection Time: June 03, 2021  6:16 PM   Specimen: Nasal Mucosa; Nasal Swab  Result Value Ref Range Status   MRSA by PCR Next Gen NOT DETECTED NOT DETECTED Final    Comment: (NOTE) The GeneXpert MRSA Assay (FDA approved for NASAL specimens only), is one component of a comprehensive MRSA colonization surveillance program. It is not intended to diagnose MRSA infection nor to guide or monitor treatment for MRSA infections. Test performance is not FDA approved in patients less than 73 years old. Performed at [facility], [address] [place], [place] [address]   Blood Culture (routine x 2)     Status: None (Preliminary result)   Collection Time: 2021-06-03  6:50 PM   Specimen: BLOOD  Result Value Ref Range Status   Specimen Description BLOOD BLOOD RIGHT HAND  Final   Special Requests   Final    BOTTLES DRAWN AEROBIC ONLY Blood  Culture results may not be optimal due to an inadequate volume of blood received in culture bottles   Culture   Final    NO GROWTH 4 DAYS Performed at [facility], [address] [place], [place] [address]    Report Status PENDING  Incomplete         Radiology Studies: No results found.      Scheduled Meds:  sodium chloride   Intravenous Once   chlorhexidine  15 mL Mouth Rinse BID   Chlorhexidine Gluconate Cloth  6 each Topical Q0600   feeding supplement  237 mL Oral TID BM   folic acid  1 mg Oral Daily   mouth rinse  15 mL Mouth Rinse q12n4p   multivitamin with minerals  1 tablet Oral Daily   [START ON 05/16/2021] pantoprazole  40 mg Intravenous Q12H   polyethylene glycol  17 g Oral Daily   Continuous Infusions:  sodium chloride Stopped (05/13/21 1134)   sodium chloride 50 mL/hr at 05/14/21 0800   pantoprazole 8 mg/hr (05/14/21 1128)     LOS: 4 days    Time spent: 42 minutes spent on chart review, discussion with nursing staff, consultants, updating family and interview/physical exam; more than 50% of that time was spent in counseling and/or coordination of care.    Alvira Philips [place], DO [facility] Available via Epic secure chat 7am-7pm After these hours, please refer to coverage provider listed on [email or web address] 05/14/2021, 1:22 PM

## 2021-05-14 NOTE — Progress Notes (Signed)
Patient is alert and oriented to self. She states that it is time for her to go home and take her medication. She was informed that it was 0230 at the time and she was unable to go home. Patient then proceeded to rip her IV out of her left wrist which is the second IV that she has done this to. Mitts were applied to patients hands. Will continue to monitor.

## 2021-05-14 NOTE — Progress Notes (Signed)
BP (!) 92/57    Pulse 91    Temp 98.1 F (36.7 C) (Oral)    Resp 13    Ht 5\' 2"  (1.575 m)    Wt 93 kg    SpO2 96%    BMI 37.50 kg/m    ICU needs resolving Now SD status Not on pressors not on vent  PCCM will  follow along as needed

## 2021-05-15 DIAGNOSIS — Z515 Encounter for palliative care: Secondary | ICD-10-CM | POA: Diagnosis not present

## 2021-05-15 DIAGNOSIS — Z7189 Other specified counseling: Secondary | ICD-10-CM | POA: Diagnosis not present

## 2021-05-15 DIAGNOSIS — T68XXXA Hypothermia, initial encounter: Secondary | ICD-10-CM | POA: Diagnosis not present

## 2021-05-15 DIAGNOSIS — K922 Gastrointestinal hemorrhage, unspecified: Secondary | ICD-10-CM | POA: Diagnosis not present

## 2021-05-15 LAB — COMPREHENSIVE METABOLIC PANEL
ALT: 30 U/L (ref 0–44)
AST: 33 U/L (ref 15–41)
Albumin: 2.9 g/dL — ABNORMAL LOW (ref 3.5–5.0)
Alkaline Phosphatase: 98 U/L (ref 38–126)
Anion gap: 4 — ABNORMAL LOW (ref 5–15)
BUN: 33 mg/dL — ABNORMAL HIGH (ref 8–23)
CO2: 20 mmol/L — ABNORMAL LOW (ref 22–32)
Calcium: 8.3 mg/dL — ABNORMAL LOW (ref 8.9–10.3)
Chloride: 107 mmol/L (ref 98–111)
Creatinine, Ser: 1.08 mg/dL — ABNORMAL HIGH (ref 0.44–1.00)
GFR, Estimated: 52 mL/min — ABNORMAL LOW (ref >60)
Glucose, Bld: 130 mg/dL — ABNORMAL HIGH (ref 70–99)
Potassium: 3.4 mmol/L — ABNORMAL LOW (ref 3.5–5.1)
Sodium: 131 mmol/L — ABNORMAL LOW (ref 135–145)
Total Bilirubin: 2.2 mg/dL — ABNORMAL HIGH (ref 0.3–1.2)
Total Protein: 5.5 g/dL — ABNORMAL LOW (ref 6.5–8.1)

## 2021-05-15 LAB — LACTIC ACID, PLASMA: Lactic Acid, Venous: 1 mmol/L (ref 0.5–1.9)

## 2021-05-15 LAB — CULTURE, BLOOD (ROUTINE X 2)
Culture: NO GROWTH
Culture: NO GROWTH

## 2021-05-15 LAB — BLOOD GAS, VENOUS
Acid-base deficit: 5 mmol/L — ABNORMAL HIGH (ref 0.0–2.0)
Bicarbonate: 19.5 mmol/L — ABNORMAL LOW (ref 20.0–28.0)
O2 Saturation: 97.2 %
Patient temperature: 37
pCO2, Ven: 33 mmHg — ABNORMAL LOW (ref 44.0–60.0)
pH, Ven: 7.38 (ref 7.250–7.430)
pO2, Ven: 95 mmHg — ABNORMAL HIGH (ref 32.0–45.0)

## 2021-05-15 LAB — CBC
HCT: 26 % — ABNORMAL LOW (ref 36.0–46.0)
Hemoglobin: 8.9 g/dL — ABNORMAL LOW (ref 12.0–15.0)
MCH: 34 pg (ref 26.0–34.0)
MCHC: 34.2 g/dL (ref 30.0–36.0)
MCV: 99.2 fL (ref 80.0–100.0)
Platelets: 126 10*3/uL — ABNORMAL LOW (ref 150–400)
RBC: 2.62 MIL/uL — ABNORMAL LOW (ref 3.87–5.11)
RDW: 20.4 % — ABNORMAL HIGH (ref 11.5–15.5)
WBC: 11.7 10*3/uL — ABNORMAL HIGH (ref 4.0–10.5)
nRBC: 0.5 % — ABNORMAL HIGH (ref 0.0–0.2)

## 2021-05-15 LAB — AMMONIA: Ammonia: 52 umol/L — ABNORMAL HIGH (ref 9–35)

## 2021-05-15 LAB — MAGNESIUM: Magnesium: 1.6 mg/dL — ABNORMAL LOW (ref 1.7–2.4)

## 2021-05-15 LAB — SODIUM: Sodium: 132 mmol/L — ABNORMAL LOW (ref 135–145)

## 2021-05-15 MED ORDER — POTASSIUM CHLORIDE CRYS ER 20 MEQ PO TBCR
30.0000 meq | EXTENDED_RELEASE_TABLET | ORAL | Status: AC
  Administered 2021-05-15 (×2): 30 meq via ORAL
  Filled 2021-05-15 (×2): qty 2

## 2021-05-15 MED ORDER — MAGNESIUM SULFATE 2 GM/50ML IV SOLN
2.0000 g | Freq: Once | INTRAVENOUS | Status: AC
  Administered 2021-05-15: 08:00:00 2 g via INTRAVENOUS
  Filled 2021-05-15: qty 50

## 2021-05-15 NOTE — Progress Notes (Signed)
Patient alert and oriented to self,place, and situation. Patient occasional needs reoriented to time/situation. HR in NSR/ST on room air. Foley removed per MD orders at 1330. Void within 6 hours/bladder scan per MD. Reported to RN in report for transfer. 375 out with foley.  Mepiplex placed on skin tear on right arm.

## 2021-05-15 NOTE — Evaluation (Signed)
Physical Therapy Evaluation Patient Details Name: Angela Sullivan MRN: [id number] DOB: 1940-12-04 Today's Date: 05/15/2021  History of Present Illness  Declan Adamson is an 80yoF who comes [facility] on 05/17/2021 after unwitnessed fall, assisted up by EMS, refused transport to ED, then fell again later in day. Pt down for sustained time in cold environment, 93degrees upon arrival, hyponatremia 114. Bloody stools -> EGD c duodenal ulcers. CK levels on arrival 1185. PMH: HLD, HTN.  Clinical Impression  Pt admitted with above diagnosis. Pt currently with functional limitations due to the deficits listed below (see "PT Problem List"). Patient agreeable to PT evaluation. Pt at EOB, finishing OT evaluation. Patient provides detailed description of PLOF and home environment. MinA needed to stand with RW, AMB slow and weak with RVR elevated into 120s-130s. Pt would not be able to return to home and be safe or independent in her current state, discussed using STR as a means to eventually get back to home. Patient's assessment this date reveals the patient requires an additional person present for safety and/or physical assistance to complete their typical ADL. At baseline, the patient is able to perform ADL with modified independence. Patient will benefit from skilled PT intervention to maximize independence and safety in mobility required for basic ADL performance at discharge.        Recommendations for follow up therapy are one component of a multi-disciplinary discharge planning process, led by the attending physician.  Recommendations may be updated based on patient status, additional functional criteria and insurance authorization.  Follow Up Recommendations Skilled nursing-short term rehab (<3 hours/day)    Assistance Recommended at Discharge Intermittent Supervision/Assistance  Functional Status Assessment Patient has had a recent decline in their functional status and demonstrates the ability to make  significant improvements in function in a reasonable and predictable amount of time.  Equipment Recommendations  Rolling walker (2 wheels)    Recommendations for Other Services       Precautions / Restrictions Precautions Precautions: Fall Restrictions Weight Bearing Restrictions: No      Mobility  Bed Mobility               General bed mobility comments: already at EOB    Transfers Overall transfer level: Needs assistance Equipment used: Rolling walker (2 wheels) Transfers: Sit to/from Stand Sit to Stand: Min assist           General transfer comment: verbal cues for technique    Ambulation/Gait Ambulation/Gait assistance: Min guard Gait Distance (Feet): 15 Feet Assistive device: Rolling walker (2 wheels) Gait Pattern/deviations: Step-to pattern       General Gait Details: around bed only, appears slow and weak, increased RR and DOE. HR in 120s-130s in RVR.  Stairs            Wheelchair Mobility    Modified Rankin (Stroke Patients Only)       Balance Overall balance assessment: History of Falls                                           Pertinent Vitals/Pain Pain Assessment: No/denies pain    Home Living Family/patient expects to be discharged to:: Private residence Living Arrangements: Alone Available Help at Discharge: Friend(s) Type of Home: House Home Access: Stairs to enter;Level entry (level entry off patio per pt) Entrance Stairs-Rails: None Entrance Stairs-Number of Steps: 3   Home Layout: One level  Additional Comments: walking stick    Prior Function Prior Level of Function : Independent/Modified Independent;Driving             Mobility Comments: modI baseline ADLs Comments: modI baseline     Hand Dominance        Extremity/Trunk Assessment                Communication      Cognition Arousal/Alertness: Awake/alert Behavior During Therapy: WFL for tasks  assessed/performed Overall Cognitive Status: Within Functional Limits for tasks assessed                                          General Comments      Exercises     Assessment/Plan    PT Assessment Patient needs continued PT services  PT Problem List Decreased strength;Decreased activity tolerance;Decreased balance;Decreased mobility       PT Treatment Interventions DME instruction;Balance training;Gait training;Stair training;Functional mobility training;Therapeutic activities;Therapeutic exercise;Patient/family education    PT Goals (Current goals can be found in the Care Plan section)  Acute Rehab PT Goals Patient Stated Goal: regain strength and return to living alone independently PT Goal Formulation: With patient Time For Goal Achievement: 05/29/21 Potential to Achieve Goals: Fair    Frequency Min 2X/week   Barriers to discharge Inaccessible home environment      Co-evaluation               AM-PAC PT "6 Clicks" Mobility  Outcome Measure Help needed turning from your back to your side while in a flat bed without using bedrails?: A Lot Help needed moving from lying on your back to sitting on the side of a flat bed without using bedrails?: A Lot Help needed moving to and from a bed to a chair (including a wheelchair)?: A Lot Help needed standing up from a chair using your arms (e.g., wheelchair or bedside chair)?: A Lot Help needed to walk in hospital room?: A Little Help needed climbing 3-5 steps with a railing? : A Lot 6 Click Score: 13    End of Session Equipment Utilized During Treatment: Gait belt Activity Tolerance: Patient tolerated treatment well;Patient limited by fatigue Patient left: in chair;with call bell/phone within reach Nurse Communication: Mobility status PT Visit Diagnosis: Unsteadiness on feet (R26.81);Difficulty in walking, not elsewhere classified (R26.2);Other abnormalities of gait and mobility (R26.89);Repeated falls  (R29.6);Muscle weakness (generalized) (M62.81)    Time: 8921-1941 PT Time Calculation (min) (ACUTE ONLY): 10 min   Charges:   PT Evaluation $PT Eval Moderate Complexity: 1 Mod         12:40 PM, 05/15/21 Rosamaria Lints, PT, DPT Physical Therapist - [facility]  [phone] (ASCOM)    Vincient Vanaman C 05/15/2021, 12:36 PM

## 2021-05-15 NOTE — Progress Notes (Addendum)
Handoff report given from previous RN at 1900. Pt talkative with staff and more interactive with environment. Pt in NAD and denies pain. No impulsivity noted in regards to fall risk status. Pt's right arm began to weep around 01:20 and affected appendage was wrapped in kerlex with abd pads placed on noted leaking areas with an ace wrap for securement.   Pt alert to self and intermittent situation. Pt still has slurred speech but responds to questions appropriately. Notably restless throughout the night with minimal periods of continuous rest.   Pt stating "I hope I get to go home tomorrow". Improved appetite and willingness to eat noted

## 2021-05-15 NOTE — Progress Notes (Signed)
IV infiltrated. IV team consult placed. Perlie Mayo, RN

## 2021-05-15 NOTE — TOC Progression Note (Addendum)
Transition of Care [facility]) - Progression Note    Patient Details  Name: Angela Sullivan MRN: [id number] Date of Birth: 01/06/1941  [facility]) CM/SW Contact  Liliana Cline, LCSW Phone Number: 05/15/2021, 2:56 PM  Clinical Narrative:  OT and PT rec SNF. Per chart review patient is disoriented. Attempted call to friend Vikki Ports. Left a VM requesting a return call.    Expected Discharge Plan: Skilled Nursing Facility Barriers to Discharge: Continued Medical Work up  Expected Discharge Plan and Services Expected Discharge Plan: Skilled Nursing Facility   Discharge Planning Services: CM Consult Post Acute Care Choice: Skilled Nursing Facility Living arrangements for the past 2 months: Single Family Home                 DME Arranged: N/A DME Agency: NA                   Social Determinants of Health (SDOH) Interventions    Readmission Risk Interventions No flowsheet data found.

## 2021-05-15 NOTE — Progress Notes (Signed)
[facility] SURGICAL PROGRESS NOTE (cpt [phone])  Hospital Day(s): 5.   Interval History: Patient seen and examined, no acute events or new complaints overnight. Patient more alert and appropriate this morning,but still not overly reliable as historian. No abdominal pain, nausea, emesis. Hgb this morning is 8.9 (from 8.5 yesterday). Leukocytosis present on admission resolving as well; down to 11.7K. Renal function remains near baseline; scr - 1.08; UO - 800 ccs + unmeasured. Hyponatremia is improved to 131. She was advanced to soft diet; tolerating well. No further bloody BMs  Review of Systems:  Unable to preform reliably secondary to mental acuity/poor historian  Vital signs in last 24 hours: [min-max] current  Temp:  [97 F (36.1 C)-98.6 F (37 C)] 97 F (36.1 C) (12/23 0200) Pulse Rate:  [79-140] 96 (12/23 0200) Resp:  [8-16] 15 (12/23 0200) BP: (75-140)/(46-78) 131/59 (12/23 0200) SpO2:  [86 %-100 %] 100 % (12/23 0200) Weight:  [91.2 kg] 91.2 kg (12/23 0300)     Height: 5\' 2"  (157.5 cm) Weight: 91.2 kg BMI (Calculated): 36.76   Intake/Output last 2 shifts:  12/22 0701 - 12/23 0700 In: 2169.4 [P.O.:78; I.V.:2091.4] Out: 800 [Urine:800]   Physical Exam:  Constitutional: More alert and appropriate this morning, NAD HENT: normocephalic without obvious abnormality  Respiratory: breathing non-labored at rest Cardiovascular: regular rate and sinus rhythm  Gastrointestinal: Abdomen is obese, soft, she does not appear tender, non-distended, no rebound/guarding, she does not appear peritonitic. She does have a previous open cholecystectomy scar Genitourinary: Foley in place Musculoskeletal: Safety mittens in place   Labs:  CBC Latest Ref Rng & Units 05/15/2021 05/14/2021 05/14/2021  WBC 4.0 - 10.5 K/uL 11.7(H) - 13.4(H)  Hemoglobin 12.0 - 15.0 g/dL 8.9(L) 8.5(L) 8.5(L)  Hematocrit 36.0 - 46.0 % 26.0(L) 24.0(L) 23.8(L)  Platelets 150 - 400 K/uL 126(L) - 125(L)    CMP Latest Ref Rng & Units 05/15/2021 05/15/2021 05/14/2021  Glucose 70 - 99 mg/dL 130(H) - -  BUN 8 - 23 mg/dL 33(H) - -  Creatinine 0.44 - 1.00 mg/dL 1.08(H) - -  Sodium 135 - 145 mmol/L 131(L) 132(L) 132(L)  Potassium 3.5 - 5.1 mmol/L 3.4(L) - -  Chloride 98 - 111 mmol/L 107 - -  CO2 22 - 32 mmol/L 20(L) - -  Calcium 8.9 - 10.3 mg/dL 8.3(L) - -  Total Protein 6.5 - 8.1 g/dL 5.5(L) - -  Total Bilirubin 0.3 - 1.2 mg/dL 2.2(H) - -  Alkaline Phos 38 - 126 U/L 98 - -  AST 15 - 41 U/L 33 - -  ALT 0 - 44 U/L 30 - -     Imaging studies: No new pertinent imaging studies   Assessment/Plan: (ICD-10's: K92.2) 80 y.o. female presenting after being found down with hypothermia, hyponatremia to 114 and admitted to ICU who subsequently was found to have BRBPR concerning for GI bleed, duodenal source, s/p EGD with clipping and epinephrine injection for hemostasis on 12/20   - Hgb has actually improved this morning and she continues to remain hemodynamically stable. No evidence of re-bleeding at this time. We can continue to forgo any surgical intervention. She is certainly a sub-optimal candidate for surgical intervention and would carry a high morbidity with this.    - Appreciate GI assistance; EGD reviewed - Vascular surgery on board as well; no indications for embolization currently   - Agree with PPI; initiated yesterday (12/20); switch to IV BID - Monitor abdominal examination; on-going bowel function - Monitor H&H; improved; transfuse <  7  - Pain control prn; antiemetics - Further management per primary service  - General surgery will sign off for now given improvement in Hgb and no evidence of re-bleeding x72 hours. We will follow peripherally and be readily available if needed.   All of the above findings and recommendations were discussed with the medical team and patient's bedside RN.   -- Lynden Oxford, PA-C [facility] 05/15/2021, 7:49 AM [phone] M-F:  7am - 4pm

## 2021-05-15 NOTE — Progress Notes (Signed)
Palliative: [name], Angela Sullivan, is lying quietly in bed.  She greets me, making and mostly keeping eye contact.  She is alert, oriented to person, place, situation.  She is after a short period of time and able to tell me the month.  Nursing staff and Occupational Therapy are at bedside attending to needs.   I asked how she is doing and she tells me that she feels better.  I attempt to reassure [name] that she is getting better, and that we are going to care for her.  We briefly talked about her acute illness and the treatment plan.  She has no questions at this time.  I ask who would make healthcare choices if she were unable.  She tells me that her friend, Angela Sullivan, would be her healthcare surrogate even for life and death decisions.  I share that I will reach out to [facility].  We talked about CODE STATUS.  At this point, [name] states that she she would not want life support.  DNR order placed.  Call to friend, Angela Sullivan.  We talked about [name]'s improvements.  I share that she has qualified for short-term rehab.  We talked about healthcare power of attorney and CODE STATUS discussions with [name].  We talked about outpatient palliative services.  Friend/healthcare surrogate, Angela Sullivan, should be present for any outpatient palliative discussions as she is [name]'s healthcare surrogate.  Conference with attending, bedside nursing staff, transition of care team related to patient condition, needs, goals of care, CODE STATUS change, disposition.  I have not discussed outpatient palliative services with [name].  I will ask inpatient palliative provider to see her next week.  If she discharges before being seen, please refer to outpatient palliative.  Please include Angela Sullivan who is her healthcare surrogate in outpatient palliative visits.  Plan: At this point continue to treat the treatable but no CPR or intubation.  Qualified for short-term rehab.  Would  benefit from outpatient palliative services although this was not discussed today.  35 minutes Lillia Carmel, NP Palliative medicine team Team phone [phone] Greater than 50% of this time was spent counseling and coordinating care related to the above assessment and plan.

## 2021-05-15 NOTE — Progress Notes (Signed)
PROGRESS NOTE    Angela Sullivan  ACZ:[id number] DOB: May 24, 1941 DOA: 04/24/2021 PCP: Kandyce Rud, MD    Brief Narrative:  Angela Sullivan is an 80 year old female with past medical history significant for essential hypertension, hyperlipidemia who presented to [facility] ED on 12/18 after being found down at home.  Upon evaluation in ED patient was noted to be hypothermic with severe hyponatremia, confusion, and acute renal failure.  Additionally patient was noted to have bloody stools and underwent EGD and was found to have duodenal ulcers with visible vessels s/p injection and clipping.  During her ICU stay, renal failure improved as well as her sodium level.  Palliative care has been consulted as patient remains confused.  Patient was transferred to hospitalist service on 05/14/2021.   Assessment & Plan:   Principal Problem:   Hypothermia Active Problems:   Hyponatremia   High cholesterol   Hypertension   Fall at home, initial encounter   New onset atrial fibrillation (HCC)   Elevated troponin   Abnormal LFTs   Rhabdomyolysis   Hypokalemia   SIRS (systemic inflammatory response syndrome) (HCC)   Pressure injury of skin   Upper GI bleed secondary to duodenal ulcers Patient was noted to have drop in hemoglobin 6.2.  Nuclear medicine GI bleeding scan with active bleeding descending duodenum.  Patient on indomethacin at home.  Gastroenterology was consulted and patient underwent EGD on 05/27/2021 with findings of several duodenal ulcers 1 with visible vessel s/p epinephrine injection and clipping.  Seen by general surgery, poor surgical candidate. --Hgb 8.2>7.1>6.2>8.0>6.8>9.0>8.5>8.9; stable --s/p  2u pRBC on 12/20 and 1u pRBC on 12/21 --Continue Protonix drip for 72 hours followed by Protonix 40 mg IV q12h --CBC daily --Avoidance of NSAIDs --Closely monitor bowel movements --If rebleeds may need repeat EGD versus consideration of embolization  Acute metabolic encephalopathy:  Improving Patient was found to be confused on arrival, likely related to severe hyponatremia; which is improving. --Supportive care  Severe hyponatremia Etiology likely secondary to dehydration from hypovolemic hyponatremia in the setting of poor oral intake and being found down for unknown known amount of time at home.  Patient was in ID confused on arrival with a sodium level of 114.  Nephrology was consulted and recommended IV fluid hydration. --Na 114>>>130>131 --Continue NS at 50 mL/hr --BMP daily  Hypokalemia --Replete potassium today --Follow electrolytes daily  Hypomagnesemia --Replete magnesium today --Repeat magnesium level in a.m.  Acute renal failure: Resolved Lactic acidosis Creatinine elevated to the 1.35, with lactic acid elevated to 3.6.  Likely secondary to prerenal azotemia from dehydration.  Resolved with IV fluid hydration. --Cr 1.35>>0.98 --BMP daily; repeat lactic acid in a.m.  Folate deficiency --Folic acid 1 mg p.o. daily  Rhabdomyolysis CK level on admission 1185, unclear amount of downtime at home.  Supported with IV fluid hydration.  Hypotension Hx essential hypertension Patient initially required vasopressors with Levophed while under PCCM care in the ICU.  Vasopressors has been titrated off.  Likely etiology from acute blood loss anemia from GI bleed as above and severe dehydration from unclear amount of downtime.  On lisinopril 40 mg p.o. daily at home. --Continue monitor blood pressure closely off of antihypertensives  Hyperlipidemia --Hold home pravastatin until mental status improves  Hypothermia: Resolved Patient presented after being found down at home with a temperature 93 F.  Likely related to environmental factors as homeless cold reportedly.  Patient was rewarmed with use of a Bair hugger in the intensive care unit with normalization of her temperature.  Weakness/debility/deconditioning: --PT/OT evaluation; likely will need SNF  placement   DVT prophylaxis: SCDs Start: 05/09/2021 1144   Code Status: Full Code Family Communication: No family present at bedside this morning  Disposition Plan:  Level of care: Telemetry Medical Status is: Inpatient  Remains inpatient appropriate because: Continued confusion, weakness, needs further monitoring of hemoglobin to ensure stability; likely will need SNF placement    Consultants:  Nephrology General surgery Gastroenterology Palliative care  Procedures:  EGD 12/20; Dr. Allegra Lai Nuclear medicine GI bleeding scan  Antimicrobials:  None    Subjective: Patient seen examined at bedside, resting comfortably.  Patient more alert and awake today.  Hemoglobin stable, sodium level continues to trend up.  Stable for transfer to telemetry unit.  No specific complaints or concerns per patient this morning.  No acute concerns this morning per RN.  Objective: Vitals:   05/15/21 0200 05/15/21 0300 05/15/21 0730 05/15/21 0800  BP: (!) 131/59  134/61 (!) 143/73  Pulse: 96   (!) 102  Resp: Temp: (!) 97 F (36.1 C)   97.8 F (36.6 C)  TempSrc: Axillary   Axillary  SpO2: 100%   98%  Weight:  91.2 kg    Height:        Intake/Output Summary (Last 24 hours) at 05/15/2021 1256 Last data filed at 05/15/2021 0500 Gross per 24 hour  Intake 1122.88 ml  Output 625 ml  Net 497.88 ml   Filed Weights   05/13/21 0500 05/14/21 0500 05/15/21 0300  Weight: 87.5 kg 93 kg 91.2 kg    Examination:  General exam: Appears calm and comfortable; chronically ill in appearance Respiratory system: Clear to auscultation. Respiratory effort normal.  On room air Cardiovascular system: S1 & S2 heard, RRR. No JVD, murmurs, rubs, gallops or clicks. No pedal edema. Gastrointestinal system: Abdomen is nondistended, soft and nontender. No organomegaly or masses felt. Normal bowel sounds heard. Central nervous system: Alert, not oriented, moves all extremities independently Extremities:  Moving all extremities independently Skin: No rashes, lesions or ulcers Psychiatry: Judgment and insight appear poor.  Mood and affect normal.    Data Reviewed: I have personally reviewed following labs and imaging studies  CBC: Recent Labs  Lab 05/04/2021 0928 05/11/21 0331 2021/06/06 0246 06-Jun-2021 1240 05/13/21 0350 05/13/21 1244 05/14/21 0400 05/14/21 0943 05/15/21 0342  WBC 16.8* 17.0* 13.7*  --  17.9*  --  13.4*  --  11.7*  NEUTROABS 14.4*  --   --   --   --   --   --   --   --   HGB 8.2* 7.1* 6.2*   < > 6.8* 9.0* 8.5* 8.5* 8.9*  HCT 22.2* 19.2* 17.7*   < > 18.4* 24.7* 23.8* 24.0* 26.0*  MCV 100.9* 101.6* 104.7*  --  92.5  --  94.8  --  99.2  PLT 152 143* 150  --  133*  --  125*  --  126*   < > = values in this interval not displayed.   Basic Metabolic Panel: Recent Labs  Lab 05/11/21 0331 05/11/21 0656 06-06-21 0246 06-Jun-2021 0630 05/13/21 0320 05/13/21 0639 05/14/21 0400 05/14/21 0943 05/14/21 1325 05/14/21 1707 05/14/21 2123 05/15/21 0051 05/15/21 0342  NA 118*   < > 124*   < > 129*   < > 130*   < > 132* 132* 132* 132* 131*  K 3.6  --  3.3*  --  3.7  --  3.7  --   --   --   --   --  3.4*  CL 90*  --  97*  --  105  --  105  --   --   --   --   --  107  CO2 17*  --  22  --  20*  --  19*  --   --   --   --   --  20*  GLUCOSE 82  --  123*  --  109*  --  100*  --   --   --   --   --  130*  BUN 28*  --  36*  --  42*  --  39*  --   --   --   --   --  33*  CREATININE 1.35*  --  1.31*  --  1.00  --  0.98  --   --   --   --   --  1.08*  CALCIUM 8.2*  --  8.5*  --  8.3*  --  8.4*  --   --   --   --   --  8.3*  MG 1.5*  --  2.0  --  1.7  --   --   --   --   --   --   --  1.6*  PHOS 3.4  --   --   --  2.4*  --   --   --   --   --   --   --   --    < > = values in this interval not displayed.   GFR: Estimated Creatinine Clearance: 42.9 mL/min (A) (by C-G formula based on SCr of 1.08 mg/dL (H)). Liver Function Tests: Recent Labs  Lab 05/01/2021 0928 05/15/21 0342  AST  117* 33  ALT 38 30  ALKPHOS 113 98  BILITOT 3.3* 2.2*  PROT 6.1* 5.5*  ALBUMIN 3.4* 2.9*   No results for input(s): LIPASE, AMYLASE in the last 168 hours. Recent Labs  Lab 05/15/21 0342  AMMONIA 52*   Coagulation Profile: Recent Labs  Lab 04/30/2021 0928 05/13/21 0320  INR 1.4* 1.1   Cardiac Enzymes: Recent Labs  Lab 04/30/2021 0928  CKTOTAL 1,181*   BNP (last 3 results) No results for input(s): PROBNP in the last 8760 hours. HbA1C: No results for input(s): HGBA1C in the last 72 hours. CBG: Recent Labs  Lab 04/23/2021 0923 04/25/2021 1811 05/09/2021 1951 05/05/2021 2328 05/18/2021 1631  GLUCAP 100* 80 82 85 103*   Lipid Profile: No results for input(s): CHOL, HDL, LDLCALC, TRIG, CHOLHDL, LDLDIRECT in the last 72 hours. Thyroid Function Tests: No results for input(s): TSH, T4TOTAL, FREET4, T3FREE, THYROIDAB in the last 72 hours. Anemia Panel: Recent Labs    05-18-2021 1807  VITAMINB12 1,346*   Sepsis Labs: Recent Labs  Lab 04/29/2021 0928 05/05/2021 1133 05/02/2021 1203 05/15/21 0342  PROCALCITON  --   --  0.16  --   LATICACIDVEN 3.6* 2.8*  --  1.0    Recent Results (from the past 240 hour(s))  Urine Culture     Status: None   Collection Time: 04/29/2021  9:26 AM   Specimen: Urine, Random  Result Value Ref Range Status   Specimen Description   Final    URINE, RANDOM Performed at [facility], [address], [place], [place] [address]    Special Requests   Final    NONE Performed at [facility], [address], [place], [place] [address]    Culture   Final  NO GROWTH Performed at [facility], [address], [place], [place] [address]    Report Status 05/11/2021 FINAL  Final  Resp Panel by RT-PCR (Flu A&B, Covid)     Status: None   Collection Time: 05/09/2021  9:33 AM   Specimen: Nasopharyngeal(NP) swabs in vial transport medium  Result Value Ref Range Status   SARS Coronavirus 2 by RT PCR NEGATIVE NEGATIVE Final    Comment:  (NOTE) SARS-CoV-2 target nucleic acids are NOT DETECTED.  The SARS-CoV-2 RNA is generally detectable in upper respiratory specimens during the acute phase of infection. The lowest concentration of SARS-CoV-2 viral copies this assay can detect is 138 copies/mL. A negative result does not preclude SARS-Cov-2 infection and should not be used as the sole basis for treatment or other patient management decisions. A negative result may occur with  improper specimen collection/handling, submission of specimen other than nasopharyngeal swab, presence of viral mutation(s) within the areas targeted by this assay, and inadequate number of viral copies(<138 copies/mL). A negative result must be combined with clinical observations, patient history, and epidemiological information. The expected result is Negative.  Fact Sheet for Patients:  BloggerCourse.com  Fact Sheet for Healthcare Providers:  SeriousBroker.it  This test is no t yet approved or cleared by the [place] FDA and  has been authorized for detection and/or diagnosis of SARS-CoV-2 by FDA under an Emergency Use Authorization (EUA). This EUA will remain  in effect (meaning this test can be used) for the duration of the COVID-19 declaration under Section 564(b)(1) of the Act, 21 U.S.C.section 360bbb-3(b)(1), unless the authorization is terminated  or revoked sooner.       Influenza A by PCR NEGATIVE NEGATIVE Final   Influenza B by PCR NEGATIVE NEGATIVE Final    Comment: (NOTE) The Xpert Xpress SARS-CoV-2/FLU/RSV plus assay is intended as an aid in the diagnosis of influenza from Nasopharyngeal swab specimens and should not be used as a sole basis for treatment. Nasal washings and aspirates are unacceptable for Xpert Xpress SARS-CoV-2/FLU/RSV testing.  Fact Sheet for Patients: BloggerCourse.com  Fact Sheet for Healthcare  Providers: SeriousBroker.it  This test is not yet approved or cleared by the [place] FDA and has been authorized for detection and/or diagnosis of SARS-CoV-2 by FDA under an Emergency Use Authorization (EUA). This EUA will remain in effect (meaning this test can be used) for the duration of the COVID-19 declaration under Section 564(b)(1) of the Act, 21 U.S.C. section 360bbb-3(b)(1), unless the authorization is terminated or revoked.  Performed at [facility], [address] [place], [place] [address]   Blood Culture (routine x 2)     Status: None   Collection Time: 05/14/2021  9:34 AM   Specimen: BLOOD  Result Value Ref Range Status   Specimen Description BLOOD RIGHT ANTECUBITAL  Final   Special Requests   Final    BOTTLES DRAWN AEROBIC AND ANAEROBIC Blood Culture results may not be optimal due to an excessive volume of blood received in culture bottles   Culture   Final    NO GROWTH 5 DAYS Performed at [facility], [address], [place], [place] [address]    Report Status 05/15/2021 FINAL  Final  MRSA Next Gen by PCR, Nasal     Status: None   Collection Time: 05/06/2021  6:16 PM   Specimen: Nasal Mucosa; Nasal Swab  Result Value Ref Range Status   MRSA by PCR Next Gen NOT DETECTED NOT DETECTED Final  Comment: (NOTE) The GeneXpert MRSA Assay (FDA approved for NASAL specimens only), is one component of a comprehensive MRSA colonization surveillance program. It is not intended to diagnose MRSA infection nor to guide or monitor treatment for MRSA infections. Test performance is not FDA approved in patients less than 53 years old. Performed at [facility], [address] [place], [place] [address]   Blood Culture (routine x 2)     Status: None   Collection Time: 06-08-2021  6:50 PM   Specimen: BLOOD  Result Value Ref Range Status   Specimen Description BLOOD BLOOD RIGHT HAND  Final   Special Requests   Final     BOTTLES DRAWN AEROBIC ONLY Blood Culture results may not be optimal due to an inadequate volume of blood received in culture bottles   Culture   Final    NO GROWTH 5 DAYS Performed at [facility], [address] [place], [place] [address]    Report Status 05/15/2021 FINAL  Final         Radiology Studies: No results found.      Scheduled Meds:  sodium chloride   Intravenous Once   chlorhexidine  15 mL Mouth Rinse BID   Chlorhexidine Gluconate Cloth  6 each Topical Q0600   feeding supplement  237 mL Oral TID BM   folic acid  1 mg Oral Daily   mouth rinse  15 mL Mouth Rinse q12n4p   multivitamin with minerals  1 tablet Oral Daily   [START ON 05/16/2021] pantoprazole  40 mg Intravenous Q12H   polyethylene glycol  17 g Oral Daily   potassium chloride  30 mEq Oral Q3H   Continuous Infusions:  sodium chloride Stopped (05/13/21 1134)   sodium chloride 50 mL/hr at 05/15/21 1045   pantoprazole 8 mg/hr (05/15/21 0920)     LOS: 5 days    Time spent: 38 minutes spent on chart review, discussion with nursing staff, consultants, updating family and interview/physical exam; more than 50% of that time was spent in counseling and/or coordination of care.    Alvira Philips [place], DO [facility] Available via Epic secure chat 7am-7pm After these hours, please refer to coverage provider listed on [email or web address] 05/15/2021, 12:56 PM

## 2021-05-15 NOTE — Evaluation (Signed)
Occupational Therapy Evaluation Patient Details Name: Angela Sullivan MRN: [id number] DOB: [birth year] Today's Date: 05/15/2021   History of Present Illness Angela Sullivan is an 80yoF who comes to [facility] on 04/23/2021 after unwitnessed fall, assisted up by EMS, refused transport to ED, then fell again later in day. Pt down for sustained time in cold environment, 93degrees upon arrival, hyponatremia 114. Bloody stools -> EGD c duodenal ulcers. CK levels on arrival 1185. PMH: HLD, HTN.   Clinical Impression   Angela Sullivan was seen for OT evaluation this date. Prior to hospital admission, pt was Independent for mobility and ADLs. Pt lives alone. Pt presents to acute OT demonstrating impaired ADL performance and functional mobility 2/2 decreased activity tolerance and functional strength/ROM/balance deficits. Pt currently requires MOD A exit L side of bed. MAX A don B socks seated EOB. MIN A + HHA sit<>stand at EOB and lateral steps along EOB. MAX A pericare in stading, pt reliant on BUE support in standing. Pt would benefit from skilled OT to address noted impairments and functional limitations (see below for any additional details) in order to maximize safety and independence while minimizing falls risk and caregiver burden. Upon hospital discharge, recommend STR to maximize pt safety and return to PLOF.       Recommendations for follow up therapy are one component of a multi-disciplinary discharge planning process, led by the attending physician.  Recommendations may be updated based on patient status, additional functional criteria and insurance authorization.   Follow Up Recommendations  Skilled nursing-short term rehab (<3 hours/day)    Assistance Recommended at Discharge Intermittent Supervision/Assistance  Functional Status Assessment  Patient has had a recent decline in their functional status and demonstrates the ability to make significant improvements in function in a reasonable and predictable  amount of time.  Equipment Recommendations  Other (comment) (defer to next venue of care)    Recommendations for Other Services       Precautions / Restrictions Precautions Precautions: Fall Restrictions Weight Bearing Restrictions: No      Mobility Bed Mobility Overal bed mobility: Needs Assistance Bed Mobility: Supine to Sit     Supine to sit: Mod assist     General bed mobility comments: assist for trunk    Transfers Overall transfer level: Needs assistance Equipment used: Rolling walker (2 wheels) Transfers: Sit to/from Stand Sit to Stand: Min assist           General transfer comment: verbal cues for technique      Balance Overall balance assessment: Needs assistance Sitting-balance support: Feet supported;Bilateral upper extremity supported Sitting balance-Leahy Scale: Fair     Standing balance support: Bilateral upper extremity supported;Reliant on assistive device for balance Standing balance-Leahy Scale: Fair                             ADL either performed or assessed with clinical judgement   ADL Overall ADL's : Needs assistance/impaired                                       General ADL Comments: MAX A don B socks seated EOB. MIN A + RW for ADL t/f. MAX A pericare in stading, pt reliant on BUE support in standing. Requires BUE support in standing, unable to trial standing grooming tasks      Pertinent Vitals/Pain Pain Assessment: No/denies pain  Hand Dominance     Extremity/Trunk Assessment Upper Extremity Assessment Upper Extremity Assessment: Generalized weakness   Lower Extremity Assessment Lower Extremity Assessment: Generalized weakness       Communication Communication Communication: HOH   Cognition Arousal/Alertness: Awake/alert Behavior During Therapy: WFL for tasks assessed/performed Overall Cognitive Status: Impaired/Different from baseline Area of Impairment:  Orientation;Safety/judgement                 Orientation Level: Disoriented to;Time       Safety/Judgement: Decreased awareness of safety;Decreased awareness of deficits           General Comments  SpO2 95% on RA seated EOB, increased WOB noted with exiting bed    Exercises     Shoulder Instructions      Home Living Family/patient expects to be discharged to:: Private residence Living Arrangements: Alone Available Help at Discharge: Friend(s) Type of Home: House Home Access: Stairs to enter;Level entry Secretary/administrator of Steps: 3 Entrance Stairs-Rails: None Home Layout: One level               Home Equipment: None   Additional Comments: walking stick      Prior Functioning/Environment Prior Level of Function : Independent/Modified Independent;Driving             Mobility Comments: modI baseline ADLs Comments: modI baseline        OT Problem List: Decreased strength;Decreased range of motion;Decreased activity tolerance;Impaired balance (sitting and/or standing);Decreased safety awareness;Decreased cognition      OT Treatment/Interventions: Self-care/ADL training;Therapeutic exercise;Energy conservation;DME and/or AE instruction;Therapeutic activities;Patient/family education;Balance training    OT Goals(Current goals can be found in the care plan section) Acute Rehab OT Goals Patient Stated Goal: to feel better OT Goal Formulation: With patient Time For Goal Achievement: 05/29/21 Potential to Achieve Goals: Good ADL Goals Pt Will Perform Grooming: with min guard assist;standing (c LRAD PRN) Pt Will Perform Lower Body Dressing: with min assist;sit to/from stand Pt Will Transfer to Toilet: with modified independence;ambulating;regular height toilet  OT Frequency: Min 2X/week   Barriers to D/C: Decreased caregiver support          Co-evaluation              AM-PAC OT "6 Clicks" Daily Activity     Outcome Measure Help from  another person eating meals?: None Help from another person taking care of personal grooming?: A Little Help from another person toileting, which includes using toliet, bedpan, or urinal?: A Lot Help from another person bathing (including washing, rinsing, drying)?: A Lot Help from another person to put on and taking off regular upper body clothing?: A Little Help from another person to put on and taking off regular lower body clothing?: A Lot 6 Click Score: 16   End of Session    Activity Tolerance: Patient tolerated treatment well Patient left: in bed;with call bell/phone within reach;Other (comment) (PT at bedside)  OT Visit Diagnosis: Unsteadiness on feet (R26.81);Other abnormalities of gait and mobility (R26.89)                Time: 1856-3149 OT Time Calculation (min): 18 min Charges:  OT General Charges $OT Visit: 1 Visit OT Evaluation $OT Eval Moderate Complexity: 1 Mod  Kathie Dike, M.S. OTR/L  05/15/21, 1:09 PM  ascom [phone]

## 2021-05-16 DIAGNOSIS — G9341 Metabolic encephalopathy: Secondary | ICD-10-CM | POA: Diagnosis present

## 2021-05-16 DIAGNOSIS — R7989 Other specified abnormal findings of blood chemistry: Secondary | ICD-10-CM | POA: Diagnosis not present

## 2021-05-16 DIAGNOSIS — K922 Gastrointestinal hemorrhage, unspecified: Secondary | ICD-10-CM | POA: Diagnosis not present

## 2021-05-16 DIAGNOSIS — N179 Acute kidney failure, unspecified: Secondary | ICD-10-CM

## 2021-05-16 LAB — TYPE AND SCREEN
ABO/RH(D): O POS
Antibody Screen: NEGATIVE
Unit division: 0
Unit division: 0
Unit division: 0
Unit division: 0
Unit division: 0
Unit division: 0

## 2021-05-16 LAB — BASIC METABOLIC PANEL
Anion gap: 5 (ref 5–15)
BUN: 29 mg/dL — ABNORMAL HIGH (ref 8–23)
CO2: 20 mmol/L — ABNORMAL LOW (ref 22–32)
Calcium: 8.8 mg/dL — ABNORMAL LOW (ref 8.9–10.3)
Chloride: 108 mmol/L (ref 98–111)
Creatinine, Ser: 0.96 mg/dL (ref 0.44–1.00)
GFR, Estimated: 59 mL/min — ABNORMAL LOW (ref >60)
Glucose, Bld: 101 mg/dL — ABNORMAL HIGH (ref 70–99)
Potassium: 4 mmol/L (ref 3.5–5.1)
Sodium: 133 mmol/L — ABNORMAL LOW (ref 135–145)

## 2021-05-16 LAB — BPAM RBC
Blood Product Expiration Date: 202212212359
Blood Product Expiration Date: 202212212359
Blood Product Expiration Date: 202212282359
Blood Product Expiration Date: 202301142359
Blood Product Expiration Date: 202301142359
Blood Product Expiration Date: 202301152359
ISSUE DATE / TIME: 202212200833
ISSUE DATE / TIME: 202212201315
ISSUE DATE / TIME: 202212210839
ISSUE DATE / TIME: 202212221456
ISSUE DATE / TIME: 202212230844
Unit Type and Rh: 5100
Unit Type and Rh: 5100
Unit Type and Rh: 5100
Unit Type and Rh: 5100
Unit Type and Rh: 9500
Unit Type and Rh: 9500

## 2021-05-16 LAB — CBC
HCT: 25.7 % — ABNORMAL LOW (ref 36.0–46.0)
Hemoglobin: 8.7 g/dL — ABNORMAL LOW (ref 12.0–15.0)
MCH: 34.5 pg — ABNORMAL HIGH (ref 26.0–34.0)
MCHC: 33.9 g/dL (ref 30.0–36.0)
MCV: 102 fL — ABNORMAL HIGH (ref 80.0–100.0)
Platelets: 106 10*3/uL — ABNORMAL LOW (ref 150–400)
RBC: 2.52 MIL/uL — ABNORMAL LOW (ref 3.87–5.11)
RDW: 20.9 % — ABNORMAL HIGH (ref 11.5–15.5)
WBC: 9.3 10*3/uL (ref 4.0–10.5)
nRBC: 0.2 % (ref 0.0–0.2)

## 2021-05-16 LAB — MAGNESIUM: Magnesium: 1.9 mg/dL (ref 1.7–2.4)

## 2021-05-16 NOTE — Progress Notes (Signed)
°  Progress Note   Patient: Angela Sullivan IRJ:[id number] DOB: June 26, 1940 DOA: 05/16/2021     6 DOS: the patient was seen and examined on 05/16/2021   Brief hospital course: Angela Sullivan is an 80 year old female with past medical history significant for essential hypertension, hyperlipidemia who presented to [facility] ED on 12/18 after being found down at home.  Upon evaluation in ED patient was noted to be hypothermic with severe hyponatremia, confusion, and acute renal failure.  Additionally patient was noted to have bloody stools and underwent EGD and was found to have duodenal ulcers with visible vessels s/p injection and clipping.  During her ICU stay, renal failure improved as well as her sodium level.  Palliative care has been consulted as patient remains confused.  Patient was transferred to hospitalist service on 05/14/2021.  12/24: Waiting for SNF placement  Assessment and Plan * Upper GI bleed- (present on admission) EGD on 12/20 showed several duodenal ulcer 1 with visible vessel status post epinephrine injection and clipping. 3 PRBC transfusion thus far.  Continue Protonix for now.  Avoid NSAIDs.  Monitor H&H.  Stable for now  AKI (acute kidney injury) (HCC)- (present on admission) Likely prerenal.  Improved with hydration.   Acute metabolic encephalopathy- (present on admission) Likely multifactorial including severe hyponatremia.  Now improved and she is close to her baseline mental status.  Hypothermia- (present on admission) Now resolved  Hypokalemia- (present on admission) Repleted, recheck  Rhabdomyolysis- (present on admission) CK level on admission was 1185 likely from fall at home.  Improved with hydration  Hypertension- (present on admission) Patient was hypotensive on admission requiring pressors in the ICU.  These are weaned off.  Likely due to acute blood loss anemia from GI bleed and dehydration.  Monitor blood pressure for now on lisinopril  High cholesterol-  (present on admission) Holding statin due to rhabdo  Hyponatremia- (present on admission) Improved now with hydration.  On admission her sodium was 114-> 131    Subjective: No new complaints.  Agreeable for SNF placement  Objective Vital signs were reviewed and unremarkable.  General exam: Appears calm and comfortable; chronically ill in appearance Respiratory system: Clear to auscultation. Respiratory effort normal.  On room air Cardiovascular system: S1 & S2 heard, RRR. No JVD, murmurs, rubs, gallops or clicks. No pedal edema. Gastrointestinal system: Abdomen is nondistended, soft and nontender. No organomegaly or masses felt. Normal bowel sounds heard. Central nervous system: Alert, not oriented, moves all extremities independently Extremities: Moving all extremities independently Skin: No rashes, lesions or ulcers Psychiatry: Judgment and insight appear poor.  Mood and affect normal.   Data Reviewed: My review of labs, imaging, notes and other tests shows no new significant findings.  Except anemia, hypokalemia, hypoalbuminemia and hyponatremia  Family Communication: None at bedside today  Disposition: Status is: Inpatient  Remains inpatient appropriate because: Waiting for SNF.  Still getting electrolytes replaced   DVT prophylaxis: SCDs due to bleeding concern   Time spent: 25 minutes  Author: Delfino Lovett 05/16/2021 2:46 PM  For on call review [email or web address].

## 2021-05-16 NOTE — Assessment & Plan Note (Addendum)
Likely multifactorial including severe hyponatremia.  Now improved and she is close to her baseline mental status.

## 2021-05-16 NOTE — Assessment & Plan Note (Addendum)
Improved now with hydration.  On admission her sodium was 114-> 134.

## 2021-05-16 NOTE — Assessment & Plan Note (Addendum)
Repleted and resolved 

## 2021-05-16 NOTE — Hospital Course (Addendum)
Angela Sullivan is an 80 year old female with past medical history significant for essential hypertension, hyperlipidemia who presented to [facility] ED on 12/18 after being found down at home.  Upon evaluation in ED patient was noted to be hypothermic with severe hyponatremia, confusion, and acute renal failure.  Additionally patient was noted to have bloody stools and underwent EGD and was found to have duodenal ulcers with visible vessels s/p injection and clipping.  During her ICU stay, renal failure improved as well as her sodium level.  Palliative care has been consulted as patient remains confused.  Patient was transferred to hospitalist service on 05/14/2021.  12/24: Waiting for SNF placement 12/25: Waiting for SNF 12/26: rapid a.fib reported overnight by CCMD but rate is much better controlled. She is not a candidate for anticoagulation considering GI bleed. Will start her on Lasix as she is > 4.7 liter + and has anasarca 12/27: Waiting on SNF placement

## 2021-05-16 NOTE — Assessment & Plan Note (Addendum)
Now resolved.  

## 2021-05-16 NOTE — Assessment & Plan Note (Addendum)
Patient was hypotensive on admission requiring pressors in the ICU.  These are weaned off.  Likely due to acute blood loss anemia from GI bleed and dehydration. Continue low dose metoprolol .

## 2021-05-16 NOTE — Assessment & Plan Note (Addendum)
CK level on admission was 1185 likely from fall at home.  Improved with hydration

## 2021-05-16 NOTE — Assessment & Plan Note (Addendum)
Likely prerenal.  Improved with hydration

## 2021-05-16 NOTE — Assessment & Plan Note (Addendum)
Holding statin due to rhabdo.

## 2021-05-16 NOTE — Assessment & Plan Note (Addendum)
EGD on 12/20 showed several duodenal ulcer 1 with visible vessel status post epinephrine injection and clipping. 3 PRBC transfusion thus far.  Continue Protonix for now.  Avoid NSAIDs.  Monitor H&H.  Stable for now.

## 2021-05-17 DIAGNOSIS — I10 Essential (primary) hypertension: Secondary | ICD-10-CM | POA: Diagnosis not present

## 2021-05-17 DIAGNOSIS — R7989 Other specified abnormal findings of blood chemistry: Secondary | ICD-10-CM | POA: Diagnosis not present

## 2021-05-17 DIAGNOSIS — K922 Gastrointestinal hemorrhage, unspecified: Secondary | ICD-10-CM | POA: Diagnosis not present

## 2021-05-17 DIAGNOSIS — G9341 Metabolic encephalopathy: Secondary | ICD-10-CM | POA: Diagnosis not present

## 2021-05-17 LAB — CBC
HCT: 27.3 % — ABNORMAL LOW (ref 36.0–46.0)
Hemoglobin: 8.9 g/dL — ABNORMAL LOW (ref 12.0–15.0)
MCH: 33.8 pg (ref 26.0–34.0)
MCHC: 32.6 g/dL (ref 30.0–36.0)
MCV: 103.8 fL — ABNORMAL HIGH (ref 80.0–100.0)
Platelets: 102 10*3/uL — ABNORMAL LOW (ref 150–400)
RBC: 2.63 MIL/uL — ABNORMAL LOW (ref 3.87–5.11)
RDW: 20.9 % — ABNORMAL HIGH (ref 11.5–15.5)
WBC: 10.5 10*3/uL (ref 4.0–10.5)
nRBC: 0 % (ref 0.0–0.2)

## 2021-05-17 LAB — BASIC METABOLIC PANEL
Anion gap: 7 (ref 5–15)
BUN: 28 mg/dL — ABNORMAL HIGH (ref 8–23)
CO2: 21 mmol/L — ABNORMAL LOW (ref 22–32)
Calcium: 8.7 mg/dL — ABNORMAL LOW (ref 8.9–10.3)
Chloride: 106 mmol/L (ref 98–111)
Creatinine, Ser: 0.84 mg/dL (ref 0.44–1.00)
GFR, Estimated: >60 mL/min (ref >60)
Glucose, Bld: 102 mg/dL — ABNORMAL HIGH (ref 70–99)
Potassium: 4.1 mmol/L (ref 3.5–5.1)
Sodium: 134 mmol/L — ABNORMAL LOW (ref 135–145)

## 2021-05-17 LAB — CK: Total CK: 30 U/L — ABNORMAL LOW (ref 38–234)

## 2021-05-17 NOTE — Progress Notes (Signed)
RN has responded to request for anxiety medication three times to find patient asleep with in 5 to 10 mins of request. RN will provided medication with maintained alertness

## 2021-05-17 NOTE — Progress Notes (Signed)
°  Progress Note   Patient: Angela Sullivan [id number]:[id number] DOB: 1940-08-02 DOA: May 28, 2021     7 DOS: the patient was seen and examined on 05/17/2021   Brief hospital course: Angela Sullivan is an 80 year old female with past medical history significant for essential hypertension, hyperlipidemia who presented to [facility] ED on 12/18 after being found down at home.  Upon evaluation in ED patient was noted to be hypothermic with severe hyponatremia, confusion, and acute renal failure.  Additionally patient was noted to have bloody stools and underwent EGD and was found to have duodenal ulcers with visible vessels s/p injection and clipping.  During her ICU stay, renal failure improved as well as her sodium level.  Palliative care has been consulted as patient remains confused.  Patient was transferred to hospitalist service on 05/14/2021.  12/24: Waiting for SNF placement 12/25: Waiting for SNF  Assessment and Plan * Upper GI bleed- (present on admission) EGD on 12/20 showed several duodenal ulcer 1 with visible vessel status post epinephrine injection and clipping. 3 PRBC transfusion thus far.  Continue Protonix for now.  Avoid NSAIDs.  Monitor H&H.  Stable for now.  AKI (acute kidney injury) (HCC)- (present on admission) Likely prerenal.  Improved with hydration  Acute metabolic encephalopathy- (present on admission) Likely multifactorial including severe hyponatremia.  Now improved and she is close to her baseline mental status  Hypothermia- (present on admission) Now resolved.  Hypokalemia- (present on admission) Repleted and resolved  Rhabdomyolysis- (present on admission) CK level on admission was 1185 likely from fall at home.  Improved with hydration.  Hypertension- (present on admission) Patient was hypotensive on admission requiring pressors in the ICU.  These are weaned off.  Likely due to acute blood loss anemia from GI bleed and dehydration.  Monitor blood pressure for now on  lisinopril.  High cholesterol- (present on admission) Holding statin due to rhabdo.  Hyponatremia- (present on admission) Improved now with hydration.  On admission her sodium was 114-> 131.     Subjective: No new complaints.  She was somewhat anxious yesterday but is better now  Objective Vital signs were reviewed and unremarkable.  General exam: Appears calm and comfortable; chronically ill in appearance Respiratory system: Clear to auscultation. Respiratory effort normal.  On room air Cardiovascular system: S1 & S2 heard, RRR. No JVD, murmurs, rubs, gallops or clicks. No pedal edema. Gastrointestinal system: Abdomen is nondistended, soft and nontender. No organomegaly or masses felt. Normal bowel sounds heard. Central nervous system: Alert, not oriented, moves all extremities independently Extremities: Moving all extremities independently Skin: No rashes, lesions or ulcers Psychiatry: Judgment and insight appear poor.  Mood and affect normal.  Data Reviewed: My review of labs, imaging, notes and other tests shows no new significant findings.   Family Communication: I called patient's friend Vikki Ports on her mobile but no response  Disposition: Status is: Inpatient  Remains inpatient appropriate because: Waiting for SNF placement   DVT prophylaxis: SCDs due to bleeding concern    Time spent: 35 minutes  Author: Delfino Lovett 05/17/2021 1:07 PM  For on call review [email or web address].

## 2021-05-18 DIAGNOSIS — K922 Gastrointestinal hemorrhage, unspecified: Secondary | ICD-10-CM | POA: Diagnosis not present

## 2021-05-18 DIAGNOSIS — G9341 Metabolic encephalopathy: Secondary | ICD-10-CM | POA: Diagnosis not present

## 2021-05-18 DIAGNOSIS — R601 Generalized edema: Secondary | ICD-10-CM

## 2021-05-18 DIAGNOSIS — N179 Acute kidney failure, unspecified: Secondary | ICD-10-CM | POA: Diagnosis not present

## 2021-05-18 MED ORDER — METOPROLOL TARTRATE 25 MG/10 ML ORAL SUSPENSION
6.2500 mg | Freq: Two times a day (BID) | ORAL | Status: DC
  Administered 2021-05-18 – 2021-05-20 (×5): 6.25 mg via ORAL
  Filled 2021-05-18 (×6): qty 2.5

## 2021-05-18 MED ORDER — FUROSEMIDE 10 MG/ML IJ SOLN
40.0000 mg | INTRAMUSCULAR | Status: AC
  Administered 2021-05-18: 09:00:00 40 mg via INTRAVENOUS
  Filled 2021-05-18: qty 4

## 2021-05-18 MED ORDER — METOPROLOL TARTRATE 25 MG PO TABS
12.5000 mg | ORAL_TABLET | Freq: Two times a day (BID) | ORAL | Status: DC
Start: 2021-05-18 — End: 2021-05-18

## 2021-05-18 MED ORDER — PANTOPRAZOLE SODIUM 40 MG PO TBEC
40.0000 mg | DELAYED_RELEASE_TABLET | Freq: Two times a day (BID) | ORAL | Status: DC
  Administered 2021-05-18 – 2021-05-20 (×5): 40 mg via ORAL
  Filled 2021-05-18 (×5): qty 1

## 2021-05-18 MED ORDER — FUROSEMIDE 10 MG/ML IJ SOLN
40.0000 mg | Freq: Two times a day (BID) | INTRAMUSCULAR | Status: DC
  Administered 2021-05-18 – 2021-05-19 (×2): 40 mg via INTRAVENOUS
  Filled 2021-05-18 (×2): qty 4

## 2021-05-18 NOTE — Assessment & Plan Note (Addendum)
Continue low dose toprol for rate control, not a candidate for anticoagulation due to gi bleed

## 2021-05-18 NOTE — TOC Progression Note (Signed)
Transition of Care [facility]) - Progression Note    Patient Details  Name: Angela Sullivan MRN: [id number] Date of Birth: 12/11/1940  [facility] CM/SW Contact  Caryn Section, RN Phone Number: 05/18/2021, 3:02 PM  Clinical Narrative:   Patient in need of SNF, however TOC has been unable to reach friends for consent.  RNCM left messages with both friends, awaiting call back.  PT last visit was 12/23, they will see patient today for updated PT information for Snf.  TOC to follow for needs through hospital stay.    Expected Discharge Plan: Skilled Nursing Facility Barriers to Discharge: Continued Medical Work up  Expected Discharge Plan and Services Expected Discharge Plan: Skilled Nursing Facility   Discharge Planning Services: CM Consult Post Acute Care Choice: Skilled Nursing Facility Living arrangements for the past 2 months: Single Family Home                 DME Arranged: N/A DME Agency: NA                   Social Determinants of Health (SDOH) Interventions    Readmission Risk Interventions No flowsheet data found.

## 2021-05-18 NOTE — Assessment & Plan Note (Addendum)
Poor mobility, low albumin and iatrogenic fluid (as she had rhabdo on admission from fall) overload.  Diuresing with Lasix as she was positive fluid balance. Net IO Since Admission: 576.55 mL [05/19/21 1209]

## 2021-05-18 NOTE — Progress Notes (Signed)
Progress Note   Patient: Angela Sullivan GEX:[id number] DOB: 09-22-1940 DOA: 04/25/2021     8 DOS: the patient was seen and examined on 05/18/2021   Brief hospital course: Angela Sullivan is an 80 year old female with past medical history significant for essential hypertension, hyperlipidemia who presented to [facility] ED on 12/18 after being found down at home.  Upon evaluation in ED patient was noted to be hypothermic with severe hyponatremia, confusion, and acute renal failure.  Additionally patient was noted to have bloody stools and underwent EGD and was found to have duodenal ulcers with visible vessels s/p injection and clipping.  During her ICU stay, renal failure improved as well as her sodium level.  Palliative care has been consulted as patient remains confused.  Patient was transferred to hospitalist service on 05/14/2021.  12/24: Waiting for SNF placement 12/25: Waiting for SNF 12/26: rapid a.fib reported overnight by CCMD but rate is much better controlled. She is not a candidate for anticoagulation considering GI bleed. Will start her on Lasix as she is > 4.7 liter + and has anasarca  Assessment and Plan * Upper GI bleed- (present on admission) EGD on 12/20 showed several duodenal ulcer 1 with visible vessel status post epinephrine injection and clipping. 3 PRBC transfusion thus far.  Continue Protonix for now.  Avoid NSAIDs.  Monitor H&H.  Stable for now  Anasarca- (present on admission) Poor mobility, low albumin and iatrogenic fluid (as she had rhabdo on admission from fall) overload. Will start Lasix 40 mg IV bid, give 40 mg IV once (to have 80 mg this am), strict I & O and encourage ambulation. No PT since 12/23. TOC reaching out to PT to work with her  AKI (acute kidney injury) (HCC)- (present on admission) Likely prerenal.  Improved with hydration.  Acute metabolic encephalopathy- (present on admission) Likely multifactorial including severe hyponatremia.  Now improved and she  is close to her baseline mental status  Hypothermia- (present on admission) Now resolved  Hypokalemia- (present on admission) Repleted and resolved.  Rhabdomyolysis- (present on admission) CK level on admission was 1185 likely from fall at home.  Improved with hydration.  New onset atrial fibrillation (HCC)- (present on admission) Start low dose toprol for rate control, not a candidate for anticoagulation due to gi bleed  Hypertension- (present on admission) Patient was hypotensive on admission requiring pressors in the ICU.  These are weaned off.  Likely due to acute blood loss anemia from GI bleed and dehydration. Added low dose metoprolol today  High cholesterol- (present on admission) Holding statin due to rhabdo  Hyponatremia- (present on admission) Improved now with hydration.  On admission her sodium was 114-> 134    Subjective: had rapid a.fib reported last night by CCMD but not symptomatic, rate is well controlled now.  Objective Vital signs were reviewed and unremarkable.  General exam: Appears calm and comfortable; chronically ill in appearance. Anasarca + Respiratory system: Clear to auscultation. Respiratory effort normal.  On room air Cardiovascular system: S1 & S2 heard, RRR. No JVD, murmurs, rubs, gallops or clicks. No pedal edema. Gastrointestinal system: Abdomen is nondistended, soft and nontender. No organomegaly or masses felt. Normal bowel sounds heard. Central nervous system: Alert, not oriented, moves all extremities independently Extremities: Moving all extremities independently Skin: No rashes, lesions or ulcers Psychiatry: Judgment and insight appear poor.  Mood and affect normal.  Data Reviewed: My review of labs, imaging, notes and other tests shows no new significant findings.   Family Communication: tried to  reach friend y'day but no luck  Disposition: Status is: Inpatient  Remains inpatient appropriate because: waiting for SNF. Diurese  while here.  DVT prophylaxis - SCDs     Time spent: 35 minutes  Author: Delfino Lovett 05/18/2021 11:22 AM  For on call review [email or web address].

## 2021-05-18 NOTE — Progress Notes (Addendum)
Telemetry report called by CCMD at 12:25 for patient. CCMD technician reports patient was a-fib with RVR. Noted that CCMD documented A-fib with RVR at 1900, nurse was not informed at 1900, patient telemetry reading is now a-fib, will continue to monitor patient.

## 2021-05-18 NOTE — Progress Notes (Signed)
PHARMACIST - PHYSICIAN COMMUNICATION  DR:   Sherryll Burger  CONCERNING: IV to Oral Route Change Policy  RECOMMENDATION: This patient is receiving Pantoprazole by the intravenous route.  Based on criteria approved by the Pharmacy and Therapeutics Committee, the intravenous medication(s) is/are being converted to the equivalent oral dose form(s).   DESCRIPTION: These criteria include: The patient is eating (either orally or via tube) and/or has been taking other orally administered medications for a least 24 hours The patient has no evidence of active gastrointestinal bleeding or impaired GI absorption (gastrectomy, short bowel, patient on TNA or NPO).  If you have questions about this conversion, please contact the Pharmacy Department  []   [phone] )  ( [phone] [x]   [phone] )  [place] []   [phone] )  [facility] []   [phone] )  [facility] []   [phone] )  [facility]   ( [phone], PharmD, BCPS Clinical Pharmacist   05/18/2021 1:57 PM

## 2021-05-19 DIAGNOSIS — G9341 Metabolic encephalopathy: Secondary | ICD-10-CM | POA: Diagnosis not present

## 2021-05-19 DIAGNOSIS — N179 Acute kidney failure, unspecified: Secondary | ICD-10-CM | POA: Diagnosis not present

## 2021-05-19 DIAGNOSIS — K922 Gastrointestinal hemorrhage, unspecified: Secondary | ICD-10-CM | POA: Diagnosis not present

## 2021-05-19 DIAGNOSIS — Z66 Do not resuscitate: Secondary | ICD-10-CM

## 2021-05-19 DIAGNOSIS — R140 Abdominal distension (gaseous): Secondary | ICD-10-CM | POA: Diagnosis not present

## 2021-05-19 DIAGNOSIS — R52 Pain, unspecified: Secondary | ICD-10-CM

## 2021-05-19 DIAGNOSIS — R7989 Other specified abnormal findings of blood chemistry: Secondary | ICD-10-CM | POA: Diagnosis not present

## 2021-05-19 LAB — COMPREHENSIVE METABOLIC PANEL
ALT: 23 U/L (ref 0–44)
AST: 24 U/L (ref 15–41)
Albumin: 2.7 g/dL — ABNORMAL LOW (ref 3.5–5.0)
Alkaline Phosphatase: 109 U/L (ref 38–126)
Anion gap: 9 (ref 5–15)
BUN: 25 mg/dL — ABNORMAL HIGH (ref 8–23)
CO2: 25 mmol/L (ref 22–32)
Calcium: 8.7 mg/dL — ABNORMAL LOW (ref 8.9–10.3)
Chloride: 104 mmol/L (ref 98–111)
Creatinine, Ser: 0.92 mg/dL (ref 0.44–1.00)
GFR, Estimated: >60 mL/min (ref >60)
Glucose, Bld: 98 mg/dL (ref 70–99)
Potassium: 3.5 mmol/L (ref 3.5–5.1)
Sodium: 138 mmol/L (ref 135–145)
Total Bilirubin: 1.9 mg/dL — ABNORMAL HIGH (ref 0.3–1.2)
Total Protein: 5.5 g/dL — ABNORMAL LOW (ref 6.5–8.1)

## 2021-05-19 LAB — CBC
HCT: 28 % — ABNORMAL LOW (ref 36.0–46.0)
Hemoglobin: 9.2 g/dL — ABNORMAL LOW (ref 12.0–15.0)
MCH: 34.6 pg — ABNORMAL HIGH (ref 26.0–34.0)
MCHC: 32.9 g/dL (ref 30.0–36.0)
MCV: 105.3 fL — ABNORMAL HIGH (ref 80.0–100.0)
Platelets: 120 10*3/uL — ABNORMAL LOW (ref 150–400)
RBC: 2.66 MIL/uL — ABNORMAL LOW (ref 3.87–5.11)
RDW: 19.9 % — ABNORMAL HIGH (ref 11.5–15.5)
WBC: 9.6 10*3/uL (ref 4.0–10.5)
nRBC: 0 % (ref 0.0–0.2)

## 2021-05-19 MED ORDER — FUROSEMIDE 40 MG PO TABS
40.0000 mg | ORAL_TABLET | Freq: Every day | ORAL | Status: DC
  Administered 2021-05-19 – 2021-05-20 (×2): 40 mg via ORAL
  Filled 2021-05-19 (×2): qty 1

## 2021-05-19 NOTE — [place] FL2 (Signed)
[place] MEDICAID FL2 LEVEL OF CARE SCREENING TOOL     IDENTIFICATION  Patient Name: Angela Sullivan Birthdate: 10/05/1940 Sex: female Admission Date (Current Location): 05/19/2021  [facility] and [place] Number:  Chiropodist and Address:  [facility], [address] [place], [place] [address]      Provider Number: [id number]  Attending Physician Name and Address:  Delfino Lovett, MD  Relative Name and Phone Number:  [name],[name]   [phone] (Mobile)[name],[name]     [phone]    Current Level of Care: Hospital Recommended Level of Care: Skilled Nursing Facility Prior Approval Number:    Date Approved/Denied:   PASRR Number: [id number]  Discharge Plan: SNF    Current Diagnoses: Patient Active Problem List   Diagnosis Date Noted   Anasarca 05/18/2021   Upper GI bleed 05/16/2021   Acute metabolic encephalopathy 05/16/2021   AKI (acute kidney injury) (HCC) 05/16/2021   Pressure injury of skin 05/03/2021   Hypothermia 05/16/2021   High cholesterol    Hypertension    Fall at home, initial encounter    New onset atrial fibrillation (HCC)    Elevated troponin    Abnormal LFTs    Rhabdomyolysis    Hypokalemia    SIRS (systemic inflammatory response syndrome) (HCC)    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   Hyponatremia 03/21/2017    Orientation RESPIRATION BLADDER Height & Weight     Self, Time, Situation, Place  Normal External catheter Weight: 93.2 kg Height:  5\' 2"  (157.5 cm)  BEHAVIORAL SYMPTOMS/MOOD NEUROLOGICAL BOWEL NUTRITION STATUS        Diet (Soft)  AMBULATORY STATUS COMMUNICATION OF NEEDS Skin   Extensive Assist (15 feet with 2 wheeled walker) Verbally Bruising (Weeping ecchymosis, R arm; scattered ecchymosis to arms abdomen and hip)                       Personal Care Assistance Level of Assistance  Bathing, Feeding, Dressing Bathing Assistance: Maximum assistance Feeding assistance:  Limited assistance Dressing Assistance: Maximum assistance     Functional Limitations Info  Sight, Hearing, Speech Sight Info: Impaired Hearing Info: Impaired Speech Info: Adequate    SPECIAL CARE FACTORS FREQUENCY  PT (By licensed PT), OT (By licensed OT)     PT Frequency: 5x weekly OT Frequency: 5x weekly            Contractures Contractures Info: Not present    Additional Factors Info  Code Status, Allergies Code Status Info: DNR Allergies Info: No Known Allergies           Current Medications (05/19/2021):  This is the current hospital active medication list Current Facility-Administered Medications  Medication Dose Route Frequency Provider Last Rate Last Admin   0.9 %  sodium chloride infusion (Manually program via Guardrails IV Fluids)   Intravenous Once 05/21/2021, NP       0.9 %  sodium chloride infusion  250 mL Intravenous Continuous Jimmye Norman D, NP   Stopped at 05/13/21 1134   chlorhexidine (PERIDEX) 0.12 % solution 15 mL  15 mL Mouth Rinse BID 05/15/21, MD   15 mL at 05/18/21 2239   Chlorhexidine Gluconate Cloth 2 % PADS 6 each  6 each Topical Q0600 2240, MD   6 each at 05/19/21 0621   feeding supplement (ENSURE ENLIVE / ENSURE PLUS) liquid 237 mL  237 mL Oral TID BM 05/21/21, MD   237 mL at 05/19/21 1020  folic acid (FOLVITE) tablet 1 mg  1 mg Oral Daily Vanga, Loel Dubonnet, MD   1 mg at 05/19/21 1013   furosemide (LASIX) tablet 40 mg  40 mg Oral Daily Delfino Lovett, MD       MEDLINE mouth rinse  15 mL Mouth Rinse q12n4p Belia Heman, Kurian, MD   15 mL at 05/18/21 1310   metoprolol tartrate (LOPRESSOR) 25 mg/10 mL oral suspension 6.25 mg  6.25 mg Oral BID Delfino Lovett, MD   6.25 mg at 05/19/21 1027   multivitamin with minerals tablet 1 tablet  1 tablet Oral Daily Vida Rigger, MD   1 tablet at 05/19/21 1013   pantoprazole (PROTONIX) EC tablet 40 mg  40 mg Oral BID Sharen Hones, RPH   40 mg at 05/19/21 1013   polyethylene  glycol (MIRALAX / GLYCOLAX) packet 17 g  17 g Oral Daily Toney Reil, MD   17 g at 05/18/21 0801     Discharge Medications: Please see discharge summary for a list of discharge medications.  Relevant Imaging Results:  Relevant Lab Results:   Additional Information SSN [id number]  Caryn Section, RN

## 2021-05-19 NOTE — Progress Notes (Signed)
Palliative Care Progress Note, Assessment & Plan   Patient Name: Angela Sullivan       Date: 05/19/2021 DOB: 03-25-1941  Age: 80 y.o. MRN#: [id number] Attending Physician: Max Sane, MD Primary Care Physician: Derinda Late, MD Admit Date: 04/24/2021  Reason for Consultation/Follow-up: Establishing goals of care  Subjective: Patient is sitting up in recliner.  She is propped up with several pillows and working on eating her breakfast.  Her friend Mateo Flow is at bedside.  Patient denies pain and has no acute complaints at this time.  HPI: Patient is an 80 year old female with a past medical history significant for hypertension and hyperlipidemia.  She was admitted on 12/18 after being found down at home.  Patient was hypothermic, hyponatremic, confused, and acute renal failure, and had bloody stools.  As per EGD patient was found to have duodenal ulcers status post injection and clipping.  On 12-26 patient had rapid A. fib with RVR.  She is not a candidate for anticoagulation related to her GI bleed.  She is better rate controlled now.  The plan is for patient to be discharged to a skilled nursing facility for rehab.   Palliative medicine team was consulted to discuss goals of care.  I am following up from my colleague Quinn Axe who met with the patient last week. Summary of counseling/coordination of care: After reviewing the patient's chart, I assessed the patient at bedside.  After introducing myself to the patient and her friend Mateo Flow, I reiterated that palliative medicine is an extra layer support for the patient and family as they navigate chronic disease management.  Education provided on advanced care planning and goals of care.  Outpatient palliative services described.  Patient and friend were  in agreement to have palliative outpatient follow at discharge.  After leaving the room the patient's friend Mateo Flow found me in the hall.  She shared many concerns regarding the patient's home, whether patient would need long-term care, and how she would get the patient her new dentures.  Therapeutic silence and active listening provided for her to share her thoughts and emotions regarding the patient's current health state.  Mateo Flow seems overwhelmed and worried about the patient's future as far as needing long-term care.  I shared that in the situations taking it 1 day at a time and 1 decision at a time can help.  Value was appreciative of my time.  Discussed importance for patient and Mateo Flow to continue advance care planning discussions and goals of care discussions with medical team.  Palliative medicine will continue to follow the patient throughout her hospitalization.  I shared that we will monitor her on the peripheral and intervene if called or if patient's status declines.   Code Status: DNR  Prognosis: Unable to determine  Discharge Planning: [facility] for rehab with Palliative care service follow-up  Recommendations/Plan: Outpatient palliative services referral placed  Care plan was discussed with patient, patient's friend Mateo Flow  Physical Exam Vitals and nursing note reviewed.  Constitutional:      Appearance: Normal appearance.  HENT:     Head: Normocephalic and atraumatic.     Mouth/Throat:     Mouth: Mucous membranes are moist.  Cardiovascular:  Rate and Rhythm: Normal rate.     Pulses: Normal pulses.  Pulmonary:     Effort: Pulmonary effort is normal.  Abdominal:     Palpations: Abdomen is soft.  Musculoskeletal:     Comments: Generalized weakness  Skin:    General: Skin is warm and dry.  Neurological:     Mental Status: She is alert. Mental status is at baseline.  Psychiatric:        Mood and Affect: Mood normal.        Behavior:  Behavior normal.        Thought Content: Thought content normal.        Judgment: Judgment normal.            Flowsheet Rows    Flowsheet Row Most Recent Value  Intake Tab   Referral Department Hospitalist  Unit at Time of Referral Intermediate Care Unit  Palliative Care Primary Diagnosis Sepsis/Infectious Disease  Date Notified 05/18/2021  Palliative Care Type New Palliative care  Reason for referral Clarify Goals of Care  Date of Admission 04/27/2021  Date first seen by Palliative Care 05/13/21  # of days Palliative referral response time 1 Day(s)  # of days IP prior to Palliative referral 2  Clinical Assessment   Palliative Performance Scale Score 30%  Pain Max last 24 hours Not able to report  Pain Min Last 24 hours Not able to report  Dyspnea Max Last 24 Hours Not able to report  Dyspnea Min Last 24 hours Not able to report  Psychosocial & Spiritual Assessment   Palliative Care Outcomes         Total Time 15 minutes  Greater than 50%  of this time was spent counseling and coordinating care related to the above assessment and plan.  Thank you for allowing the Palliative Medicine Team to assist in the care of this patient.  [place] Ilsa Iha, FNP-BC Palliative Medicine Team Team Phone # [phone]

## 2021-05-19 NOTE — Progress Notes (Signed)
Occupational Therapy Treatment Patient Details Name: Angela Sullivan MRN: [id number] DOB: 1940/06/25 Today's Date: 05/19/2021   History of present illness Angela Sullivan is an 80yoF who comes to [facility] on 05/02/2021 after unwitnessed fall, assisted up by EMS, refused transport to ED, then fell again later in day. Pt down for sustained time in cold environment, 93degrees upon arrival, hyponatremia 114. Bloody stools -> EGD c duodenal ulcers. CK levels on arrival 1185. PMH: HLD, HTN.   OT comments  Pt seen for OT treatment on this date. Upon arrival to room, pt awake and seated upright in chair with friend Vikki Ports) present. Pt alert & oriented to self, place, and parts of situation, and reporting no pain. Pt agreeable to tx. Pt currently requires SUPERVISION/SET-UP for seated grooming tasks, MAX A to don b/l socks, MOD A for stand pivot transfer to/from [facility], and MAX A for posterior pericare in standing, with pt reliant on BUE support from RW in standing d/t decreased balance and activity tolerance. Pt is making good progress toward goals and continues to benefit from skilled OT services to maximize return to PLOF and minimize risk of future falls, injury, and readmission. Will continue to follow POC. Discharge recommendation remains appropriate.     Recommendations for follow up therapy are one component of a multi-disciplinary discharge planning process, led by the attending physician.  Recommendations may be updated based on patient status, additional functional criteria and insurance authorization.    Follow Up Recommendations  Skilled nursing-short term rehab (<3 hours/day)    Assistance Recommended at Discharge Intermittent Supervision/Assistance  Equipment Recommendations  Other (comment) (defer to next venue of care)       Precautions / Restrictions Precautions Precautions: Fall Restrictions Weight Bearing Restrictions: No       Mobility Bed Mobility               General bed  mobility comments: not assessed, pt in recliner at beginning/end of session    Transfers Overall transfer level: Needs assistance Equipment used: Rolling walker (2 wheels) Transfers: Sit to/from Stand;Bed to chair/wheelchair/BSC Sit to Stand: Min assist Stand pivot transfers: Mod assist         General transfer comment: verbal cues for technique & safe hand placement with RW use     Balance Overall balance assessment: Needs assistance Sitting-balance support: Feet supported;Bilateral upper extremity supported Sitting balance-Leahy Scale: Fair Sitting balance - Comments: Pt verbalizing fear of bending outside BOS for LB dressing   Standing balance support: Bilateral upper extremity supported;Reliant on assistive device for balance;During functional activity Standing balance-Leahy Scale: Fair Standing balance comment: Able to maintain static standing balance with UE supported by RW. Requires MOD A for stand pivot transfers                           ADL either performed or assessed with clinical judgement   ADL Overall ADL's : Needs assistance/impaired     Grooming: Oral care;Supervision/safety;Set up;Sitting               Lower Body Dressing: Maximal assistance;Sitting/lateral leans Lower Body Dressing Details (indicate cue type and reason): to don/doff socks Toilet Transfer: Moderate assistance;Stand-pivot;BSC/3in1   Toileting- Clothing Manipulation and Hygiene: Maximal assistance;Sit to/from stand              Extremity/Trunk Assessment Upper Extremity Assessment Upper Extremity Assessment: Generalized weakness   Lower Extremity Assessment Lower Extremity Assessment: Generalized weakness  Cognition Arousal/Alertness: Awake/alert Behavior During Therapy: WFL for tasks assessed/performed Overall Cognitive Status: Impaired/Different from baseline Area of Impairment: Orientation;Safety/judgement                 Orientation Level:  Disoriented to;Time       Safety/Judgement: Decreased awareness of safety;Decreased awareness of deficits                           Pertinent Vitals/ Pain       Pain Assessment: No/denies pain   Frequency  Min 2X/week        Progress Toward Goals  OT Goals(current goals can now be found in the care plan section)  Progress towards OT goals: Progressing toward goals  Acute Rehab OT Goals Patient Stated Goal: to feel better OT Goal Formulation: With patient Time For Goal Achievement: 05/29/21 Potential to Achieve Goals: Good  Plan Discharge plan remains appropriate;Frequency remains appropriate       AM-PAC OT "6 Clicks" Daily Activity     Outcome Measure   Help from another person eating meals?: None Help from another person taking care of personal grooming?: A Little Help from another person toileting, which includes using toliet, bedpan, or urinal?: A Lot Help from another person bathing (including washing, rinsing, drying)?: A Lot Help from another person to put on and taking off regular upper body clothing?: A Little Help from another person to put on and taking off regular lower body clothing?: A Lot 6 Click Score: 16    End of Session Equipment Utilized During Treatment: Rolling walker (2 wheels)  OT Visit Diagnosis: Unsteadiness on feet (R26.81);Other abnormalities of gait and mobility (R26.89)   Activity Tolerance Patient tolerated treatment well   Patient Left in chair;with call bell/phone within reach;with chair alarm set;with family/visitor present   Nurse Communication Mobility status        Time: 3383-2919 OT Time Calculation (min): 42 min  Charges: OT General Charges $OT Visit: 1 Visit OT Treatments $Self Care/Home Management : 38-52 mins  Matthew Folks, OTR/L [facility] [phone]

## 2021-05-19 NOTE — TOC Progression Note (Signed)
Transition of Care [facility]) - Progression Note    Patient Details  Name: Angela Sullivan MRN: [id number] Date of Birth: [birth year]  Transition of Care [facility]) CM/SW Contact  Caryn Section, RN Phone Number: 05/19/2021, 12:54 PM  Clinical Narrative:   Patient and friend granted permission for patient to be transferred to a skilled nursing facility.  Bed search started     Expected Discharge Plan: Skilled Nursing Facility Barriers to Discharge: Continued Medical Work up  Expected Discharge Plan and Services Expected Discharge Plan: Skilled Nursing Facility   Discharge Planning Services: CM Consult Post Acute Care Choice: Skilled Nursing Facility Living arrangements for the past 2 months: Single Family Home                 DME Arranged: N/A DME Agency: NA                   Social Determinants of Health (SDOH) Interventions    Readmission Risk Interventions No flowsheet data found.

## 2021-05-19 NOTE — Progress Notes (Signed)
Physical Therapy Treatment Patient Details Name: Angela Sullivan MRN: [id number] DOB: Dec 14, 1940 Today's Date: 05/19/2021   History of Present Illness Angela Sullivan is an 80yoF who comes [facility] on [facility]14[facility]21 after unwitnessed fall, assisted up by EMS, refused transport to ED, then fell again later in day. Pt down for sustained time in cold environment, 93degrees upon arrival, hyponatremia 114. Bloody stools -> EGD c duodenal ulcers. CK levels on arrival 1185. PMH: HLD, HTN.    PT Comments    Pt in bed at entry, wants to get up to chair to eat breakfast. minA to EOB, minA to stand with RW. Practiced 3x STS from recliner x2 with BUE pull on bedrail as RW is not particularly ergonomic for pt needs. Pt remains weak, deconditioned, unable to AMB to stand at [facility]. Will benefit from STR at DC to progress safety and mobility in anticipated and eventual return to home.    Recommendations for follow up therapy are one component of a multi-disciplinary discharge planning process, led by the attending physician.  Recommendations may be updated based on patient status, additional functional criteria and insurance authorization.  Follow Up Recommendations  Skilled nursing-short term rehab (<3 hours/day)     Assistance Recommended at Discharge Intermittent Supervision/Assistance  Equipment Recommendations  Rolling walker (2 wheels)    Recommendations for Other Services       Precautions / Restrictions Precautions Precautions: Fall Restrictions Weight Bearing Restrictions: No     Mobility  Bed Mobility Overal bed mobility: Needs Assistance Bed Mobility: Supine to Sit     Supine to sit: [facility] elevated;Min assist         Transfers Overall transfer level: Needs assistance Equipment used: Rolling walker (2 wheels) Transfers: Sit to/from Stand;Bed to chair/wheelchair/BSC Sit to Stand: Min assist Stand pivot transfers: Mod assist         General transfer comment: verbal cues for technique  & safe hand placement with RW use    Ambulation/Gait                   Stairs             Wheelchair Mobility    Modified Rankin (Stroke Patients Only)       Balance Overall balance assessment: Needs assistance Sitting-balance support: Feet supported;Bilateral upper extremity supported Sitting balance-Leahy Scale: Fair Sitting balance - Comments: Pt verbalizing fear of bending outside BOS for LB dressing   Standing balance support: Bilateral upper extremity supported;Reliant on assistive device for balance;During functional activity Standing balance-Leahy Scale: Fair Standing balance comment: Able to maintain static standing balance with UE supported by RW. Requires MOD A for stand pivot transfers                            Cognition Arousal/Alertness: Awake/alert Behavior During Therapy: WFL for tasks assessed/performed Overall Cognitive Status: Within Functional Limits for tasks assessed Area of Impairment: Orientation;Safety/judgement                 Orientation Level: Disoriented to;Time       Safety/Judgement: Decreased awareness of safety;Decreased awareness of deficits              Exercises Other Exercises Other Exercises: STS from chair, BUE pull on anterior bed rail 2 sets of 3 (HR increase with SOB each time)    General Comments        Pertinent Vitals/Pain Pain Assessment: No/denies pain    Home Living  Prior Function            PT Goals (current goals can now be found in the care plan section) Acute Rehab PT Goals Patient Stated Goal: regain strength and return to living alone independently PT Goal Formulation: With patient Time For Goal Achievement: 05/29/21 Potential to Achieve Goals: Fair Progress towards PT goals: Progressing toward goals    Frequency    Min 2X/week      PT Plan Current plan remains appropriate    Co-evaluation              AM-PAC  PT "6 Clicks" Mobility   Outcome Measure  Help needed turning from your back to your side while in a flat bed without using bedrails?: A Lot Help needed moving from lying on your back to sitting on the side of a flat bed without using bedrails?: A Lot Help needed moving to and from a bed to a chair (including a wheelchair)?: A Lot Help needed standing up from a chair using your arms (e.g., wheelchair or bedside chair)?: A Lot Help needed to walk in hospital room?: A Lot Help needed climbing 3-5 steps with a railing? : A Lot 6 Click Score: 12    End of Session   Activity Tolerance: Patient tolerated treatment well;Patient limited by fatigue Patient left: in chair;with call bell/phone within reach;with family/visitor present;with chair alarm set Nurse Communication: Mobility status PT Visit Diagnosis: Unsteadiness on feet (R26.81);Difficulty in walking, not elsewhere classified (R26.2);Other abnormalities of gait and mobility (R26.89);Repeated falls (R29.6);Muscle weakness (generalized) (M62.81)     Time: 6283-1517 PT Time Calculation (min) (ACUTE ONLY): 23 min  Charges:  $Therapeutic Exercise: 23-37 mins                    2:53 PM, 05/19/21 Angela Sullivan, PT, DPT Physical Therapist - [facility]  [phone] (ASCOM)    Angela Sullivan C 05/19/2021, 2:52 PM

## 2021-05-19 NOTE — Progress Notes (Signed)
Progress Note   Patient: Angela Sullivan ZOX:[id number] DOB: May 30, 1940 DOA: 05/09/2021     9 DOS: the patient was seen and examined on 05/19/2021   Brief hospital course: Angela Sullivan is an 80 year old female with past medical history significant for essential hypertension, hyperlipidemia who presented to [facility] ED on 12/18 after being found down at home.  Upon evaluation in ED patient was noted to be hypothermic with severe hyponatremia, confusion, and acute renal failure.  Additionally patient was noted to have bloody stools and underwent EGD and was found to have duodenal ulcers with visible vessels s/p injection and clipping.  During her ICU stay, renal failure improved as well as her sodium level.  Palliative care has been consulted as patient remains confused.  Patient was transferred to hospitalist service on 05/14/2021.  12/24: Waiting for SNF placement 12/25: Waiting for SNF 12/26: rapid a.fib reported overnight by CCMD but rate is much better controlled. She is not a candidate for anticoagulation considering GI bleed. Will start her on Lasix as she is > 4.7 liter + and has anasarca 12/27: Waiting on SNF placement  Assessment and Plan * Upper GI bleed- (present on admission) EGD on 12/20 showed several duodenal ulcer 1 with visible vessel status post epinephrine injection and clipping. 3 PRBC transfusion thus far.  Continue Protonix for now.  Avoid NSAIDs.  Monitor H&H.  Stable for now.  Anasarca- (present on admission) Poor mobility, low albumin and iatrogenic fluid (as she had rhabdo on admission from fall) overload.  Diuresing with Lasix as she was positive fluid balance. Net IO Since Admission: 576.55 mL [05/19/21 1209]  AKI (acute kidney injury) (HCC)- (present on admission) Likely prerenal.  Improved with hydration  Acute metabolic encephalopathy- (present on admission) Likely multifactorial including severe hyponatremia.  Now improved and she is close to her baseline mental  status.  Hypothermia- (present on admission) Now resolved.  Hypokalemia- (present on admission) Repleted and resolved  Rhabdomyolysis- (present on admission) CK level on admission was 1185 likely from fall at home.  Improved with hydration  New onset atrial fibrillation (HCC)- (present on admission) Continue low dose toprol for rate control, not a candidate for anticoagulation due to gi bleed  Hypertension- (present on admission) Patient was hypotensive on admission requiring pressors in the ICU.  These are weaned off.  Likely due to acute blood loss anemia from GI bleed and dehydration. Continue low dose metoprolol .  High cholesterol- (present on admission) Holding statin due to rhabdo.  Hyponatremia- (present on admission) Improved now with hydration.  On admission her sodium was 114-> 134.    Subjective: Sitting in the chair.  Minimal cough, feeling weak.  Friend at bedside  Objective Vital signs were reviewed and unremarkable.  General exam: Appears calm and comfortable; chronically ill in appearance. Anasarca + Respiratory system: Clear to auscultation. Respiratory effort normal.  On room air Cardiovascular system: S1 & S2 heard, RRR. No JVD, murmurs, rubs, gallops or clicks. No pedal edema. Gastrointestinal system: Abdomen is nondistended, soft and nontender. No organomegaly or masses felt. Normal bowel sounds heard. Central nervous system: Alert, not oriented, moves all extremities independently Extremities: Moving all extremities independently Skin: No rashes, lesions or ulcers Psychiatry: Judgment and insight appear poor.  Mood and affect normal.  Data Reviewed: My review of labs, imaging, notes and other tests shows no new significant findings.   Family Communication: Updated friend at the bedside  Disposition: Status is: Inpatient  Remains inpatient appropriate because: Waiting for SNF.  Medically  stable   DVT prophylaxis-SCDs  Time spent: 35  minutes  Author: Delfino Lovett 05/19/2021 12:09 PM  For on call review [email or web address].

## 2021-05-20 ENCOUNTER — Inpatient Hospital Stay: Payer: Medicare Other

## 2021-05-20 ENCOUNTER — Encounter: Payer: Self-pay | Admitting: Pulmonary Disease

## 2021-05-20 DIAGNOSIS — R6510 Systemic inflammatory response syndrome (SIRS) of non-infectious origin without acute organ dysfunction: Secondary | ICD-10-CM

## 2021-05-20 DIAGNOSIS — N179 Acute kidney failure, unspecified: Secondary | ICD-10-CM | POA: Diagnosis not present

## 2021-05-20 DIAGNOSIS — E871 Hypo-osmolality and hyponatremia: Secondary | ICD-10-CM | POA: Diagnosis not present

## 2021-05-20 DIAGNOSIS — G9341 Metabolic encephalopathy: Secondary | ICD-10-CM | POA: Diagnosis not present

## 2021-05-20 DIAGNOSIS — K922 Gastrointestinal hemorrhage, unspecified: Secondary | ICD-10-CM | POA: Diagnosis not present

## 2021-05-20 LAB — CBC WITH DIFFERENTIAL/PLATELET
Abs Immature Granulocytes: 0.12 10*3/uL — ABNORMAL HIGH (ref 0.00–0.07)
Basophils Absolute: 0.1 10*3/uL (ref 0.0–0.1)
Basophils Relative: 1 %
Eosinophils Absolute: 0 10*3/uL (ref 0.0–0.5)
Eosinophils Relative: 0 %
HCT: 31.7 % — ABNORMAL LOW (ref 36.0–46.0)
Hemoglobin: 10.3 g/dL — ABNORMAL LOW (ref 12.0–15.0)
Immature Granulocytes: 1 %
Lymphocytes Relative: 4 %
Lymphs Abs: 0.6 10*3/uL — ABNORMAL LOW (ref 0.7–4.0)
MCH: 34.8 pg — ABNORMAL HIGH (ref 26.0–34.0)
MCHC: 32.5 g/dL (ref 30.0–36.0)
MCV: 107.1 fL — ABNORMAL HIGH (ref 80.0–100.0)
Monocytes Absolute: 1.2 10*3/uL — ABNORMAL HIGH (ref 0.1–1.0)
Monocytes Relative: 7 %
Neutro Abs: 15 10*3/uL — ABNORMAL HIGH (ref 1.7–7.7)
Neutrophils Relative %: 87 %
Platelets: 164 10*3/uL (ref 150–400)
RBC: 2.96 MIL/uL — ABNORMAL LOW (ref 3.87–5.11)
RDW: 19.8 % — ABNORMAL HIGH (ref 11.5–15.5)
WBC: 17 10*3/uL — ABNORMAL HIGH (ref 4.0–10.5)
nRBC: 0 % (ref 0.0–0.2)

## 2021-05-20 LAB — BASIC METABOLIC PANEL
Anion gap: 9 (ref 5–15)
BUN: 30 mg/dL — ABNORMAL HIGH (ref 8–23)
CO2: 28 mmol/L (ref 22–32)
Calcium: 9.1 mg/dL (ref 8.9–10.3)
Chloride: 97 mmol/L — ABNORMAL LOW (ref 98–111)
Creatinine, Ser: 1.03 mg/dL — ABNORMAL HIGH (ref 0.44–1.00)
GFR, Estimated: 55 mL/min — ABNORMAL LOW (ref >60)
Glucose, Bld: 123 mg/dL — ABNORMAL HIGH (ref 70–99)
Potassium: 3.8 mmol/L (ref 3.5–5.1)
Sodium: 134 mmol/L — ABNORMAL LOW (ref 135–145)

## 2021-05-20 LAB — LACTIC ACID, PLASMA: Lactic Acid, Venous: 1.3 mmol/L (ref 0.5–1.9)

## 2021-05-20 LAB — MAGNESIUM: Magnesium: 1.7 mg/dL (ref 1.7–2.4)

## 2021-05-20 LAB — PROCALCITONIN: Procalcitonin: 0.13 ng/mL

## 2021-05-20 LAB — D-DIMER, QUANTITATIVE: D-Dimer, Quant: 2.07 ug/mL-FEU — ABNORMAL HIGH (ref 0.00–0.50)

## 2021-05-20 MED ORDER — IPRATROPIUM-ALBUTEROL 0.5-2.5 (3) MG/3ML IN SOLN
3.0000 mL | Freq: Once | RESPIRATORY_TRACT | Status: AC
  Administered 2021-05-20: 14:00:00 3 mL via RESPIRATORY_TRACT
  Filled 2021-05-20: qty 3

## 2021-05-20 MED ORDER — IOHEXOL 350 MG/ML SOLN
75.0000 mL | Freq: Once | INTRAVENOUS | Status: AC | PRN
  Administered 2021-05-20: 23:00:00 75 mL via INTRAVENOUS

## 2021-05-20 MED ORDER — IPRATROPIUM-ALBUTEROL 0.5-2.5 (3) MG/3ML IN SOLN
3.0000 mL | Freq: Once | RESPIRATORY_TRACT | Status: AC
Start: 2021-05-20 — End: 2021-05-20
  Administered 2021-05-20: 18:00:00 3 mL via RESPIRATORY_TRACT
  Filled 2021-05-20: qty 3

## 2021-05-20 MED ORDER — FLUTICASONE PROPIONATE 50 MCG/ACT NA SUSP
2.0000 | Freq: Every day | NASAL | Status: DC
  Administered 2021-05-20 – 2021-05-22 (×2): 2 via NASAL
  Filled 2021-05-20 (×2): qty 16

## 2021-05-20 MED ORDER — AMPICILLIN-SULBACTAM SODIUM 3 (2-1) G IJ SOLR
3.0000 g | Freq: Four times a day (QID) | INTRAMUSCULAR | Status: DC
  Administered 2021-05-21: 3 g via INTRAVENOUS
  Filled 2021-05-20 (×3): qty 8

## 2021-05-20 MED ORDER — ALBUMIN HUMAN 25 % IV SOLN
25.0000 g | Freq: Once | INTRAVENOUS | Status: AC
  Administered 2021-05-20: 21:00:00 25 g via INTRAVENOUS
  Filled 2021-05-20: qty 100

## 2021-05-20 MED ORDER — FUROSEMIDE 10 MG/ML IJ SOLN
40.0000 mg | Freq: Once | INTRAMUSCULAR | Status: AC
  Administered 2021-05-20: 22:00:00 40 mg via INTRAVENOUS
  Filled 2021-05-20: qty 4

## 2021-05-20 MED ORDER — FUROSEMIDE 10 MG/ML IJ SOLN
40.0000 mg | Freq: Once | INTRAMUSCULAR | Status: AC
  Administered 2021-05-20: 13:00:00 40 mg via INTRAVENOUS
  Filled 2021-05-20: qty 4

## 2021-05-20 MED ORDER — METOPROLOL TARTRATE 5 MG/5ML IV SOLN
5.0000 mg | Freq: Four times a day (QID) | INTRAVENOUS | Status: DC
Start: 2021-05-20 — End: 2021-05-20

## 2021-05-20 MED ORDER — GLYCOPYRROLATE 0.2 MG/ML IJ SOLN
0.1000 mg | Freq: Once | INTRAMUSCULAR | Status: AC
  Administered 2021-05-20: 21:00:00 0.1 mg via INTRAVENOUS
  Filled 2021-05-20: qty 0.5

## 2021-05-20 MED ORDER — GLYCOPYRROLATE 0.2 MG/ML IJ SOLN
0.1000 mg | Freq: Once | INTRAMUSCULAR | Status: AC
  Administered 2021-05-20: 17:00:00 0.1 mg via INTRAVENOUS
  Filled 2021-05-20: qty 1

## 2021-05-20 MED ORDER — METOPROLOL TARTRATE 5 MG/5ML IV SOLN
10.0000 mg | Freq: Four times a day (QID) | INTRAVENOUS | Status: AC
  Administered 2021-05-20 – 2021-05-21 (×3): 10 mg via INTRAVENOUS
  Filled 2021-05-20 (×3): qty 10

## 2021-05-20 NOTE — TOC Progression Note (Addendum)
Transition of Care [facility]) - Progression Note    Patient Details  Name: Angela Sullivan MRN: [id number] Date of Birth: July 03, 1940  Transition of Care [facility]) CM/SW Contact  Caryn Section, RN Phone Number: 05/20/2021, 3:46 PM  Clinical Narrative:   Left message with friend, Vikki Ports to review bed choices.  Awaiting call back  Addendum:  Vikki Ports will be here in AM and will communicate choices.    Expected Discharge Plan: Skilled Nursing Facility Barriers to Discharge: Continued Medical Work up  Expected Discharge Plan and Services Expected Discharge Plan: Skilled Nursing Facility   Discharge Planning Services: CM Consult Post Acute Care Choice: Skilled Nursing Facility Living arrangements for the past 2 months: Single Family Home                 DME Arranged: N/A DME Agency: NA                   Social Determinants of Health (SDOH) Interventions    Readmission Risk Interventions No flowsheet data found.

## 2021-05-20 NOTE — Progress Notes (Addendum)
Physical Therapy Treatment Patient Details Name: Angela Sullivan MRN: [id number] DOB: May 15, 1941 Today's Date: 05/20/2021   History of Present Illness Angela Sullivan is an 80yoF who comes [facility] on 06-06-21 after unwitnessed fall, assisted up by EMS, refused transport to ED, then fell again later in day. Pt down for sustained time in cold environment, 93degrees upon arrival, hyponatremia 114. Bloody stools -> EGD c duodenal ulcers. CK levels on arrival 1185. PMH: HLD, HTN.    PT Comments    Pt sitting tall in bed at entry, more drowsy this morning, seems a bit confused as she has difficulty relaying events from previous day with clarity. Pt open to going OOB to chair.Long sitting to EOB requires notable more effort today than previous day. Pt also more weak coming to standing, requires more physical assist today. Pt has >3 LOB posteriorly during stand pivot to chair. Pt noted to be hypoxic throughout session SpO2 85-86%, breathing notably more wet sounding this date, author moved pt to 2L/min via [place], NSG made aware. Did not advance or attempt any AMB this date due to current state, would not be safe. BUE antebrachial edema much improved compared to previous day, no active weeping seen.      Recommendations for follow up therapy are one component of a multi-disciplinary discharge planning process, led by the attending physician.  Recommendations may be updated based on patient status, additional functional criteria and insurance authorization.  Follow Up Recommendations  Skilled nursing-short term rehab (<3 hours/day)     Assistance Recommended at Discharge Intermittent Supervision/Assistance  Equipment Recommendations  Rolling walker (2 wheels)    Recommendations for Other Services       Precautions / Restrictions Precautions Precautions: Fall Restrictions Weight Bearing Restrictions: No     Mobility  Bed Mobility Overal bed mobility: Needs Assistance Bed Mobility: Supine to Sit      Supine to sit: Supervision;HOB elevated     General bed mobility comments: maximal effort and time, marked decrease in facility this date compared to previous day.    Transfers Overall transfer level: Needs assistance Equipment used: Rolling walker (2 wheels) Transfers: Sit to/from Stand;Bed to chair/wheelchair/BSC Sit to Stand: Min assist Stand pivot transfers: Mod assist         General transfer comment: weaker and less steady with 3+ posterio LOB durign transition to chair    Ambulation/Gait Ambulation/Gait assistance:  (deferred today due to acutely worse weakness and hypoxia)                 Stairs             Wheelchair Mobility    Modified Rankin (Stroke Patients Only)       Balance                                            Cognition Arousal/Alertness:  (more drowsy today, but appears to be as oriented as usual, mild fluctuatign confusion that she isable to self correct)                                              Exercises      General Comments        Pertinent Vitals/Pain      Home Living  Prior Function            PT Goals (current goals can now be found in the care plan section) Acute Rehab PT Goals Patient Stated Goal: regain strength and return to living alone independently PT Goal Formulation: With patient Time For Goal Achievement: 05/29/21 Potential to Achieve Goals: Poor Progress towards PT goals: Not progressing toward goals - comment    Frequency    Min 2X/week      PT Plan Current plan remains appropriate    Co-evaluation              AM-PAC PT "6 Clicks" Mobility   Outcome Measure  Help needed turning from your back to your side while in a flat bed without using bedrails?: A Lot Help needed moving from lying on your back to sitting on the side of a flat bed without using bedrails?: A Lot Help needed moving to and from  a bed to a chair (including a wheelchair)?: A Lot Help needed standing up from a chair using your arms (e.g., wheelchair or bedside chair)?: A Lot Help needed to walk in hospital room?: A Lot Help needed climbing 3-5 steps with a railing? : A Lot 6 Click Score: 12    End of Session Equipment Utilized During Treatment: Gait belt;Oxygen Activity Tolerance: Patient limited by fatigue;Treatment limited secondary to medical complications (Comment) Patient left: in chair;with call bell/phone within reach;with chair alarm set Nurse Communication: Mobility status PT Visit Diagnosis: Unsteadiness on feet (R26.81);Difficulty in walking, not elsewhere classified (R26.2);Other abnormalities of gait and mobility (R26.89);Repeated falls (R29.6);Muscle weakness (generalized) (M62.81)     Time: 4742-5956 PT Time Calculation (min) (ACUTE ONLY): 23 min  Charges:  $Therapeutic Exercise: 23-37 mins                    2:47 PM, 05/20/21 Angela Sullivan, PT, DPT Physical Therapist - [facility]  [phone] (ASCOM)     Angela Sullivan C 05/20/2021, 2:44 PM

## 2021-05-20 NOTE — Progress Notes (Addendum)
PROGRESS NOTE  Angela Sullivan    DOB: Nov 14, 1940, 80 y.o.  YTW:[id number]  PCP: Kandyce Rud, MD   Code Status: DNR   DOA: 04/30/2021   LOS: 10  Brief Narrative of Current Hospitalization  Angela Sullivan is a 80 y.o. female with a PMH significant for HTN, HLD. They presented from home to the ED on 05/12/2021 with syncope. In the ED, it was found that they had hypothermia, hyponatremia, acute renal failure and metabolic encephalopathy.  Additionally, she was found to have bloody stools from duodenal ulcers.  She was admitted to the ICU. They were treated with EGD on 12/20 with epinephrine injection and clipping.  She also received 3 units PRBCs.  Patient was admitted to medicine service for further workup and management of GI bleed, hyponatremia, acute metabolic encephalopathy as outlined in detail below.  05/20/21 -stable, medically ready for discharge to SNF once bed available  Assessment & Plan  Principal Problem:   Upper GI bleed Active Problems:   Hyponatremia   High cholesterol   Hypertension   Fall at home, initial encounter   New onset atrial fibrillation (HCC)   Elevated troponin   Abnormal LFTs   Rhabdomyolysis   Hypokalemia   SIRS (systemic inflammatory response syndrome) (HCC)   Hypothermia   Pressure injury of skin   Acute metabolic encephalopathy   AKI (acute kidney injury) (HCC)   Anasarca  Upper GI bleed- resolved. s/p EGD and duodenal ulcer injection and clipping on 12/20.  S/p 3 units PRBCs.  CBC remained stable.  Hemoglobin yesterday 9.2. -GI has signed off -Avoid NSAIDs -Continue Protonix -PT/OT for deconditioning, recommending SNF  AKI   hypokalemia   hyponatremia   rhabdomyolysis-resolved.  Creatinine at baseline 0.92 yesterday. S/p IV fluids - nephrology has signed off -Avoid nephrotoxic agents - remove foley which was placed while in ICU. Strict I/O and bladder scans PRN to ensure no urinary retention.  Acute metabolic encephalopathy    hypothermia -resolved.  Patient now at her baseline mental status.  New onset Afib- in setting of acute illness. Rate controlled.  - continue metoprolol - f/u cardiology, afib clinic - repeat ECG  HTN- chronic, well controlled - holding home medications since hypotensive on admission  GOC-  - palliative care following, appreciate recs   DVT prophylaxis: SCDs Start: 05/09/2021 1144   Diet:  Diet Orders (From admission, onward)     Start     Ordered   05/14/21 1226  DIET SOFT Room service appropriate? Yes; Fluid consistency: Thin  Diet effective now       Question Answer Comment  Room service appropriate? Yes   Fluid consistency: Thin      05/14/21 1225            Subjective 05/20/21    Pt reports no complaints today. She is ready to go to SNF. Asks that we update her friend.   Disposition Plan & Communication  Patient status: Inpatient  Admitted From: Home Disposition: Skilled nursing facility Anticipated discharge date: 12/29  Family Communication: friend  Consults, Procedures, Significant Events  Consultants:  GI CCM nephrology  Procedures/significant events:  EGD 12/20  Antimicrobials:  Anti-infectives (From admission, onward)    None       Objective   Vitals:   05/19/21 1537 05/19/21 2005 05/20/21 0008 05/20/21 0442  BP: 131/85 (!) 150/64 138/73 133/70  Pulse: 100 99 93 92  Resp: 18 18 18 18   Temp: 98.7 F (37.1 C) 97.8 F (36.6 C) 98 F (  36.7 C) (!) 97.5 F (36.4 C)  TempSrc:      SpO2: 100% 96% 95% 93%  Weight:      Height:        Intake/Output Summary (Last 24 hours) at 05/20/2021 0706 Last data filed at 05/20/2021 0447 Gross per 24 hour  Intake --  Output 3200 ml  Net -3200 ml   Filed Weights   05/14/21 0500 05/15/21 0300 05/16/21 0438  Weight: 93 kg 91.2 kg 93.2 kg    Patient BMI: Body mass index is 37.58 kg/m.   Physical Exam:  General: awake, alert, NAD Respiratory: normal respiratory effort. Stertor present.  Positive rhonchi Cardiovascular: normal S1/S2, RRR, no JVD, murmurs, quick capillary refill  Nervous: A&O x3. no gross focal neurologic deficits, normal speech Extremities: moves all equally, no edema, normal tone Skin: dry, intact, normal temperature, normal color. No rashes, lesions or ulcers on exposed skin Psychiatry: normal mood, congruent affect  Labs   I have personally reviewed following labs and imaging studies CBC    Component Value Date/Time   WBC 9.6 05/19/2021 0428   RBC 2.66 (L) 05/19/2021 0428   HGB 9.2 (L) 05/19/2021 0428   HCT 28.0 (L) 05/19/2021 0428   PLT 120 (L) 05/19/2021 0428   MCV 105.3 (H) 05/19/2021 0428   MCH 34.6 (H) 05/19/2021 0428   MCHC 32.9 05/19/2021 0428   RDW 19.9 (H) 05/19/2021 0428   LYMPHSABS 1.0 04/30/2021 0928   MONOABS 1.2 (H) 04/25/2021 0928   EOSABS 0.0 04/23/2021 0928   BASOSABS 0.0 04/28/2021 0928   BMP Latest Ref Rng & Units 05/19/2021 05/17/2021 05/16/2021  Glucose 70 - 99 mg/dL 98 960(A) 540(J)  BUN 8 - 23 mg/dL 81(X) 91(Y) 78(G)  Creatinine 0.44 - 1.00 mg/dL 9.56 2.13 0.86  Sodium 135 - 145 mmol/L 138 134(L) 133(L)  Potassium 3.5 - 5.1 mmol/L 3.5 4.1 4.0  Chloride 98 - 111 mmol/L 104 106 108  CO2 22 - 32 mmol/L 25 21(L) 20(L)  Calcium 8.9 - 10.3 mg/dL 5.7(Q) 4.6(N) 6.2(X)   Imaging Studies  No results found.  Medications   Scheduled Meds:  sodium chloride   Intravenous Once   chlorhexidine  15 mL Mouth Rinse BID   Chlorhexidine Gluconate Cloth  6 each Topical Q0600   feeding supplement  237 mL Oral TID BM   folic acid  1 mg Oral Daily   furosemide  40 mg Oral Daily   mouth rinse  15 mL Mouth Rinse q12n4p   metoprolol tartrate  6.25 mg Oral BID   multivitamin with minerals  1 tablet Oral Daily   pantoprazole  40 mg Oral BID   polyethylene glycol  17 g Oral Daily   No recently discontinued medications to reconcile  LOS: 10 days   Leeroy Bock, DO [facility] 05/20/2021, 7:06 AM   Available by Epic  secure chat 7AM-7PM. If 7PM-7AM, please contact night-coverage Refer to [email or web address] to contact the [facility] Attending or Consulting provider for this pt

## 2021-05-20 NOTE — Consult Note (Signed)
Pharmacy Antibiotic Note  Angela Sullivan is a 80 y.o. female admitted on 05/13/2021 with UGIB, and new c/f aspiration pneumonia.  Pharmacy has been consulted for Unasyn dosing.  Plan: 12/28 WBC 9.6>17; PCT 0.13; low grade fever. 12/28 CT imaging --> Moderate right and trace left pleural effusions with partial consolidation of the RLL which may represent compressive atelectasis or PNA.  Initiate unasyn 3g IV q6h   Height: 5\' 2"  (157.5 cm) Weight: 93.2 kg (205 lb 7.5 oz) IBW/kg (Calculated) : 50.1  Temp (24hrs), Avg:98.1 F (36.7 C), Min:97.3 F (36.3 C), Max:99.5 F (37.5 C)  Recent Labs  Lab 05/15/21 0342 05/16/21 0540 05/17/21 0452 05/19/21 0428 05/20/21 2038  WBC 11.7* 9.3 10.5 9.6 17.0*  CREATININE 1.08* 0.96 0.84 0.92 1.03*  LATICACIDVEN 1.0  --   --   --  1.3    Estimated Creatinine Clearance: 45.5 mL/min (A) (by C-G formula based on SCr of 1.03 mg/dL (H)).    No Known Allergies  Antimicrobials this admission: Unasyn (12/28 >>   Dose adjustments this admission: CTM adjust PRN  Microbiology results: 12/28 No new micro  12/18 BCx: NGTD 12/18 UCx: NGTD  12/18 flu/Cov: negative  12/18 MRSA PCR: negative  Thank you for allowing pharmacy to be a part of this patients care.  1/19 Angela Sullivan 05/20/2021 11:08 PM

## 2021-05-20 NOTE — Care Management Important Message (Signed)
Important Message  Patient Details  Name: Angela Sullivan MRN: [id number] Date of Birth: Nov 12, 1940   Medicare Important Message Given:  Yes  I reviewed the Important Message from Medicare with her but she was unable to sign. I left a copy with her on her bedside table. I wished her well and thanked her for her time.  Angela Sullivan 05/20/2021, 11:10 AM

## 2021-05-20 NOTE — Progress Notes (Addendum)
° °  Patient - Angela Sullivan MRN [id number] DOB 12-Apr-1941   Subjective: Nurse report patient transfer from 2C at shift change and has A fib on monitor with increased resp rate, signifcan secretions. Per RT patient would not leave BIPAP in place when tried earlier on previous shift  Objective: Vitals:   05/20/21 1900 05/20/21 2106  BP: 136/75 105/70  Pulse: (!) 107 (!) 106  Resp: (!) 30 20  Temp: 98.6 F (37 C) 98 F (36.7 C)  SpO2: 96% 99%   Chest xray done prior to event 12/28 1753 pm FINDINGS: Moderate enlargement of the cardiopericardial silhouette noted withatherosclerotic calcification of the aortic arch. Hazy density inthe right mid lung potentially from fluid or atelectasis within oralong the minor fissure. Mild right retrocardiac airspace opacityalong with accentuated density along the right diaphragm. The leftlung appears clear. Biapical calcifications, likely chronicpostinflammatory. 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   IMPRESSION: 1. Indistinct band of airspace opacity in the right midlung with right retrocardiac and right retro diaphragmatic airspace opacity. This could be from pleural effusion with associated atelectasis; pneumonia is a differential diagnostic consideration. 2. Moderate enlargement of the cardiopericardial silhouette, without edema. 3.  Aortic Atherosclerosis (ICD10-I70.0). 4. Biapical pleuroparenchymal scarring with calcifications. 5. Asymmetric left degenerative glenohumeral arthropathy    Assessment:  Neuro - answers question appropriately - knows name and that she is in hospital. CV atrial fib with rate 100-110 +JVD anasarca Resp - rate 28-32/ min but denies shortness of breath. Course crackles mid chest with diminished bases. Exam limited with body habitus sats 96% with O2 2L via [place]  Labs ordered: CBC. Ddimer. Procal, bmp lactic acid and mag  Procal 0.13 Lactic acid 1.3 BMP Na 134, K 3.8 Cl 123,  CO2 26 creat  1.03.  Ca 9.1, Anion gap 9, Mag 1.7 D dimer 2.07  CBC results compared to prior draw on 12/27 at 0428   WBC significantly elevated  17.0  previously 9.6,  RBC 2.96  not significant change from 2.66 HGB 10.3 improved from 9.2 Platelets improved 164 from 120    .Plan: Meets sepsis criteria with tachycardia and increased resp rate  - elvated white count. Lactic acid normal and BP low normal  could be reactive or recent aspiration given location of right midlung opacity - starting unasyn and will follow up xray with CT given elevated Ddimer, recent immobilization and inability for VTE prophylaxis with new onset a fib Fluid volume overload - exam findings with crackles and JVD - alumin (albumin on labs day prior 2.7, followed by lasix Right lung Respiratory failure - support with BIPAP, as above unasyn, lasix, follow up imaging with CTA  Update CTA  IMPRESSION: 1. No CT evidence of central pulmonary artery embolus. 2. Moderate right and trace left pleural effusions with partial consolidation of the right lower lobe which may represent compressive atelectasis or pneumonia. Underlying mass is not excluded clinical correlation and follow-up to resolution recommended. 3. Mild cardiomegaly with evidence of right heart dysfunction. Correlation with echocardiogram recommended. 4. Small ascites and diffuse subcutaneous edema. 5. Aortic Atherosclerosis (ICD10-I70.0).  Echo ordered Expand antibiotic coverage Continue bipap as able room close to nurse station - sitter to keep patient from removing bipap if needed ABG

## 2021-05-21 ENCOUNTER — Inpatient Hospital Stay (HOSPITAL_COMMUNITY)
Admit: 2021-05-21 | Discharge: 2021-05-21 | Disposition: A | Discharge status: Home / Self Care | Payer: Medicare Other | Attending: Acute Care | Admitting: Acute Care

## 2021-05-21 DIAGNOSIS — I351 Nonrheumatic aortic (valve) insufficiency: Secondary | ICD-10-CM

## 2021-05-21 DIAGNOSIS — J9601 Acute respiratory failure with hypoxia: Secondary | ICD-10-CM

## 2021-05-21 DIAGNOSIS — E871 Hypo-osmolality and hyponatremia: Secondary | ICD-10-CM | POA: Diagnosis not present

## 2021-05-21 DIAGNOSIS — K922 Gastrointestinal hemorrhage, unspecified: Secondary | ICD-10-CM | POA: Diagnosis not present

## 2021-05-21 DIAGNOSIS — I340 Nonrheumatic mitral (valve) insufficiency: Secondary | ICD-10-CM | POA: Diagnosis not present

## 2021-05-21 DIAGNOSIS — I350 Nonrheumatic aortic (valve) stenosis: Secondary | ICD-10-CM | POA: Diagnosis not present

## 2021-05-21 DIAGNOSIS — I361 Nonrheumatic tricuspid (valve) insufficiency: Secondary | ICD-10-CM

## 2021-05-21 DIAGNOSIS — N179 Acute kidney failure, unspecified: Secondary | ICD-10-CM | POA: Diagnosis not present

## 2021-05-21 DIAGNOSIS — G9341 Metabolic encephalopathy: Secondary | ICD-10-CM | POA: Diagnosis not present

## 2021-05-21 LAB — BLOOD GAS, ARTERIAL
Acid-Base Excess: 5.2 mmol/L — ABNORMAL HIGH (ref 0.0–2.0)
Bicarbonate: 35.2 mmol/L — ABNORMAL HIGH (ref 20.0–28.0)
Delivery systems: POSITIVE
Expiratory PAP: 5
FIO2: 50
Inspiratory PAP: 12
O2 Saturation: 99.2 %
Patient temperature: 37
RATE: 8 resp/min
pCO2 arterial: 88 mmHg (ref 32.0–48.0)
pH, Arterial: 7.21 — ABNORMAL LOW (ref 7.350–7.450)
pO2, Arterial: 164 mmHg — ABNORMAL HIGH (ref 83.0–108.0)

## 2021-05-21 LAB — ECHOCARDIOGRAM COMPLETE
AR max vel: 0.42 cm2
AV Area VTI: 0.45 cm2
AV Area mean vel: 0.4 cm2
AV Mean grad: 41 mmHg
AV Peak grad: 70.4 mmHg
Ao pk vel: 4.2 m/s
Area-P 1/2: 6.43 cm2
Height: 62 in
MV VTI: 1.05 cm2
S' Lateral: 2.9 cm
Weight: 3287.5 oz

## 2021-05-21 LAB — BASIC METABOLIC PANEL
Anion gap: 13 (ref 5–15)
BUN: 32 mg/dL — ABNORMAL HIGH (ref 8–23)
CO2: 27 mmol/L (ref 22–32)
Calcium: 8.7 mg/dL — ABNORMAL LOW (ref 8.9–10.3)
Chloride: 97 mmol/L — ABNORMAL LOW (ref 98–111)
Creatinine, Ser: 1.05 mg/dL — ABNORMAL HIGH (ref 0.44–1.00)
GFR, Estimated: 53 mL/min — ABNORMAL LOW (ref >60)
Glucose, Bld: 105 mg/dL — ABNORMAL HIGH (ref 70–99)
Potassium: 4 mmol/L (ref 3.5–5.1)
Sodium: 137 mmol/L (ref 135–145)

## 2021-05-21 LAB — CBC
HCT: 29.7 % — ABNORMAL LOW (ref 36.0–46.0)
Hemoglobin: 9.4 g/dL — ABNORMAL LOW (ref 12.0–15.0)
MCH: 34.8 pg — ABNORMAL HIGH (ref 26.0–34.0)
MCHC: 31.6 g/dL (ref 30.0–36.0)
MCV: 110 fL — ABNORMAL HIGH (ref 80.0–100.0)
Platelets: 145 10*3/uL — ABNORMAL LOW (ref 150–400)
RBC: 2.7 MIL/uL — ABNORMAL LOW (ref 3.87–5.11)
RDW: 19.9 % — ABNORMAL HIGH (ref 11.5–15.5)
WBC: 19.9 10*3/uL — ABNORMAL HIGH (ref 4.0–10.5)
nRBC: 0 % (ref 0.0–0.2)

## 2021-05-21 LAB — PROCALCITONIN: Procalcitonin: 0.24 ng/mL

## 2021-05-21 LAB — MRSA NEXT GEN BY PCR, NASAL: MRSA by PCR Next Gen: NOT DETECTED

## 2021-05-21 LAB — GLUCOSE, CAPILLARY: Glucose-Capillary: 93 mg/dL (ref 70–99)

## 2021-05-21 MED ORDER — SODIUM CHLORIDE 0.9 % IV SOLN
100.0000 mg | Freq: Two times a day (BID) | INTRAVENOUS | Status: DC
  Filled 2021-05-21 (×2): qty 100

## 2021-05-21 MED ORDER — VANCOMYCIN HCL 2000 MG/400ML IV SOLN
2000.0000 mg | Freq: Once | INTRAVENOUS | Status: AC
  Administered 2021-05-21: 04:00:00 2000 mg via INTRAVENOUS
  Filled 2021-05-21: qty 400

## 2021-05-21 MED ORDER — PANTOPRAZOLE SODIUM 40 MG IV SOLR
40.0000 mg | Freq: Two times a day (BID) | INTRAVENOUS | Status: DC
  Administered 2021-05-21 – 2021-05-23 (×5): 40 mg via INTRAVENOUS
  Filled 2021-05-21 (×5): qty 40

## 2021-05-21 MED ORDER — CEFEPIME HCL 2 G IJ SOLR
2.0000 g | Freq: Two times a day (BID) | INTRAMUSCULAR | Status: DC
Start: 2021-05-21 — End: 2021-05-23
  Administered 2021-05-21 – 2021-05-23 (×5): 2 g via INTRAVENOUS
  Filled 2021-05-21 (×7): qty 2

## 2021-05-21 MED ORDER — IPRATROPIUM-ALBUTEROL 0.5-2.5 (3) MG/3ML IN SOLN: 3.0000 mL | Freq: Four times a day (QID) | RESPIRATORY_TRACT | Status: DC | PRN

## 2021-05-21 MED ORDER — VANCOMYCIN HCL 750 MG/150ML IV SOLN
750.0000 mg | INTRAVENOUS | Status: DC
Start: 2021-05-22 — End: 2021-05-21

## 2021-05-21 MED ORDER — SODIUM CHLORIDE 0.9 % IV SOLN
1.0000 g | INTRAVENOUS | Status: DC
  Administered 2021-05-21: 06:00:00 1 g via INTRAVENOUS
  Filled 2021-05-21: qty 10

## 2021-05-21 NOTE — Progress Notes (Signed)
PROGRESS NOTE  Angela Sullivan    DOB: [birth year], 80 y.o.  ASN:[id number]  PCP: Kandyce Rud, MD   Code Status: DNR   DOA: 06/07/2021   LOS: 11  Brief Narrative of Current Hospitalization  Angela Sullivan is a 80 y.o. female with a PMH significant for HTN, HLD. They presented from home to the ED on 2021-06-07 with syncope. In the ED, it was found that they had hypothermia, hyponatremia, acute renal failure and metabolic encephalopathy.  Additionally, she was found to have bloody stools from duodenal ulcers.  She was admitted to the ICU. They were treated with EGD on 12/20 with epinephrine injection and clipping.  She also received 3 units PRBCs.  Patient was admitted to medicine service for further workup and management of GI bleed, hyponatremia, acute metabolic encephalopathy as outlined in detail below.  05/21/21 -developed acute respiratory failure yesterday afternoon requiring bipap and transfer to progressive floor  Assessment & Plan  Principal Problem:   Upper GI bleed Active Problems:   Hyponatremia   High cholesterol   Hypertension   Fall at home, initial encounter   New onset atrial fibrillation (HCC)   Elevated troponin   Abnormal LFTs   Rhabdomyolysis   Hypokalemia   SIRS (systemic inflammatory response syndrome) (HCC)   Hypothermia   Pressure injury of skin   Acute metabolic encephalopathy   AKI (acute kidney injury) (HCC)   Anasarca  New onset acute respiratory failure with hypoxia- CTA negative for PE. Chest imaging non-specific but suggestive of atelectasis vs pneumonia vs congestion. Given hypervolemic exam and rhonchi, will continue diuretics as well as antibiotics.  - ABG pending - continue Bipap PRN and wean oxygen as tolerated - continue IV antibiotics - IV diuresis - strict I/O  Upper GI bleed- resolved. s/p EGD and duodenal ulcer injection and clipping on 12/20.  S/p 3 units PRBCs. Hgb generally stable 9.2>10.3>9.4 -GI has signed off -Avoid  NSAIDs -Continue Protonix -PT/OT for deconditioning, recommending SNF - CBC am  AKI   hypokalemia   hyponatremia   rhabdomyolysis-resolved.  Creatinine at baseline - nephrology has signed off -Avoid nephrotoxic agents - strict I/O  Acute metabolic encephalopathy   hypothermia -resolved.  Patient now at her baseline mental status.  New onset Afib- in setting of acute illness. Rate controlled. Echo pending. Anticoagulation being held with acute GI bleed. - continue metoprolol - f/u cardiology, afib clinic - repeat ECG  HTN- chronic, well controlled - holding home medications since hypotensive on admission  GOC-  - palliative care following, appreciate recs   DVT prophylaxis: SCDs Start: 2021-06-07 1144   Diet:  Diet Orders (From admission, onward)     Start     Ordered   05/14/21 1226  DIET SOFT Room service appropriate? Yes; Fluid consistency: Thin  Diet effective now       Question Answer Comment  Room service appropriate? Yes   Fluid consistency: Thin      05/14/21 1225            Subjective 05/21/21    Pt reports feeling improved from a respiratory standpoint. Difficult to give her history given bipap in place. She denies any needs at this time and is able to follow commands on exam.   Disposition Plan & Communication  Patient status: Inpatient  Admitted From: Home Disposition: Skilled nursing facility Anticipated discharge date: TBD  Family Communication: friend  Consults, Procedures, Significant Events  Consultants:  GI CCM nephrology  Procedures/significant events:  EGD 12/20  Antimicrobials:  Anti-infectives (From admission, onward)    Start     Dose/Rate Route Frequency Ordered Stop   05/22/21 0415  vancomycin (VANCOREADY) IVPB 750 mg/150 mL       See Hyperspace for full Linked Orders Report.   750 mg 150 mL/hr over 60 Minutes Intravenous Every 24 hours 05/21/21 0315     05/21/21 0800  ceFEPIme (MAXIPIME) 2 g in sodium chloride 0.9 % 100 mL  IVPB        2 g 200 mL/hr over 30 Minutes Intravenous Every 12 hours 05/21/21 0307     05/21/21 0600  cefTRIAXone (ROCEPHIN) 1 g in sodium chloride 0.9 % 100 mL IVPB        1 g 200 mL/hr over 30 Minutes Intravenous Every 24 hours 05/21/21 0202     05/21/21 0400  vancomycin (VANCOREADY) IVPB 2000 mg/400 mL       See Hyperspace for full Linked Orders Report.   2,000 mg 200 mL/hr over 120 Minutes Intravenous  Once 05/21/21 0315 05/21/21 0549   05/21/21 0230  doxycycline (VIBRAMYCIN) 100 mg in sodium chloride 0.9 % 250 mL IVPB  Status:  Discontinued        100 mg 125 mL/hr over 120 Minutes Intravenous Every 12 hours 05/21/21 0134 05/21/21 0205   05/20/21 2307  Ampicillin-Sulbactam (UNASYN) 3 g in sodium chloride 0.9 % 100 mL IVPB  Status:  Discontinued        3 g 200 mL/hr over 30 Minutes Intravenous Every 6 hours 05/20/21 2307 05/21/21 0202       Objective   Vitals:   05/20/21 2329 05/21/21 0355 05/21/21 0725 05/21/21 0726  BP: 131/74   (!) 133/93  Pulse: 98  92 87  Resp: (!) 23  (!) 29 17  Temp: 97.9 F (36.6 C) 97.8 F (36.6 C)  97.9 F (36.6 C)  TempSrc:    Axillary  SpO2: 92%   97%  Weight:      Height:        Intake/Output Summary (Last 24 hours) at 05/21/2021 0750 Last data filed at 05/21/2021 1324 Gross per 24 hour  Intake 240 ml  Output 1500 ml  Net -1260 ml    Filed Weights   05/14/21 0500 05/15/21 0300 05/16/21 0438  Weight: 93 kg 91.2 kg 93.2 kg    Patient BMI: Body mass index is 37.58 kg/m.   Physical Exam:  General: awake, alert, NAD Respiratory: normal respiratory effort. Improved rhonchi from yesterday. Bipap in place. Breath sounds  to bases bilaterally. Cardiovascular: normal S1/S2, RRR, no JVD, murmurs, quick capillary refill  Nervous: A&O x3. no gross focal neurologic deficits Extremities: moves all equally, no LE edema, normal tone. Bilateral UE pitting edema Skin: dry, intact, normal temperature, normal color. No rashes, lesions or ulcers on  exposed skin Psychiatry: normal mood, congruent affect  Labs   I have personally reviewed following labs and imaging studies CBC    Component Value Date/Time   WBC 19.9 (H) 05/21/2021 0650   RBC 2.70 (L) 05/21/2021 0650   HGB 9.4 (L) 05/21/2021 0650   HCT 29.7 (L) 05/21/2021 0650   PLT 145 (L) 05/21/2021 0650   MCV 110.0 (H) 05/21/2021 0650   MCH 34.8 (H) 05/21/2021 0650   MCHC 31.6 05/21/2021 0650   RDW 19.9 (H) 05/21/2021 0650   LYMPHSABS 0.6 (L) 05/20/2021 2038   MONOABS 1.2 (H) 05/20/2021 2038   EOSABS 0.0 05/20/2021 2038   BASOSABS 0.1 05/20/2021 2038   BMP Latest Ref  Rng & Units 05/21/2021 05/20/2021 05/19/2021  Glucose 70 - 99 mg/dL 956(O) 130(Q) 98  BUN 8 - 23 mg/dL 65(H) 84(O) 96(E)  Creatinine 0.44 - 1.00 mg/dL 9.52(W) 4.13(K) 4.40  Sodium 135 - 145 mmol/L 137 134(L) 138  Potassium 3.5 - 5.1 mmol/L 4.0 3.8 3.5  Chloride 98 - 111 mmol/L 97(L) 97(L) 104  CO2 22 - 32 mmol/L 27 28 25   Calcium 8.9 - 10.3 mg/dL ) 9.1 1.0(U)   Imaging Studies  CT Angio Chest Pulmonary Embolism (PE) W or WO Contrast  Result Date: 05/20/2021 CLINICAL DATA:  Concern for pulmonary embolism. EXAM: CT ANGIOGRAPHY CHEST WITH CONTRAST TECHNIQUE: Multidetector CT imaging of the chest was performed using the standard protocol during bolus administration of intravenous contrast. Multiplanar CT image reconstructions and MIPs were obtained to evaluate the vascular anatomy. CONTRAST:  11mL OMNIPAQUE IOHEXOL 350 MG/ML SOLN COMPARISON:  Chest radiograph dated 05/20/2021. FINDINGS: Cardiovascular: Mild cardiomegaly. No pericardial effusion. Three-vessel coronary vascular calcification. There is retrograde flow of contrast from the right atrium into the IVC suggestive of right heart dysfunction. Correlation with echocardiogram recommended. There is advanced atherosclerotic calcification of the thoracic aorta. No aneurysmal dilatation. Evaluation of the pulmonary arteries is limited due to respiratory motion  artifact and suboptimal visualization of the peripheral branches. No large central pulmonary artery embolus identified. Mediastinum/Nodes: No hilar or mediastinal adenopathy. The esophagus is grossly unremarkable. No mediastinal fluid collection. Lungs/Pleura: Moderate right and trace left pleural effusions. There is partial consolidation of the right lower lobe which may represent compressive atelectasis or pneumonia. Underlying mass is not excluded clinical correlation and follow-up to resolution recommended. Biapical subpleural calcified plaques. No pneumothorax. The central airways are patent. Upper Abdomen: Small ascites. Musculoskeletal: Diffuse subcutaneous edema. Osteopenia with degenerative changes of the spine. No acute osseous pathology. Review of the MIP images confirms the above findings. IMPRESSION: 1. No CT evidence of central pulmonary artery embolus. 2. Moderate right and trace left pleural effusions with partial consolidation of the right lower lobe which may represent compressive atelectasis or pneumonia. Underlying mass is not excluded clinical correlation and follow-up to resolution recommended. 3. Mild cardiomegaly with evidence of right heart dysfunction. Correlation with echocardiogram recommended. 4. Small ascites and diffuse subcutaneous edema. 5. Aortic Atherosclerosis (ICD10-I70.0). Electronically Signed   By: 05/22/2021 M.D.   On: 05/20/2021 23:06   DG Chest Port 1 View  Result Date: 05/20/2021 CLINICAL DATA:  Shortness of breath EXAM: PORTABLE CHEST 1 VIEW COMPARISON:  07/13/2020 FINDINGS: Moderate enlargement of the cardiopericardial silhouette noted with atherosclerotic calcification of the aortic arch. Hazy density in the right mid lung potentially from fluid or atelectasis within or along the minor fissure. Mild right retrocardiac airspace opacity along with accentuated density along the right diaphragm. The left lung appears clear. Biapical calcifications, likely chronic  postinflammatory. 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 IMPRESSION: 1. Indistinct band of airspace opacity in the right midlung with right retrocardiac and right retro diaphragmatic airspace opacity. This could be from pleural effusion with associated atelectasis; pneumonia is a differential diagnostic consideration. 2. Moderate enlargement of the cardiopericardial silhouette, without edema. 3.  Aortic Atherosclerosis (ICD10-I70.0). 4. Biapical pleuroparenchymal scarring with calcifications. 5. Asymmetric left degenerative glenohumeral arthropathy Electronically Signed   By: 07/15/2020 M.D.   On: 05/20/2021 18:01    Medications   Scheduled Meds:  sodium chloride   Intravenous Once   chlorhexidine  15 mL Mouth Rinse BID   Chlorhexidine Gluconate Cloth  6 each Topical Q0600  feeding supplement  237 mL Oral TID BM   fluticasone  2 spray Each Nare Daily   folic acid  1 mg Oral Daily   furosemide  40 mg Oral Daily   mouth rinse  15 mL Mouth Rinse q12n4p   multivitamin with minerals  1 tablet Oral Daily   pantoprazole  40 mg Oral BID   polyethylene glycol  17 g Oral Daily   No recently discontinued medications to reconcile  LOS: 11 days   Leeroy Bock, DO [facility] 05/21/2021, 7:50 AM   Available by Epic secure chat 7AM-7PM. If 7PM-7AM, please contact night-coverage Refer to [email or web address] to contact the [name] Attending or Consulting provider for this pt

## 2021-05-21 NOTE — Progress Notes (Signed)
PT Cancellation Note  Patient Details Name: Angela Sullivan MRN: [id number] DOB: [birth year]   Cancelled Treatment:    Reason Eval/Treat Not Completed: Patient not medically ready (Pt transfered to PCU previous day due to decline in respiratory status. Pt has received new order to continue therapy. Per NSG, pt requiring continuous bipap for saturations. Will defer services to later date/time.)  3:04 PM, 05/21/21 Rosamaria Lints, PT, DPT Physical Therapist - [facility]  [phone] (ASCOM)    Haylen Bellotti C 05/21/2021, 3:04 PM

## 2021-05-21 NOTE — Progress Notes (Signed)
Received care of this patient from previous shift. Patient had just been transferred to unit, telemetry placed on pt, O2 @ 2L Angela Sullivan. Pt has increased work of breathing, tachypneic. BBS w/coarse rhonchi & diminished bases. Shows afib on telemetry. Will make on call provider aware of current conditions.

## 2021-05-21 NOTE — TOC Progression Note (Addendum)
Transition of Care [facility]) - Progression Note    Patient Details  Name: Mickala Laton MRN: [id number] Date of Birth: 1940-10-11  Transition of [facility]) CM/SW Contact  Eileen Stanford, LCSW Phone Number: 05/21/2021, 9:49 AM  Clinical Narrative:   CSW spoke with Luanna Salk to discuss choice. Luanna Salk states she does not have a computer but that [facility] is helping her look at the options and they will determine a choice today. Mateo Flow states she is on the way to the hospital and would like some assistance setting up pt's insurance card.  10:08- CSW met with Valeria at bedside. She was asking if CSW could help her activate pt's [facility] card that came in the mail. CSW called the number aside Mateo Flow and followed the prompts to activate the card. Mateo Flow said that [facility] was looking at the bed offers today and for CSW to follow up with Mateo Flow this afternoon and she should have a choice.   Expected Discharge Plan: [place] Barriers to Discharge: Continued Medical Work up  Expected Discharge Plan and Services Expected Discharge Plan: [place]   Discharge Planning Services: CM Consult Post Acute Care Choice: [place] Living arrangements for the past 2 months: Single Family Home                 DME Arranged: N/A DME Agency: NA                   Social Determinants of Health (SDOH) Interventions    Readmission Risk Interventions No flowsheet data found.

## 2021-05-21 NOTE — Progress Notes (Signed)
*  PRELIMINARY RESULTS* Echocardiogram 2D Echocardiogram has been performed.  Cristela Blue 05/21/2021, 1:02 PM

## 2021-05-21 NOTE — Progress Notes (Signed)
Pharmacy Antibiotic Note  Angela Sullivan is a 80 y.o. female admitted on 2021/06/05 with pneumonia.  Pharmacy has been consulted to dose Cefepime for HCAP and Vancomycin for sepsis.  Pt also currently on Ceftriaxone for double coverage for pneumonia.  Plan: Cefepime 2 gm q12h per indication and renal function.  Vancomycin Ordered initial dose of Vancomycin 2 gm Vancomycin 750 mg IV Q 24 hrs.  Goal AUC 400-550. Expected AUC: 494.9 SCr used: 1.03, Vd used: 0.5, BMI 37.58  Pharmacy will continue to follow and adjust abx dosing if warranted.   Height: 5\' 2"  (157.5 cm) Weight: 93.2 kg (205 lb 7.5 oz) IBW/kg (Calculated) : 50.1  Temp (24hrs), Avg:98.1 F (36.7 C), Min:97.3 F (36.3 C), Max:99.5 F (37.5 C)  Recent Labs  Lab 05/15/21 0342 05/16/21 0540 05/17/21 0452 05/19/21 0428 05/20/21 2038  WBC 11.7* 9.3 10.5 9.6 17.0*  CREATININE 1.08* 0.96 0.84 0.92 1.03*  LATICACIDVEN 1.0  --   --   --  1.3    Estimated Creatinine Clearance: 45.5 mL/min (A) (by C-G formula based on SCr of 1.03 mg/dL (H)).    No Known Allergies  Antimicrobials this admission: 12/28 Unasyn >> 12/29 12/29 Ceftriaxone >>  12/29 Cefepime >> 12/29 Vancomycin >>  Microbiology results: No new lab cx ordered or pending since 12/18 at this time  Thank you for allowing pharmacy to be a part of this patients care.  1/19, PharmD, MBA 05/21/2021 3:20 AM

## 2021-05-21 NOTE — Progress Notes (Signed)
Patient removed from Bipap this morning and placed on 4lpm via [place].  Able to maintain SPO2 > 90%.  Completed CPT via chest vest without incident.  After completing CPT, SpO2 dropped to 69% with good pleth.  Patient coached on coughing, but cough is weak and ineffective.  Placed back on Bipap to recover SpO2.

## 2021-05-21 NOTE — Progress Notes (Signed)
OT Cancellation Note  Patient Details Name: Angela Sullivan MRN: [id number] DOB: 24-Dec-1940   Cancelled Treatment:    Reason Eval/Treat Not Completed: Medical issues which prohibited therapy. Pt had an upgrade in care, requires new OT orders. Furthermore, Per RN pt is on continuous BiPap, will hold and reattempt as able.   Oleta Mouse, OTD OTR/L  05/21/21, 2:24 PM

## 2021-05-21 NOTE — Progress Notes (Signed)
Pt was transported to 2A from the ED while on BIPAP.

## 2021-05-22 ENCOUNTER — Encounter: Payer: Self-pay | Admitting: Pulmonary Disease

## 2021-05-22 DIAGNOSIS — R0602 Shortness of breath: Secondary | ICD-10-CM

## 2021-05-22 DIAGNOSIS — I350 Nonrheumatic aortic (valve) stenosis: Secondary | ICD-10-CM

## 2021-05-22 DIAGNOSIS — G9341 Metabolic encephalopathy: Secondary | ICD-10-CM | POA: Diagnosis not present

## 2021-05-22 DIAGNOSIS — E871 Hypo-osmolality and hyponatremia: Secondary | ICD-10-CM | POA: Diagnosis not present

## 2021-05-22 DIAGNOSIS — K922 Gastrointestinal hemorrhage, unspecified: Secondary | ICD-10-CM | POA: Diagnosis not present

## 2021-05-22 DIAGNOSIS — J9601 Acute respiratory failure with hypoxia: Secondary | ICD-10-CM | POA: Diagnosis not present

## 2021-05-22 DIAGNOSIS — D72829 Elevated white blood cell count, unspecified: Secondary | ICD-10-CM

## 2021-05-22 LAB — GLUCOSE, CAPILLARY
Glucose-Capillary: 102 mg/dL — ABNORMAL HIGH (ref 70–99)
Glucose-Capillary: 108 mg/dL — ABNORMAL HIGH (ref 70–99)
Glucose-Capillary: 113 mg/dL — ABNORMAL HIGH (ref 70–99)
Glucose-Capillary: 92 mg/dL (ref 70–99)

## 2021-05-22 LAB — CBC
HCT: 28.7 % — ABNORMAL LOW (ref 36.0–46.0)
Hemoglobin: 8.9 g/dL — ABNORMAL LOW (ref 12.0–15.0)
MCH: 35.6 pg — ABNORMAL HIGH (ref 26.0–34.0)
MCHC: 31 g/dL (ref 30.0–36.0)
MCV: 114.8 fL — ABNORMAL HIGH (ref 80.0–100.0)
Platelets: 172 10*3/uL (ref 150–400)
RBC: 2.5 MIL/uL — ABNORMAL LOW (ref 3.87–5.11)
RDW: 19.5 % — ABNORMAL HIGH (ref 11.5–15.5)
WBC: 27.6 10*3/uL — ABNORMAL HIGH (ref 4.0–10.5)
nRBC: 0.2 % (ref 0.0–0.2)

## 2021-05-22 LAB — BLOOD GAS, VENOUS
Acid-Base Excess: 5.2 mmol/L — ABNORMAL HIGH (ref 0.0–2.0)
Bicarbonate: 32.7 mmol/L — ABNORMAL HIGH (ref 20.0–28.0)
Delivery systems: POSITIVE
FIO2: 30
O2 Saturation: 91.8 %
Patient temperature: 37
pCO2, Ven: 65 mmHg — ABNORMAL HIGH (ref 44.0–60.0)
pH, Ven: 7.31 (ref 7.250–7.430)
pO2, Ven: 69 mmHg — ABNORMAL HIGH (ref 32.0–45.0)

## 2021-05-22 LAB — BASIC METABOLIC PANEL
Anion gap: 11 (ref 5–15)
BUN: 44 mg/dL — ABNORMAL HIGH (ref 8–23)
CO2: 29 mmol/L (ref 22–32)
Calcium: 8.4 mg/dL — ABNORMAL LOW (ref 8.9–10.3)
Chloride: 98 mmol/L (ref 98–111)
Creatinine, Ser: 1.44 mg/dL — ABNORMAL HIGH (ref 0.44–1.00)
GFR, Estimated: 37 mL/min — ABNORMAL LOW (ref >60)
Glucose, Bld: 105 mg/dL — ABNORMAL HIGH (ref 70–99)
Potassium: 3.8 mmol/L (ref 3.5–5.1)
Sodium: 138 mmol/L (ref 135–145)

## 2021-05-22 LAB — PROCALCITONIN: Procalcitonin: 1.13 ng/mL

## 2021-05-22 MED ORDER — FUROSEMIDE 10 MG/ML IJ SOLN
40.0000 mg | Freq: Two times a day (BID) | INTRAMUSCULAR | Status: DC
Start: 2021-05-22 — End: 2021-05-24
  Administered 2021-05-22 – 2021-05-24 (×5): 40 mg via INTRAVENOUS
  Filled 2021-05-22 (×5): qty 4

## 2021-05-22 NOTE — Progress Notes (Signed)
MD notified o fpt's MEWS in red, MD has called family to discuss goals of care and code status, family will make decisions when they arrive, MD will be updated once family decides.

## 2021-05-22 NOTE — Progress Notes (Signed)
MD at bedside has updated family on pt's status and discussed code status, family would like to transition to comfort care tomorrow while we await for more family to visit pt continues on BIPAP, invasive procedures to be d/c. No s/s of pain noted.

## 2021-05-22 NOTE — Progress Notes (Signed)
MD notified of pt's vs and that pt is off BIPAP, mews score of 4 also reported, BP rechecked and its wnl as charted. Pt letahrgic and unable to take PO meds.

## 2021-05-22 NOTE — Progress Notes (Signed)
PT Cancellation Note  Patient Details Name: Angela Sullivan MRN: [id number] DOB: 05/28/1940   Cancelled Treatment:    Reason Eval/Treat Not Completed: Medical issues which prohibited therapy;Fatigue/lethargy limiting ability to participate (Pt lethargic this AM, unable to take PO meds. Appears pt has been progressed to [facility] from continuous bipap. Will defer PT evaluation to later date/time as pt is able to participate/tolerate.)  9:06 AM, 05/22/21 Rosamaria Lints, PT, DPT Physical Therapist - [facility]  [phone] (ASCOM)    [name] 05/22/2021, 9:06 AM

## 2021-05-22 NOTE — Consult Note (Signed)
Cardiology Consult    Patient ID: Angela Sullivan MRN: [id number], DOB/AGE: [birth year]   Admit date: 05/21/2021 Date of Consult: 05/22/2021  Primary Physician: Kandyce Rud, MD Primary Cardiologist: Yvonne Kendall, MD Requesting Provider: C. Dareen Piano, MD  Patient Profile    Angela Sullivan is a 80 y.o. female with a history of anxiety, hypertension, hyperlipidemia, and gout, who was admitted December 18, after being found down at home, apparently after a fall with question of syncope.  She was found to be in A. fib, hypothermic (93.6 F), hyponatremic, hypokalemic, acidotic, in acute renal failure, and encephalopathic, and subsequently developed bright red blood per rectum with finding of duodenal ulcer status post clipping, who is being seen today for the evaluation of severe aortic stenosis at the request of Dr. Dareen Piano.  Past Medical History   Past Medical History:  Diagnosis Date   Anxiety    GIB (gastrointestinal bleeding)    To 1a. 04/2021 EGD: non-obstructing, nonbleeding ulcer with nonbleeding visible vessel suspicious for NSAID induced etiology.  Status post clipping.  Nonbleeding duodenal ulcers with clean ulcer base.  Normal stomach.  Normal esophagus.   Gout    High cholesterol    Hypertension    Severe aortic stenosis    a. 04/2021 Echo: EF 55-60%, no rwma, mod LVH. Septal HK. Low-nl RV fxn. Mildly elev PASP. Mild BAE. Mod MR. Mod-Sev TR. Sev AS (AVA 0.45cm^2 VTI; mean grad ).    Past Surgical History:  Procedure Laterality Date   DILATION AND CURETTAGE OF UTERUS     ESOPHAGOGASTRODUODENOSCOPY (EGD) WITH PROPOFOL N/A 05-27-21   Procedure: ESOPHAGOGASTRODUODENOSCOPY (EGD) WITH PROPOFOL;  Surgeon: Toney Reil, MD;  Location: [facility] ENDOSCOPY;  Service: Gastroenterology;  Laterality: N/A;     Allergies  No Known Allergies  History of Present Illness    80 year old female with the above past medical history including anxiety, hypertension,  hyperlipidemia, and gout.  Patient is currently unresponsive and does not follow commands.  She is unable to provide any pertinent history and therefore, history has been obtained from her chart.  Per notes, patient had not been feeling well for about 2 to 3 weeks.  On December 17, she apparently fell and required EMS to help her get up.  She initially refused transport to the hospital.  On December 18, she had a second fall and was apparently down on the floor for 8 or more hours.  EMS found the patient hypothermic with a temperature of 93.6 F.  She was taken to the emergency room where she had multiple lab abnormalities including a sodium of 114, potassium of 3.3, chloride of 82, CO2 of 20, AST 117, CK of 1181, troponin of 42, lactate of 3.6, H&H of 8.2 and 22.2, and mildly elevated INR at 1.4.  Chest x-ray, and CTs of the head, face, and spine showed no acute abnormalities.  She was admitted and rewarmed.  She was seen by nephrology and placed on 3% normal saline.  She was noted to be alert but confused.  She was placed on broad-spectrum antibiotics.  Early on December 20, she was noted to have black/maroon and tarry stools in the setting of chronic NSAID usage.  Hemoglobin dropped from 8.2 on admission to 6.2.  She was transfused (3 units) and seen by GI.  Nuclear medicine scan showed active bleeding from the descending duodenum with blood products passing into the jejunum.  EGD was performed and showed nonobstructing, nonbleeding duodenal ulcer with visible nonbleeding vessel, which was clipped.  Patient was seen both by general surgery and vascular surgery, and in light of clipping with stable H&H, did not require embolization.  During ICU stay, renal fxn and electrolyte abnormalities improved and she was transferred to the hospitalist service on December 22.  She has been awaiting SNF placement since.  On December 26, she reportedly had elevated heart rates in the setting of persistent atrial fibrillation  the rates have since been well controlled.  She has been receiving intravenous Lasix in the setting of anasarca.  On December 29, she again developed respiratory failure requiring BiPAP and transferred to progressive floor.  ECG was performed and showed atrial fibrillation at 99 bpm with a rightward axis and nonspecific T changes, similar to admission ECG.  Echocardiogram was also ordered and this showed an EF of 55 to 60% with moderate LVH, septal hypokinesis, low normal RV function, mildly elevated PASP, mild biatrial enlargement, moderate mitral regurgitation, moderate to severe tricuspid regurgitation, and severe aortic stenosis with a valve area of 0.45 cm with a mean gradient of 41 mmHg.  In light of abnormal echocardiographic findings/valvular disease, we have been asked to evaluate the patient this morning.  As noted above, patient is currently unresponsive.  She is lying in bed with her eyes open on high flow nasal cannula.  BUN and creatinine are higher this morning of 44 and 1.44.  She has been hemodynamically stable.  Per nurse, order just placed to resume BiPAP.  Inpatient Medications     chlorhexidine  15 mL Mouth Rinse BID   Chlorhexidine Gluconate Cloth  6 each Topical Q0600   feeding supplement  237 mL Oral TID BM   fluticasone  2 spray Each Nare Daily   folic acid  1 mg Oral Daily   furosemide  40 mg Intravenous BID   mouth rinse  15 mL Mouth Rinse q12n4p   multivitamin with minerals  1 tablet Oral Daily   pantoprazole (PROTONIX) IV  40 mg Intravenous Q12H   polyethylene glycol  17 g Oral Daily    Family History-obtained from historical documents as patient is unable to provide family history    Family History  Problem Relation Age of Onset   Heart disease Father    COPD Neg Hx    Diabetes Mellitus II Neg Hx    She indicated that her mother is deceased. She indicated that her father is deceased. She indicated that the status of her neg hx is unknown.   Social  History-obtained from historical documents as patient is unable to provide social history    Social History   Socioeconomic History   Marital status: Single    Spouse name: Not on file   Number of children: Not on file   Years of education: Not on file   Highest education level: Not on file  Occupational History    Employer: RETIRED  Tobacco Use   Smoking status: Never   Smokeless tobacco: Never  Vaping Use   Vaping Use: Never used  Substance and Sexual Activity   Alcohol use: Yes    Comment: occasional   Drug use: No   Sexual activity: Not on file  Other Topics Concern   Not on file  Social History Narrative   Not on file   Social Determinants of Health   Financial Resource Strain: Not on file  Food Insecurity: Not on file  Transportation Needs: Not on file  Physical Activity: Not on file  Stress: Not on file  Social Connections:  Not on file  Intimate Partner Violence: Not on file     Review of Systems    Patient is unable to provide review of systems at this time as she is unresponsive and does not follow commands.  Physical Exam    Blood pressure 114/64, pulse (!) 114, temperature 99.3 F (37.4 C), temperature source Axillary, resp. rate 20, height  (1.575 m), weight 93.2 kg, SpO2 93 %.  General: Laying in bed with her eyes open.  She seems to respond to voice but does not follow commands.  Use of accessory muscles for breathing. Psych: Does not answer questions or follow commands. Neuro: Does not move extremities spontaneously. HEENT: Normal  Neck: Supple without bruits or JVD. Lungs:  Resp appear labored on high flow nasal cannula with mouth breathing.  Scattered rhonchi throughout. Heart: Irregularly irregular, distant heart sounds.  Unable to clearly say that here a systolic murmur due to respiratory sounds and current use of high flow nasal cannula. Abdomen: Soft, non-tender, non-distended, BS + x 4.  Extremities: No clubbing, cyanosis.  Trace  bilateral lower extremity edema. DP/PT2+, Radials 2+ and equal bilaterally.  Labs    Cardiac Enzymes Recent Labs  Lab 05/06/2021 0928  TROPONINIHS 42*      Lab Results  Component Value Date   WBC 27.6 (H) 05/22/2021   HGB 8.9 (L) 05/22/2021   HCT 28.7 (L) 05/22/2021   MCV 114.8 (H) 05/22/2021   PLT 172 05/22/2021    Recent Labs  Lab 05/19/21 0428 05/20/21 2038 05/22/21 0642  NA 138   < > 138  K 3.5   < > 3.8  CL 104   < > 98  CO2 25   < > 29  BUN 25*   < > 44*  CREATININE 0.92   < > 1.44*  CALCIUM 8.7*   < > 8.4*  PROT 5.5*  --   --   BILITOT 1.9*  --   --   ALKPHOS 109  --   --   ALT 23  --   --   AST 24  --   --   GLUCOSE 98   < > 105*   < > = values in this interval not displayed.    Lab Results  Component Value Date   DDIMER 2.07 (H) 05/20/2021     Radiology Studies    DG Chest 1 View  Result Date: 2021-05-16 CLINICAL DATA:  80 year old female with shortness of breath and abdominal distension. EXAM: CHEST  1 VIEW COMPARISON:  Portable chest 04/29/2021 and earlier. FINDINGS: Portable AP semi upright view at 0459 hours. More rotated to the right. Stable cardiomegaly and mediastinal contours. Stable lung volumes. Allowing for portable technique the lungs are clear. No pneumothorax or pleural effusion identified. No acute osseous abnormality identified. Negative visible bowel gas. IMPRESSION: Cardiomegaly.  No acute cardiopulmonary abnormality. Electronically Signed   By: Odessa Fleming M.D.   On: May 16, 2021 05:34   DG Forearm Right  Result Date: 05/13/2021 CLINICAL DATA:  Patient coming [facility] from home for unwitnessed fall. Patient reportedly fell once yesterday as well; EMS helped patient up, but patient refused transport to hospital for first fall. Patient reports down for second fall for 8 hours. Pain and bruising down right arm and hip. Pt unable to verbalize coherently at this point, unable to obtain history. EXAM: RIGHT FOREARM - 2 VIEW COMPARISON:  None.  FINDINGS: No fracture or bone lesion. Elbow and wrist joints are normally aligned. There  is diffuse subcutaneous soft tissue edema, nonspecific. IMPRESSION: No fracture or dislocation. Electronically Signed   By: Amie Portland M.D.   On: Jun 08, 2021 10:52   DG Abd 1 View  Result Date: 05/16/2021 CLINICAL DATA:  80 year old female with shortness of breath and abdominal distension. EXAM: ABDOMEN - 1 VIEW COMPARISON:  CT Abdomen and Pelvis 01/28/2006. FINDINGS: Portable AP supine view at 0502 hours. Moderately distended gas-filled stomach. Gas containing but nondilated large bowel in the abdomen and visible pelvis. A few nondilated gas-filled small bowel loops are visible in the right lower abdomen. No definite pneumoperitoneum on this supine view. Visible lung bases appear negative. No acute osseous abnormality identified. IMPRESSION: 1. Moderately gas distended stomach. Consider NG tube decompression. 2. But elsewhere the bowel-gas pattern is within normal limits. Electronically Signed   By: Odessa Fleming M.D.   On: 05/08/2021 05:35   CT HEAD WO CONTRAST ( )  Result Date: 06/08/2021 CLINICAL DATA:  Fall.  Periorbital swelling.  Neck pain. EXAM: CT HEAD WITHOUT CONTRAST CT MAXILLOFACIAL WITHOUT CONTRAST CT CERVICAL SPINE WITHOUT CONTRAST TECHNIQUE: Multidetector CT imaging of the head, cervical spine, and maxillofacial structures were performed using the standard protocol without intravenous contrast. Multiplanar CT image reconstructions of the cervical spine and maxillofacial structures were also generated. COMPARISON:  None. FINDINGS: CT HEAD FINDINGS Brain: No evidence of acute infarction, hemorrhage, hydrocephalus, extra-axial collection or mass lesion/mass effect. Patchy white matter hypoattenuation noted bilaterally consistent with mild chronic microvascular ischemic change. Vascular: No hyperdense vessel or unexpected calcification. Skull: Normal. Negative for fracture or focal lesion. Other: None. CT  MAXILLOFACIAL FINDINGS Osseous: No fracture or mandibular dislocation. No destructive process. Orbits: Right preseptal periorbital soft tissue swelling/hemorrhage. No injury to the right globe or postseptal orbit. Normal left globe and orbit. Sinuses: Minimal dependent fluid in the left maxillary sinus. Remaining sinuses are clear. Clear mastoid air cells and middle ear cavities. Soft tissues: Soft tissue swelling extends from the right periorbital region across the right cheek. CT CERVICAL SPINE FINDINGS Alignment: Straightened cervical lordosis.  No spondylolisthesis. Skull base and vertebrae: No acute fracture. No primary bone lesion or focal pathologic process. Soft tissues and spinal canal: No prevertebral fluid or swelling. No visible canal hematoma. Disc levels: Mild loss of disc height at C4-C5. Moderate loss of disc height at C5-C6 and C6-C7. Mild spondylotic disc bulging with endplate spurring at these levels. No convincing disc herniation. Bilateral facet degenerative changes, greater on the left. Upper chest: No acute findings. Partly calcified apical pleuroparenchymal scarring. Other: None. IMPRESSION: HEAD CT 1. No acute intracranial abnormalities.  No skull fracture. MAXILLOFACIAL CT 1. No fractures. 2. Right preseptal periorbital soft tissue swelling/hemorrhage extending across the right cheek. No injury to the right globe or postseptal orbit. CERVICAL CT 1. No fracture or acute finding. Electronically Signed   By: Amie Portland M.D.   On: Jun 08, 2021 10:19   CT Angio Chest Pulmonary Embolism (PE) W or WO Contrast  Result Date: 05/20/2021 CLINICAL DATA:  Concern for pulmonary embolism. EXAM: CT ANGIOGRAPHY CHEST WITH CONTRAST TECHNIQUE: Multidetector CT imaging of the chest was performed using the standard protocol during bolus administration of intravenous contrast. Multiplanar CT image reconstructions and MIPs were obtained to evaluate the vascular anatomy. CONTRAST:  34mL OMNIPAQUE IOHEXOL 350  MG/ML SOLN COMPARISON:  Chest radiograph dated 05/20/2021. FINDINGS: Cardiovascular: Mild cardiomegaly. No pericardial effusion. Three-vessel coronary vascular calcification. There is retrograde flow of contrast from the right atrium into the IVC suggestive of right heart dysfunction. Correlation with echocardiogram  recommended. There is advanced atherosclerotic calcification of the thoracic aorta. No aneurysmal dilatation. Evaluation of the pulmonary arteries is limited due to respiratory motion artifact and suboptimal visualization of the peripheral branches. No large central pulmonary artery embolus identified. Mediastinum/Nodes: No hilar or mediastinal adenopathy. The esophagus is grossly unremarkable. No mediastinal fluid collection. Lungs/Pleura: Moderate right and trace left pleural effusions. There is partial consolidation of the right lower lobe which may represent compressive atelectasis or pneumonia. Underlying mass is not excluded clinical correlation and follow-up to resolution recommended. Biapical subpleural calcified plaques. No pneumothorax. The central airways are patent. Upper Abdomen: Small ascites. Musculoskeletal: Diffuse subcutaneous edema. Osteopenia with degenerative changes of the spine. No acute osseous pathology. Review of the MIP images confirms the above findings. IMPRESSION: 1. No CT evidence of central pulmonary artery embolus. 2. Moderate right and trace left pleural effusions with partial consolidation of the right lower lobe which may represent compressive atelectasis or pneumonia. Underlying mass is not excluded clinical correlation and follow-up to resolution recommended. 3. Mild cardiomegaly with evidence of right heart dysfunction. Correlation with echocardiogram recommended. 4. Small ascites and diffuse subcutaneous edema. 5. Aortic Atherosclerosis (ICD10-I70.0). Electronically Signed   By: Elgie Collard M.D.   On: 05/20/2021 23:06   CT Cervical Spine Wo  Contrast  Result Date: 05/09/2021 CLINICAL DATA:  Fall.  Periorbital swelling.  Neck pain. EXAM: CT HEAD WITHOUT CONTRAST CT MAXILLOFACIAL WITHOUT CONTRAST CT CERVICAL SPINE WITHOUT CONTRAST TECHNIQUE: Multidetector CT imaging of the head, cervical spine, and maxillofacial structures were performed using the standard protocol without intravenous contrast. Multiplanar CT image reconstructions of the cervical spine and maxillofacial structures were also generated. COMPARISON:  None. FINDINGS: CT HEAD FINDINGS Brain: No evidence of acute infarction, hemorrhage, hydrocephalus, extra-axial collection or mass lesion/mass effect. Patchy white matter hypoattenuation noted bilaterally consistent with mild chronic microvascular ischemic change. Vascular: No hyperdense vessel or unexpected calcification. Skull: Normal. Negative for fracture or focal lesion. Other: None. CT MAXILLOFACIAL FINDINGS Osseous: No fracture or mandibular dislocation. No destructive process. Orbits: Right preseptal periorbital soft tissue swelling/hemorrhage. No injury to the right globe or postseptal orbit. Normal left globe and orbit. Sinuses: Minimal dependent fluid in the left maxillary sinus. Remaining sinuses are clear. Clear mastoid air cells and middle ear cavities. Soft tissues: Soft tissue swelling extends from the right periorbital region across the right cheek. CT CERVICAL SPINE FINDINGS Alignment: Straightened cervical lordosis.  No spondylolisthesis. Skull base and vertebrae: No acute fracture. No primary bone lesion or focal pathologic process. Soft tissues and spinal canal: No prevertebral fluid or swelling. No visible canal hematoma. Disc levels: Mild loss of disc height at C4-C5. Moderate loss of disc height at C5-C6 and C6-C7. Mild spondylotic disc bulging with endplate spurring at these levels. No convincing disc herniation. Bilateral facet degenerative changes, greater on the left. Upper chest: No acute findings. Partly  calcified apical pleuroparenchymal scarring. Other: None. IMPRESSION: HEAD CT 1. No acute intracranial abnormalities.  No skull fracture. MAXILLOFACIAL CT 1. No fractures. 2. Right preseptal periorbital soft tissue swelling/hemorrhage extending across the right cheek. No injury to the right globe or postseptal orbit. CERVICAL CT 1. No fracture or acute finding. Electronically Signed   By: Amie Portland M.D.   On: 05/01/2021 10:19   NM GI Blood Loss  Result Date: 2021/06/11 CLINICAL DATA:  Bright red blood per rectum also mixed with melena by report obtained from nursing staff in nuclear medicine during the examination. EXAM: NUCLEAR MEDICINE GASTROINTESTINAL BLEEDING SCAN TECHNIQUE: Sequential abdominal images  were obtained following intravenous administration of Tc-70m labeled red blood cells. RADIOPHARMACEUTICALS:  20.92 mCi Tc-21m pertechnetate in-vitro labeled red cells. COMPARISON:  No recent comparison imaging is available. There is imaging from 2007. FINDINGS: Initial images show some activity in the LEFT lower quadrant. Subtle activity noted over the descending duodenum/midline early in the examination as well. Later in the examination more pronounced activity is demonstrated over the expected location of the descending duodenum and serpiginous appearance of numerous areas of activity over the LEFT hemiabdomen most suggestive of labelled blood products filling jejunal loops beyond suspected site of bleeding in the proximal and or descending duodenum. IMPRESSION: Signs of active bleeding favored to originate from the descending duodenum with blood products passing into the jejunum on more delayed images and intermittently throughout the scan. Critical Value/emergent results were called by telephone at the time of interpretation on 05/15/2021 at 12:30 pm to provider Harlon Ditty, NP , who verbally acknowledged these results. Electronically Signed   By: Donzetta Kohut M.D.   On: 05/03/2021 12:31   DG  Hand 2 View Right  Result Date: 05/16/2021 CLINICAL DATA:  Patient coming [facility] from home for unwitnessed fall. Patient reportedly fell once yesterday as well; EMS helped patient up, but patient refused transport to hospital for first fall. Patient reports down for second fall for 8 hours. Pain and bruising down right arm and hip. Pt unable to verbalize coherently at this point, unable to obtain history. EXAM: RIGHT HAND - 2 VIEW COMPARISON:  None. FINDINGS: No fracture or bone lesion. Arthropathic changes involving multiple joints, most prominently the DIP joint of the middle finger, pattern consistent with osteoarthritis. Joints are normally aligned. Soft tissues are unremarkable. IMPRESSION: 1. No fracture or dislocation. 2. Osteoarthritis. Electronically Signed   By: Amie Portland M.D.   On: 05/20/2021 10:53   DG Chest Port 1 View  Result Date: 05/20/2021 CLINICAL DATA:  Shortness of breath EXAM: PORTABLE CHEST 1 VIEW COMPARISON:  07/13/2020 FINDINGS: Moderate enlargement of the cardiopericardial silhouette noted with atherosclerotic calcification of the aortic arch. Hazy density in the right mid lung potentially from fluid or atelectasis within or along the minor fissure. Mild right retrocardiac airspace opacity along with accentuated density along the right diaphragm. The left lung appears clear. Biapical calcifications, likely chronic postinflammatory. 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 IMPRESSION: 1. Indistinct band of airspace opacity in the right midlung with right retrocardiac and right retro diaphragmatic airspace opacity. This could be from pleural effusion with associated atelectasis; pneumonia is a differential diagnostic consideration. 2. Moderate enlargement of the cardiopericardial silhouette, without edema. 3.  Aortic Atherosclerosis (ICD10-I70.0). 4. Biapical pleuroparenchymal scarring with calcifications. 5. Asymmetric left degenerative glenohumeral arthropathy  Electronically Signed   By: Gaylyn Rong M.D.   On: 05/20/2021 18:01   DG Chest Port 1 View  Result Date: 05/11/2021 CLINICAL DATA:  Patient coming [facility] from home for unwitnessed fall. Patient reportedly fell once yesterday as well; EMS helped patient up, but patient refused transport to hospital for first fall. Patient reports down for second fall for 8 hours. Pain and bruising down right arm and hip. Pt unable to verbalize coherently at this point, unable to obtain history. EXAM: PORTABLE CHEST 1 VIEW COMPARISON:  03/21/2017 FINDINGS: Mild enlargement of the cardiopericardial silhouette. No mediastinal or hilar masses. Clear lungs.  No convincing pleural effusion or pneumothorax. Skeletal structures are grossly intact. IMPRESSION: No active disease. Electronically Signed   By: Amie Portland M.D.   On: 05/22/2021 10:50  DG Humerus Right  Result Date: Jun 02, 2021 CLINICAL DATA:  Patient coming [facility] from home for unwitnessed fall. Patient reportedly fell once yesterday as well; EMS helped patient up, but patient refused transport to hospital for first fall. Patient reports down for second fall for 8 hours. Pain and bruising down right arm and hip. Pt unable to verbalize coherently at this point, unable to obtain history. EXAM: RIGHT HUMERUS - 2+ VIEW COMPARISON:  None. FINDINGS: No fracture or bone lesion. Glenohumeral and elbow joints are normally aligned. Soft tissues are unremarkable. IMPRESSION: No fracture or dislocation. Electronically Signed   By: Amie Portland M.D.   On: 2021-06-02 10:51   DG HIP UNILAT WITH PELVIS 2-3 VIEWS RIGHT  Result Date: 06/02/21 CLINICAL DATA:  Patient coming [facility] from home for unwitnessed fall. Patient reportedly fell once yesterday as well; EMS helped patient up, but patient refused transport to hospital for first fall. Patient reports down for second fall for 8 hours. Pain and bruising down right arm and hip. Pt unable to verbalize coherently at this  point, unable to obtain history. EXAM: DG HIP (WITH OR WITHOUT PELVIS) 2-3V RIGHT COMPARISON:  None. FINDINGS: No fracture or bone lesion. Hip joints, SI joints and symphysis pubis are normally aligned. Soft tissues are unremarkable. IMPRESSION: 1. No fracture or dislocation. Electronically Signed   By: Amie Portland M.D.   On: June 02, 2021 10:50   CT Maxillofacial Wo Contrast  Result Date: 06/02/21 CLINICAL DATA:  Fall.  Periorbital swelling.  Neck pain. EXAM: CT HEAD WITHOUT CONTRAST CT MAXILLOFACIAL WITHOUT CONTRAST CT CERVICAL SPINE WITHOUT CONTRAST TECHNIQUE: Multidetector CT imaging of the head, cervical spine, and maxillofacial structures were performed using the standard protocol without intravenous contrast. Multiplanar CT image reconstructions of the cervical spine and maxillofacial structures were also generated. COMPARISON:  None. FINDINGS: CT HEAD FINDINGS Brain: No evidence of acute infarction, hemorrhage, hydrocephalus, extra-axial collection or mass lesion/mass effect. Patchy white matter hypoattenuation noted bilaterally consistent with mild chronic microvascular ischemic change. Vascular: No hyperdense vessel or unexpected calcification. Skull: Normal. Negative for fracture or focal lesion. Other: None. CT MAXILLOFACIAL FINDINGS Osseous: No fracture or mandibular dislocation. No destructive process. Orbits: Right preseptal periorbital soft tissue swelling/hemorrhage. No injury to the right globe or postseptal orbit. Normal left globe and orbit. Sinuses: Minimal dependent fluid in the left maxillary sinus. Remaining sinuses are clear. Clear mastoid air cells and middle ear cavities. Soft tissues: Soft tissue swelling extends from the right periorbital region across the right cheek. CT CERVICAL SPINE FINDINGS Alignment: Straightened cervical lordosis.  No spondylolisthesis. Skull base and vertebrae: No acute fracture. No primary bone lesion or focal pathologic process. Soft tissues and spinal  canal: No prevertebral fluid or swelling. No visible canal hematoma. Disc levels: Mild loss of disc height at C4-C5. Moderate loss of disc height at C5-C6 and C6-C7. Mild spondylotic disc bulging with endplate spurring at these levels. No convincing disc herniation. Bilateral facet degenerative changes, greater on the left. Upper chest: No acute findings. Partly calcified apical pleuroparenchymal scarring. Other: None. IMPRESSION: HEAD CT 1. No acute intracranial abnormalities.  No skull fracture. MAXILLOFACIAL CT 1. No fractures. 2. Right preseptal periorbital soft tissue swelling/hemorrhage extending across the right cheek. No injury to the right globe or postseptal orbit. CERVICAL CT 1. No fracture or acute finding. Electronically Signed   By: Amie Portland M.D.   On: June 02, 2021 10:19    ECG & Cardiac Imaging    December 29: Atrial fibrillation, 99, rightward axis, nonspecific T  changes- personally reviewed.  Assessment & Plan    1.  Severe aortic stenosis: Patient with prolonged admission in the setting of hypothermia, GI bleed/anemia, and metabolic encephalopathy, who had recurrent respiratory failure yesterday requiring placement of BiPAP.  EKG showed no acute ST or T changes.  Echocardiogram showed normal LV function with severe aortic stenosis, moderate mitral regurgitation, and moderate to severe tricuspid regurgitation.  Currently, patient is unresponsive and does not follow commands.  She is having increased work of breathing and use of accessory muscles despite high flow nasal cannula.  Per nursing, patient to be placed on BiPAP and based on subsequent notes, patient to be made comfort care by family.  Patient is not currently a candidate for further evaluation of severe aortic stenosis.  2.  Acute respiratory failure: Patient with increased work of breathing over the past 24 hours despite high flow nasal cannula.  To be transitioned to BiPAP.  As above, notes indicate patient to be moved to  comfort care based on family's wishes.  Signed, Nicolasa Ducking, NP 05/22/2021, 2:03 PM  For questions or updates, please contact   Please consult [email or web address] for contact info under Cardiology/STEMI.

## 2021-05-22 NOTE — Progress Notes (Signed)
Pharmacy Antibiotic Note  Angela Sullivan is a 80 y.o. female admitted on 05/13/2021 with pneumonia.  Pharmacy has been consulted to dose Cefepime for HCAP and Vancomycin for sepsis.    Vancomycin and ceftriaxone discontinued. Cefepime continued for HCAP? Noted Scr worsened as well  Plan: Cefepime 2 gm q12h per indication and renal function, remains appropriate for CrCl 32.71ml/min  Pharmacy will continue to follow and adjust abx dosing if warranted.   Height: 5\' 2"  (157.5 cm) Weight: 93.2 kg (205 lb 7.5 oz) IBW/kg (Calculated) : 50.1  Temp (24hrs), Avg:98.4 F (36.9 C), Min:98 F (36.7 C), Max:99.3 F (37.4 C)  Recent Labs  Lab 05/17/21 0452 05/19/21 0428 05/20/21 2038 05/21/21 0650 05/22/21 0642  WBC 10.5 9.6 17.0* 19.9* 27.6*  CREATININE 0.84 0.92 1.03* 1.05* 1.44*  LATICACIDVEN  --   --  1.3  --   --      Estimated Creatinine Clearance: 32.6 mL/min (A) (by C-G formula based on SCr of 1.44 mg/dL (H)).    No Known Allergies  Antimicrobials this admission: 12/28 Unasyn >> 12/29 12/29 Ceftriaxone >>  12/29 Cefepime x 1 12/29 Vancomycin x 1  Microbiology results: 12/29: MRSA PCR  - not detected  Thank you for allowing pharmacy to be a part of this patients care. Jaquis Picklesimer Rodriguez-Guzman PharmD, BCPS 05/22/2021 2:39 PM

## 2021-05-22 NOTE — Progress Notes (Signed)
OT Cancellation Note  Patient Details Name: Angela Sullivan MRN: [id number] DOB: Oct 13, 1940   Cancelled Treatment:    Reason Eval/Treat Not Completed: Medical issues which prohibited therapy. Pt with change in functional status and now requiring continuous BiPap. OT to SIGN OFF. Please re-consult when pt is able to participate in therapeutic intervention.   Jackquline Denmark, MS, OTR/L , CBIS ascom [phone]  05/22/21, 9:22 AM

## 2021-05-22 NOTE — Progress Notes (Signed)
PROGRESS NOTE  Angela Sullivan    DOB: 1940/11/02, 80 y.o.  ZOX:[id number]  PCP: Kandyce Rud, MD   Code Status: DNR   DOA: 04/25/2021   LOS: 12  Brief Narrative of Current Hospitalization  Angela Sullivan is a 80 y.o. female with a PMH significant for HTN, HLD. They presented from home to the ED on 05/22/2021 with syncope. In the ED, it was found that they had hypothermia, hyponatremia, acute renal failure and metabolic encephalopathy.  Additionally, she was found to have bloody stools from duodenal ulcers.  She was admitted to the ICU. They were treated with EGD on 12/20 with epinephrine injection and clipping.  She also received 3 units PRBCs.  Patient was admitted to medicine service for further workup and management of GI bleed, hyponatremia, acute metabolic encephalopathy as outlined in detail below.  05/22/21 -stable  Assessment & Plan  Principal Problem:   Upper GI bleed Active Problems:   Hyponatremia   High cholesterol   Hypertension   Fall at home, initial encounter   New onset atrial fibrillation (HCC)   Elevated troponin   Abnormal LFTs   Rhabdomyolysis   Hypokalemia   SIRS (systemic inflammatory response syndrome) (HCC)   Hypothermia   Pressure injury of skin   Acute metabolic encephalopathy   AKI (acute kidney injury) (HCC)   Anasarca   Acute respiratory failure with hypoxia (HCC)  New onset acute respiratory failure with hypoxia- CTA negative for PE. Chest imaging non-specific but suggestive of atelectasis vs pneumonia vs congestion. Given hypervolemic exam and rhonchi, will continue diuretics as well as antibiotics. Echo showed significant valvular disease. Respiratory has attempted to wean patient x3 from bipap now and patient cannot tolerate. She remains DNR. - cardiology consulted, appreciate recs - continue Bipap PRN and wean oxygen as tolerated - continue IV antibiotics - IV diuresis - strict I/O  Upper GI bleed- resolved. s/p EGD and duodenal ulcer  injection and clipping on 12/20.  S/p 3 units PRBCs. Hgb generally stable 9.2>10.3>9.4>8.9 -GI has signed off -Avoid NSAIDs -Continue Protonix -PT/OT for deconditioning, recommending SNF - CBC am  AKI   hypokalemia   hyponatremia   rhabdomyolysis-repeat AKI. Cr 1.44 today from hypoperfusion. - nephrology has signed off -Avoid nephrotoxic agents, potentially improvement with diuretics - strict I/O  Acute metabolic encephalopathy   hypothermia -resolved. Patient now somnolent from expected hypoxia  New onset Afib- in setting of acute illness. Anticoagulation being held with acute GI bleed. Having increased tachycardia today - continue metoprolol - f/u cardiology, afib clinic - repeat ECG  HTN- chronic, well controlled - holding home medications since hypotensive on admission  GOC-  - palliative care following, appreciate recs   DVT prophylaxis: SCDs Start: 05/05/2021 1144   Diet:  Diet Orders (From admission, onward)     Start     Ordered   05/14/21 1226  DIET SOFT Room service appropriate? Yes; Fluid consistency: Thin  Diet effective now       Question Answer Comment  Room service appropriate? Yes   Fluid consistency: Thin      05/14/21 1225            Subjective 05/22/21    Pt opens eyes and follows simple commands. Denies current needs. She is more somnolent than ever before this morning. Unable to communicate with bipap in place and cannot be removed.   Disposition Plan & Communication  Patient status: Inpatient  Admitted From: Home Disposition: Skilled nursing facility Anticipated discharge date: TBD  Family  Communication: friend Consults, Procedures, Significant Events  Consultants:  GI CCM Nephrology Cardiology   Procedures/significant events:  EGD 12/20  Antimicrobials:  Anti-infectives (From admission, onward)    Start     Dose/Rate Route Frequency Ordered Stop   05/22/21 0415  vancomycin (VANCOREADY) IVPB 750 mg/150 mL  Status:  Discontinued        See Hyperspace for full Linked Orders Report.   750 mg 150 mL/hr over 60 Minutes Intravenous Every 24 hours 05/21/21 0315 05/21/21 1417   05/21/21 0800  ceFEPIme (MAXIPIME) 2 g in sodium chloride 0.9 % 100 mL IVPB        2 g 200 mL/hr over 30 Minutes Intravenous Every 12 hours 05/21/21 0307     05/21/21 0600  cefTRIAXone (ROCEPHIN) 1 g in sodium chloride 0.9 % 100 mL IVPB  Status:  Discontinued        1 g 200 mL/hr over 30 Minutes Intravenous Every 24 hours 05/21/21 0202 05/21/21 0759   05/21/21 0400  vancomycin (VANCOREADY) IVPB 2000 mg/400 mL       See Hyperspace for full Linked Orders Report.   2,000 mg 200 mL/hr over 120 Minutes Intravenous  Once 05/21/21 0315 05/21/21 0549   05/21/21 0230  doxycycline (VIBRAMYCIN) 100 mg in sodium chloride 0.9 % 250 mL IVPB  Status:  Discontinued        100 mg 125 mL/hr over 120 Minutes Intravenous Every 12 hours 05/21/21 0134 05/21/21 0205   05/20/21 2307  Ampicillin-Sulbactam (UNASYN) 3 g in sodium chloride 0.9 % 100 mL IVPB  Status:  Discontinued        3 g 200 mL/hr over 30 Minutes Intravenous Every 6 hours 05/20/21 2307 05/21/21 0202       Objective   Vitals:   05/22/21 0000 05/22/21 0200 05/22/21 0215 05/22/21 0400  BP: 98/65 103/65  103/75  Pulse:  (!) 102 (!) 108 (!) 104  Resp: 18 (!) 26 (!) 27 (!) 24  Temp: 98 F (36.7 C) 98.1 F (36.7 C)  98.2 F (36.8 C)  TempSrc: Axillary Axillary    SpO2:   97% 94%  Weight:      Height:        Intake/Output Summary (Last 24 hours) at 05/22/2021 0713 Last data filed at 05/22/2021 0655 Gross per 24 hour  Intake 246.26 ml  Output 1350 ml  Net -1103.74 ml    Filed Weights   05/14/21 0500 05/15/21 0300 05/16/21 0438  Weight: 93 kg 91.2 kg 93.2 kg    Patient BMI: Body mass index is 37.58 kg/m.   Physical Exam:  General: awake, alert, NAD Respiratory: normal respiratory effort. Bipap in place. Breath sounds to bases bilaterally with rhonchi. Cardiovascular: normal S1/S2,  tachycardic, no JVD, murmurs, quick capillary refill  Nervous: Alert and responsive Extremities: moves all equally, no LE edema, normal tone. Bilateral UE pitting edema Skin: dry, intact, normal temperature, normal color. No rashes, lesions or ulcers on exposed skin Psychiatry: flat, sedated  Labs   I have personally reviewed following labs and imaging studies CBC    Component Value Date/Time   WBC 27.6 (H) 05/22/2021 0642   RBC 2.50 (L) 05/22/2021 0642   HGB 8.9 (L) 05/22/2021 0642   HCT 28.7 (L) 05/22/2021 0642   PLT 172 05/22/2021 0642   MCV 114.8 (H) 05/22/2021 0642   MCH 35.6 (H) 05/22/2021 0642   MCHC 31.0 05/22/2021 0642   RDW 19.5 (H) 05/22/2021 0642   LYMPHSABS 0.6 (L) 05/20/2021 2038  MONOABS 1.2 (H) 05/20/2021 2038   EOSABS 0.0 05/20/2021 2038   BASOSABS 0.1 05/20/2021 2038   BMP Latest Ref Rng & Units 05/22/2021 05/21/2021 05/20/2021  Glucose 70 - 99 mg/dL 818(H) 631(S) 970(Y)  BUN 8 - 23 mg/dL 63(Z) 85(Y) 85(O)  Creatinine 0.44 - 1.00 mg/dL 2.77(A) 1.28(N) 8.67(E)  Sodium 135 - 145 mmol/L 138 137 134(L)  Potassium 3.5 - 5.1 mmol/L 3.8 4.0 3.8  Chloride 98 - 111 mmol/L 98 97(L) 97(L)  CO2 22 - 32 mmol/L 29 27 28   Calcium 8.9 - 10.3 mg/dL ) 7.2(C) 9.1   Imaging Studies  CT Angio Chest Pulmonary Embolism (PE) W or WO Contrast  Result Date: 05/20/2021 CLINICAL DATA:  Concern for pulmonary embolism. EXAM: CT ANGIOGRAPHY CHEST WITH CONTRAST TECHNIQUE: Multidetector CT imaging of the chest was performed using the standard protocol during bolus administration of intravenous contrast. Multiplanar CT image reconstructions and MIPs were obtained to evaluate the vascular anatomy. CONTRAST:  26mL OMNIPAQUE IOHEXOL 350 MG/ML SOLN COMPARISON:  Chest radiograph dated 05/20/2021. FINDINGS: Cardiovascular: Mild cardiomegaly. No pericardial effusion. Three-vessel coronary vascular calcification. There is retrograde flow of contrast from the right atrium into the IVC suggestive  of right heart dysfunction. Correlation with echocardiogram recommended. There is advanced atherosclerotic calcification of the thoracic aorta. No aneurysmal dilatation. Evaluation of the pulmonary arteries is limited due to respiratory motion artifact and suboptimal visualization of the peripheral branches. No large central pulmonary artery embolus identified. Mediastinum/Nodes: No hilar or mediastinal adenopathy. The esophagus is grossly unremarkable. No mediastinal fluid collection. Lungs/Pleura: Moderate right and trace left pleural effusions. There is partial consolidation of the right lower lobe which may represent compressive atelectasis or pneumonia. Underlying mass is not excluded clinical correlation and follow-up to resolution recommended. Biapical subpleural calcified plaques. No pneumothorax. The central airways are patent. Upper Abdomen: Small ascites. Musculoskeletal: Diffuse subcutaneous edema. Osteopenia with degenerative changes of the spine. No acute osseous pathology. Review of the MIP images confirms the above findings. IMPRESSION: 1. No CT evidence of central pulmonary artery embolus. 2. Moderate right and trace left pleural effusions with partial consolidation of the right lower lobe which may represent compressive atelectasis or pneumonia. Underlying mass is not excluded clinical correlation and follow-up to resolution recommended. 3. Mild cardiomegaly with evidence of right heart dysfunction. Correlation with echocardiogram recommended. 4. Small ascites and diffuse subcutaneous edema. 5. Aortic Atherosclerosis (ICD10-I70.0). Electronically Signed   By: 05/22/2021 M.D.   On: 05/20/2021 23:06   DG Chest Port 1 View  Result Date: 05/20/2021 CLINICAL DATA:  Shortness of breath EXAM: PORTABLE CHEST 1 VIEW COMPARISON:  07/13/2020 FINDINGS: Moderate enlargement of the cardiopericardial silhouette noted with atherosclerotic calcification of the aortic arch. Hazy density in the right mid  lung potentially from fluid or atelectasis within or along the minor fissure. Mild right retrocardiac airspace opacity along with accentuated density along the right diaphragm. The left lung appears clear. Biapical calcifications, likely chronic postinflammatory. 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 IMPRESSION: 1. Indistinct band of airspace opacity in the right midlung with right retrocardiac and right retro diaphragmatic airspace opacity. This could be from pleural effusion with associated atelectasis; pneumonia is a differential diagnostic consideration. 2. Moderate enlargement of the cardiopericardial silhouette, without edema. 3.  Aortic Atherosclerosis (ICD10-I70.0). 4. Biapical pleuroparenchymal scarring with calcifications. 5. Asymmetric left degenerative glenohumeral arthropathy Electronically Signed   By: 07/15/2020 M.D.   On: 05/20/2021 18:01   ECHOCARDIOGRAM COMPLETE  Result Date: 05/21/2021    ECHOCARDIOGRAM REPORT  Patient Name:   Angela Sullivan Date of Exam: 05/21/2021 Medical Rec #:  [id number]     Height:       62.0 in Accession #:    [id number]    Weight:       205.5 lb Date of Birth:  12/20/1940    BSA:          1.934 m Patient Age:    80 years      BP:           110/78 mmHg Patient Gender: F             HR:           100 bpm. Exam Location:  [facility] Procedure: 2D Echo, Cardiac Doppler and Color Doppler Indications:     CHF I50.9  History:         Patient has no prior history of Echocardiogram examinations.                  Risk Factors:Hypertension.  Sonographer:     Cristela Blue Referring Phys:  [id number] BRENDA MORRISON Diagnosing Phys: Yvonne Kendall MD  Sonographer Comments: Suboptimal apical window. Pt on Bipap and non-verbal---pushing against my scan hand. IMPRESSIONS  1. Left ventricular ejection fraction, by estimation, is 55 to 60%. The left ventricle has normal function. The left ventricle demonstrates regional wall motion abnormalities (see scoring  diagram/findings for description). There is moderate left ventricular hypertrophy. Left ventricular diastolic parameters are indeterminate. There is mild hypokinesis of the left ventricular, entire septal wall.  2. Right ventricular systolic function is low normal. The right ventricular size is normal. There is mildly elevated pulmonary artery systolic pressure.  3. Left atrial size was mildly dilated.  4. Right atrial size was mildly dilated.  5. The mitral valve is degenerative. Moderate mitral valve regurgitation. No evidence of mitral stenosis.  6. Tricuspid valve regurgitation is moderate to severe.  7. The aortic valve has an indeterminant number of cusps. There is severe calcifcation of the aortic valve. There is severe thickening of the aortic valve. Aortic valve regurgitation is mild. Severe aortic valve stenosis. Aortic valve area, by VTI measures 0.45 cm. Aortic valve mean gradient measures 41.0 mmHg.  8. The inferior vena cava is dilated in size with <50% respiratory variability, suggesting right atrial pressure of 15 mmHg.  9. Rhythm strip during this exam demostrated atrial fibrillation. Comparison(s): No prior Echocardiogram. FINDINGS  Left Ventricle: Left ventricular ejection fraction, by estimation, is 55 to 60%. The left ventricle has normal function. The left ventricle demonstrates regional wall motion abnormalities. Mild hypokinesis of the left ventricular, entire septal wall. The left ventricular internal cavity size was normal in size. There is moderate left ventricular hypertrophy. Left ventricular diastolic function could not be evaluated due to atrial fibrillation. Left ventricular diastolic parameters are indeterminate. Right Ventricle: The right ventricular size is normal. No increase in right ventricular wall thickness. Right ventricular systolic function is low normal. There is mildly elevated pulmonary artery systolic pressure. The tricuspid regurgitant velocity is 2.50 m/s, and with  an assumed right atrial pressure of 15 mmHg, the estimated right ventricular systolic pressure is 40.0 mmHg. Left Atrium: Left atrial size was mildly dilated. Right Atrium: Right atrial size was mildly dilated. Pericardium: Trivial pericardial effusion is present. Mitral Valve: The mitral valve is degenerative in appearance. There is moderate thickening of the mitral valve leaflet(s). There is mild calcification of the mitral valve leaflet(s). Mild to moderate mitral annular calcification. Moderate mitral  valve regurgitation. No evidence of mitral valve stenosis. MV peak gradient, 8.4 mmHg. The mean mitral valve gradient is 4.0 mmHg. Tricuspid Valve: The tricuspid valve is not well visualized. Tricuspid valve regurgitation is moderate to severe. Aortic Valve: The aortic valve has an indeterminant number of cusps. There is severe calcifcation of the aortic valve. There is severe thickening of the aortic valve. Aortic valve regurgitation is mild. Severe aortic stenosis is present. Aortic valve mean gradient measures 41.0 mmHg. Aortic valve peak gradient measures 70.4 mmHg. Aortic valve area, by VTI measures 0.45 cm. Pulmonic Valve: The pulmonic valve was not well visualized. Pulmonic valve regurgitation is not visualized. No evidence of pulmonic stenosis. Aorta: The aortic root is normal in size and structure. Pulmonary Artery: The pulmonary artery is not well seen. Venous: The inferior vena cava is dilated in size with less than 50% respiratory variability, suggesting right atrial pressure of 15 mmHg. IAS/Shunts: The interatrial septum was not well visualized. EKG: Rhythm strip during this exam demostrated atrial fibrillation.  LEFT VENTRICLE PLAX 2D LVIDd:         4.10 cm   Diastology LVIDs:         2.90 cm   LV e' medial:    4.24 cm/s LV PW:         1.30 cm   LV E/e' medial:  29.7 LV IVS:        1.05 cm   LV e' lateral:   6.42 cm/s LVOT diam:     2.00 cm   LV E/e' lateral: 19.6 LV SV:         32 LV SV Index:   17  LVOT Area:     3.14 cm  RIGHT VENTRICLE RV Basal diam:  4.10 cm RV S prime:     7.51 cm/s LEFT ATRIUM             Index        RIGHT ATRIUM           Index LA diam:        4.20 cm 2.17 cm/m   RA Area:     23.80 cm LA Vol (A2C):   78.1 ml 40.39 ml/m  RA Volume:   75.40 ml  38.99 ml/m LA Vol (A4C):   70.4 ml 36.41 ml/m LA Biplane Vol: 76.1 ml 39.35 ml/m  AORTIC VALVE                     PULMONIC VALVE AV Area (Vmax):    0.42 cm      PV Vmax:        0.76 m/s AV Area (Vmean):   0.40 cm      PV Vmean:       48.700 cm/s AV Area (VTI):     0.45 cm      PV VTI:         0.122 m AV Vmax:           419.67 cm/s   PV Peak grad:   2.3 mmHg AV Vmean:          293.000 cm/s  PV Mean grad:   1.0 mmHg AV VTI:            0.719 m       RVOT Peak grad: 4 mmHg AV Peak Grad:      70.4 mmHg AV Mean Grad:      41.0 mmHg LVOT Vmax:  55.50 cm/s LVOT Vmean:        37.600 cm/s LVOT VTI:          0.102 m LVOT/AV VTI ratio: 0.14  AORTA Ao Root diam: 2.93 cm MITRAL VALVE                TRICUSPID VALVE MV Area (PHT): 6.43 cm     TR Peak grad:   25.0 mmHg MV Area VTI:   1.05 cm     TR Vmax:        250.00 cm/s MV Peak grad:  8.4 mmHg MV Mean grad:  4.0 mmHg     SHUNTS MV Vmax:       1.45 m/s     Systemic VTI:  0.10 m MV Vmean:      95.8 cm/s    Systemic Diam: 2.00 cm MV Decel Time: 118 msec     Pulmonic VTI:  0.138 m MV E velocity: 126.00 cm/s MV A velocity: 93.00 cm/s MV E/A ratio:  1.35 Cristal [place] MD Electronically signed by Yvonne Kendall MD Signature Date/Time: 05/21/2021/4:12:07 PM    Final     Medications   Scheduled Meds:  chlorhexidine  15 mL Mouth Rinse BID   Chlorhexidine Gluconate Cloth  6 each Topical Q0600   feeding supplement  237 mL Oral TID BM   fluticasone  2 spray Each Nare Daily   folic acid  1 mg Oral Daily   furosemide  40 mg Oral Daily   mouth rinse  15 mL Mouth Rinse q12n4p   multivitamin with minerals  1 tablet Oral Daily   pantoprazole (PROTONIX) IV  40 mg Intravenous Q12H   polyethylene  glycol  17 g Oral Daily   No recently discontinued medications to reconcile  LOS: 12 days   Leeroy Bock, DO [facility] 05/22/2021, 7:13 AM   Available by Epic secure chat 7AM-7PM. If 7PM-7AM, please contact night-coverage Refer to [email or web address] to contact the [facility] Attending or Consulting provider for this pt

## 2021-05-23 DIAGNOSIS — R7989 Other specified abnormal findings of blood chemistry: Secondary | ICD-10-CM | POA: Diagnosis not present

## 2021-05-23 DIAGNOSIS — K922 Gastrointestinal hemorrhage, unspecified: Secondary | ICD-10-CM | POA: Diagnosis not present

## 2021-05-23 DIAGNOSIS — E871 Hypo-osmolality and hyponatremia: Secondary | ICD-10-CM | POA: Diagnosis not present

## 2021-05-23 DIAGNOSIS — I350 Nonrheumatic aortic (valve) stenosis: Secondary | ICD-10-CM

## 2021-05-23 DIAGNOSIS — G9341 Metabolic encephalopathy: Secondary | ICD-10-CM | POA: Diagnosis not present

## 2021-05-23 LAB — BLOOD GAS, ARTERIAL
Acid-Base Excess: 5.2 mmol/L — ABNORMAL HIGH (ref 0.0–2.0)
Bicarbonate: 32.9 mmol/L — ABNORMAL HIGH (ref 20.0–28.0)
Delivery systems: POSITIVE
Expiratory PAP: 8
FIO2: 0.3
Inspiratory PAP: 14
O2 Saturation: 96.5 %
Patient temperature: 37
pCO2 arterial: 61 mmHg — ABNORMAL HIGH (ref 32.0–48.0)
pH, Arterial: 7.34 — ABNORMAL LOW (ref 7.350–7.450)
pO2, Arterial: 91 mmHg (ref 83.0–108.0)

## 2021-05-23 MED ORDER — GLYCOPYRROLATE 0.2 MG/ML IJ SOLN
0.2000 mg | INTRAMUSCULAR | Status: DC | PRN
  Filled 2021-05-23: qty 1

## 2021-05-23 MED ORDER — ONDANSETRON 4 MG PO TBDP
4.0000 mg | ORAL_TABLET | Freq: Four times a day (QID) | ORAL | Status: DC | PRN
  Filled 2021-05-23: qty 1

## 2021-05-23 MED ORDER — BIOTENE DRY MOUTH MT LIQD: 15.0000 mL | OROMUCOSAL | Status: DC | PRN

## 2021-05-23 MED ORDER — ACETAMINOPHEN 650 MG RE SUPP: 650.0000 mg | Freq: Four times a day (QID) | RECTAL | Status: DC | PRN

## 2021-05-23 MED ORDER — ACETAMINOPHEN 325 MG PO TABS: 650.0000 mg | ORAL_TABLET | Freq: Four times a day (QID) | ORAL | Status: DC | PRN

## 2021-05-23 MED ORDER — MORPHINE SULFATE (PF) 2 MG/ML IV SOLN
1.0000 mg | INTRAVENOUS | Status: DC | PRN
  Administered 2021-05-24 (×2): 1 mg via INTRAVENOUS
  Filled 2021-05-23 (×2): qty 1

## 2021-05-23 MED ORDER — HALOPERIDOL LACTATE 5 MG/ML IJ SOLN: 0.5000 mg | INTRAMUSCULAR | Status: DC | PRN

## 2021-05-23 MED ORDER — HALOPERIDOL LACTATE 2 MG/ML PO CONC
0.5000 mg | ORAL | Status: DC | PRN
  Filled 2021-05-23: qty 0.3

## 2021-05-23 MED ORDER — ONDANSETRON HCL 4 MG/2ML IJ SOLN: 4.0000 mg | Freq: Four times a day (QID) | INTRAMUSCULAR | Status: DC | PRN

## 2021-05-23 MED ORDER — GLYCOPYRROLATE 1 MG PO TABS
1.0000 mg | ORAL_TABLET | ORAL | Status: DC | PRN
  Filled 2021-05-23: qty 1

## 2021-05-23 MED ORDER — HALOPERIDOL 0.5 MG PO TABS
0.5000 mg | ORAL_TABLET | ORAL | Status: DC | PRN
  Filled 2021-05-23: qty 1

## 2021-05-23 MED ORDER — POLYVINYL ALCOHOL 1.4 % OP SOLN
1.0000 [drp] | Freq: Four times a day (QID) | OPHTHALMIC | Status: DC | PRN
  Filled 2021-05-23: qty 15

## 2021-05-23 NOTE — Progress Notes (Signed)
PROGRESS NOTE    Angela Sullivan  TIW:[id number] DOB: 1940/09/25 DOA: 05/09/2021 PCP: Kandyce Rud, MD   Brief Narrative: Angela Sullivan is a 80 y.o. female with a history of hypertension and hyperlipidemia. Patient presented after being found down, hypothermic with evidence of rhabdomyolysis, acute encephalopathy, renal failure and bloody stools. Patient underwent EGD with injection/clips for duodenal ulcers. While admitted, she developed worsening hypoxia with concern for possible fluid overload. Transthoracic Echocardiogram obtained which shows significant aortic valve stenosis. Patient continued to have persistent hypoxia and hypercapnia, not improving on Bipap and she was transitioned to comfort measures only.   Assessment & Plan:   Principal Problem:   Upper GI bleed Active Problems:   Hyponatremia   High cholesterol   Hypertension   Fall at home, initial encounter   New onset atrial fibrillation (HCC)   Elevated troponin   Abnormal LFTs   Rhabdomyolysis   Hypokalemia   SIRS (systemic inflammatory response syndrome) (HCC)   Hypothermia   Pressure injury of skin   Acute metabolic encephalopathy   AKI (acute kidney injury) (HCC)   Anasarca   Acute respiratory failure with hypoxia (HCC)   Leukocytosis   SOB (shortness of breath)   Goals of care Patient without family. Discussion with patient's close friends. Patient noted to have persistent acidemia and hypercarbia on BiPAP. Decision made to transition to comfort measures as prolonged NIV was against patient's wishes. Request for chaplain prior to full comfort measures and transition off of BiPAP -Chaplain, then full comfort measures  Acute respiratory failure with hypoxia and hypercapnia Non-specific imaging. Possible pneumonia vs atelectasis vs vascular congestion. Concern for fluid overload. Patient started on IV diuretics without improvement. BiPAP required for acidemia and associated hypercapnia. Patient unable to  wean off of BiPAP and transitioned to comfort measures.  Atrial fibrillation Newly diagnosis. No anticoagulation started secondary to GI bleeding.  Acute metabolic encephalopathy Initially resolved but recurrent secondary to hypoxia.  Hypothermia Present on admission. Found down.  Upper GI bleed Gastroenterology consulted. Upper endoscopy performed on 12/20 duodenal ulcers noted, status post injection and clips. Patient managed on Protonix IV drip.  AKI Peak creatinine of 1.44.  Rhabdomyolysis CK of 1181 on admission. Resolved with IV fluids.  Hyponatremia Mild. Improved with IV fluids.  Hypokalemia Mild. Resolved with repletion.  Primary hypertension Managed on metoprolol.   DVT prophylaxis: SCDs Code Status:   Code Status: DNR Family Communication: Friend at bedside Disposition Plan: Comfort measures. Anticipate in-hospital death   Consultants:  Gastroenterology PCCM Nephrology Cardiology  Procedures:  UPPER ENDOSCOPY (04/27/2021) Impression:            - Non-obstructing non-bleeding duodenal ulcer with a                         nonbleeding visible vessel (Forrest Class IIa).                         Suspicious for NSAID induced etiology. There is no                         evidence of perforation. Injected. Clips (MR                         conditional) were placed.                        -  Non-bleeding duodenal ulcers with a clean ulcer base                         (Forrest Class III).                        - Normal stomach.                        - Normal gastroesophageal junction and esophagus.                        - No specimens collected. Recommendation:        - Return patient to ICU for ongoing care.                        - NPO today.                        - Continue present medications.                        - Give Protonix (pantoprazole): initiate therapy with                         80 mg IV bolus, then 8 mg/hr IV by continuous infusion                          for 3 days.  Antimicrobials: Ceftriaxone IV Cefepime IV Unasyn IV Vancomycin IV    Subjective: Patient is obtunded  Objective: Vitals:   05/22/21 2345 05/23/21 0300 05/23/21 0736 05/23/21 1106  BP:  112/78 112/72 135/65  Pulse: (!) 108 (!) 115 (!) 102 (!) 104  Resp: Temp:  (!) 97.4 F (36.3 C) 97.6 F (36.4 C) 98.1 F (36.7 C)  TempSrc:  Axillary Axillary Axillary  SpO2: 95% 95% 97% 96%  Weight:  78.7 kg    Height:        Intake/Output Summary (Last 24 hours) at 05/23/2021 1136 Last data filed at 05/23/2021 1107 Gross per 24 hour  Intake 446.97 ml  Output 700 ml  Net -253.03 ml   Filed Weights   05/15/21 0300 05/16/21 0438 05/23/21 0300  Weight: 91.2 kg 93.2 kg 78.7 kg    Examination:  General exam: Appears calm and comfortable. BiPAP attached Respiratory system: Diminished to auscultation. Respiratory effort normal. Cardiovascular system: S1 & S2 heard, RRR. Gastrointestinal system: Abdomen is nondistended, soft and nontender. No organomegaly or masses felt. Normal bowel sounds heard. Central nervous system: Not alert or oriented. Musculoskeletal: No calf tenderness Skin: No cyanosis. No rashes     Data Reviewed: I have personally reviewed following labs and imaging studies  CBC Lab Results  Component Value Date   WBC 27.6 (H) 05/22/2021   RBC 2.50 (L) 05/22/2021   HGB 8.9 (L) 05/22/2021   HCT 28.7 (L) 05/22/2021   MCV 114.8 (H) 05/22/2021   MCH 35.6 (H) 05/22/2021   PLT 172 05/22/2021   MCHC 31.0 05/22/2021   RDW 19.5 (H) 05/22/2021   LYMPHSABS 0.6 (L) 05/20/2021   MONOABS 1.2 (H) 05/20/2021   EOSABS 0.0 05/20/2021   BASOSABS 0.1 05/20/2021     Last metabolic panel Lab Results  Component Value Date   NA 138  05/22/2021   K 3.8 05/22/2021   CL 98 05/22/2021   CO2 29 05/22/2021   BUN 44 (H) 05/22/2021   CREATININE 1.44 (H) 05/22/2021   GLUCOSE 105 (H) 05/22/2021   GFRNONAA 37 (L) 05/22/2021   GFRAA >60  03/26/2017   CALCIUM 8.4 (L) 05/22/2021   PHOS 2.4 (L) 05/13/2021   PROT 5.5 (L) 05/19/2021   ALBUMIN 2.7 (L) 05/19/2021   BILITOT 1.9 (H) 05/19/2021   ALKPHOS 109 05/19/2021   AST 24 05/19/2021   ALT 23 05/19/2021   ANIONGAP 11 05/22/2021    CBG (last 3)  Recent Labs    05/22/21 0534 05/22/21 0732 05/22/21 1204  GLUCAP 108* 102* 113*     GFR: Estimated Creatinine Clearance: 29.7 mL/min (A) (by C-G formula based on SCr of 1.44 mg/dL (H)).  Coagulation Profile: No results for input(s): INR, PROTIME in the last 168 hours.  Recent Results (from the past 240 hour(s))  MRSA Next Gen by PCR, Nasal     Status: None   Collection Time: 05/21/21 10:45 AM   Specimen: Nasal Mucosa; Nasal Swab  Result Value Ref Range Status   MRSA by PCR Next Gen NOT DETECTED NOT DETECTED Final    Comment: (NOTE) The GeneXpert MRSA Assay (FDA approved for NASAL specimens only), is one component of a comprehensive MRSA colonization surveillance program. It is not intended to diagnose MRSA infection nor to guide or monitor treatment for MRSA infections. Test performance is not FDA approved in patients less than 37 years old. Performed at [facility], [address], [place], [place] [address]         Radiology Studies: ECHOCARDIOGRAM COMPLETE  Result Date: 05/21/2021    ECHOCARDIOGRAM REPORT   Patient Name:   JACK MINEAU Date of Exam: 05/21/2021 Medical Rec #:  [id number]     Height:       62.0 in Accession #:    [id number]    Weight:       205.5 lb Date of Birth:  Mar 26, 1941    BSA:          1.934 m Patient Age:    80 years      BP:           110/78 mmHg Patient Gender: F             HR:           100 bpm. Exam Location:  [facility] Procedure: 2D Echo, Cardiac Doppler and Color Doppler Indications:     CHF I50.9  History:         Patient has no prior history of Echocardiogram examinations.                  Risk Factors:Hypertension.  Sonographer:     Cristela Blue Referring Phys:  [id number]  BRENDA MORRISON Diagnosing Phys: Yvonne Kendall MD  Sonographer Comments: Suboptimal apical window. Pt on Bipap and non-verbal---pushing against my scan hand. IMPRESSIONS  1. Left ventricular ejection fraction, by estimation, is 55 to 60%. The left ventricle has normal function. The left ventricle demonstrates regional wall motion abnormalities (see scoring diagram/findings for description). There is moderate left ventricular hypertrophy. Left ventricular diastolic parameters are indeterminate. There is mild hypokinesis of the left ventricular, entire septal wall.  2. Right ventricular systolic function is low normal. The right ventricular size is normal. There is mildly elevated pulmonary artery systolic pressure.  3. Left atrial size was mildly dilated.  4. Right atrial size was mildly  dilated.  5. The mitral valve is degenerative. Moderate mitral valve regurgitation. No evidence of mitral stenosis.  6. Tricuspid valve regurgitation is moderate to severe.  7. The aortic valve has an indeterminant number of cusps. There is severe calcifcation of the aortic valve. There is severe thickening of the aortic valve. Aortic valve regurgitation is mild. Severe aortic valve stenosis. Aortic valve area, by VTI measures 0.45 cm. Aortic valve mean gradient measures 41.0 mmHg.  8. The inferior vena cava is dilated in size with <50% respiratory variability, suggesting right atrial pressure of 15 mmHg.  9. Rhythm strip during this exam demostrated atrial fibrillation. Comparison(s): No prior Echocardiogram. FINDINGS  Left Ventricle: Left ventricular ejection fraction, by estimation, is 55 to 60%. The left ventricle has normal function. The left ventricle demonstrates regional wall motion abnormalities. Mild hypokinesis of the left ventricular, entire septal wall. The left ventricular internal cavity size was normal in size. There is moderate left ventricular hypertrophy. Left ventricular diastolic function could not be  evaluated due to atrial fibrillation. Left ventricular diastolic parameters are indeterminate. Right Ventricle: The right ventricular size is normal. No increase in right ventricular wall thickness. Right ventricular systolic function is low normal. There is mildly elevated pulmonary artery systolic pressure. The tricuspid regurgitant velocity is 2.50 m/s, and with an assumed right atrial pressure of 15 mmHg, the estimated right ventricular systolic pressure is 40.0 mmHg. Left Atrium: Left atrial size was mildly dilated. Right Atrium: Right atrial size was mildly dilated. Pericardium: Trivial pericardial effusion is present. Mitral Valve: The mitral valve is degenerative in appearance. There is moderate thickening of the mitral valve leaflet(s). There is mild calcification of the mitral valve leaflet(s). Mild to moderate mitral annular calcification. Moderate mitral valve regurgitation. No evidence of mitral valve stenosis. MV peak gradient, 8.4 mmHg. The mean mitral valve gradient is 4.0 mmHg. Tricuspid Valve: The tricuspid valve is not well visualized. Tricuspid valve regurgitation is moderate to severe. Aortic Valve: The aortic valve has an indeterminant number of cusps. There is severe calcifcation of the aortic valve. There is severe thickening of the aortic valve. Aortic valve regurgitation is mild. Severe aortic stenosis is present. Aortic valve mean gradient measures 41.0 mmHg. Aortic valve peak gradient measures 70.4 mmHg. Aortic valve area, by VTI measures 0.45 cm. Pulmonic Valve: The pulmonic valve was not well visualized. Pulmonic valve regurgitation is not visualized. No evidence of pulmonic stenosis. Aorta: The aortic root is normal in size and structure. Pulmonary Artery: The pulmonary artery is not well seen. Venous: The inferior vena cava is dilated in size with less than 50% respiratory variability, suggesting right atrial pressure of 15 mmHg. IAS/Shunts: The interatrial septum was not well  visualized. EKG: Rhythm strip during this exam demostrated atrial fibrillation.  LEFT VENTRICLE PLAX 2D LVIDd:         4.10 cm   Diastology LVIDs:         2.90 cm   LV e' medial:    4.24 cm/s LV PW:         1.30 cm   LV E/e' medial:  29.7 LV IVS:        1.05 cm   LV e' lateral:   6.42 cm/s LVOT diam:     2.00 cm   LV E/e' lateral: 19.6 LV SV:         32 LV SV Index:   17 LVOT Area:     3.14 cm  RIGHT VENTRICLE RV Basal diam:  4.10 cm RV  S prime:     7.51 cm/s LEFT ATRIUM             Index        RIGHT ATRIUM           Index LA diam:        4.20 cm 2.17 cm/m   RA Area:     23.80 cm LA Vol (A2C):   78.1 ml 40.39 ml/m  RA Volume:   75.40 ml  38.99 ml/m LA Vol (A4C):   70.4 ml 36.41 ml/m LA Biplane Vol: 76.1 ml 39.35 ml/m  AORTIC VALVE                     PULMONIC VALVE AV Area (Vmax):    0.42 cm      PV Vmax:        0.76 m/s AV Area (Vmean):   0.40 cm      PV Vmean:       48.700 cm/s AV Area (VTI):     0.45 cm      PV VTI:         0.122 m AV Vmax:           419.67 cm/s   PV Peak grad:   2.3 mmHg AV Vmean:          293.000 cm/s  PV Mean grad:   1.0 mmHg AV VTI:            0.719 m       RVOT Peak grad: 4 mmHg AV Peak Grad:      70.4 mmHg AV Mean Grad:      41.0 mmHg LVOT Vmax:         55.50 cm/s LVOT Vmean:        37.600 cm/s LVOT VTI:          0.102 m LVOT/AV VTI ratio: 0.14  AORTA Ao Root diam: 2.93 cm MITRAL VALVE                TRICUSPID VALVE MV Area (PHT): 6.43 cm     TR Peak grad:   25.0 mmHg MV Area VTI:   1.05 cm     TR Vmax:        250.00 cm/s MV Peak grad:  8.4 mmHg MV Mean grad:  4.0 mmHg     SHUNTS MV Vmax:       1.45 m/s     Systemic VTI:  0.10 m MV Vmean:      95.8 cm/s    Systemic Diam: 2.00 cm MV Decel Time: 118 msec     Pulmonic VTI:  0.138 m MV E velocity: 126.00 cm/s MV A velocity: 93.00 cm/s MV E/A ratio:  1.35 Cristal [place] MD Electronically signed by Yvonne Kendall MD Signature Date/Time: 05/21/2021/4:12:07 PM    Final         Scheduled Meds:  chlorhexidine  15 mL Mouth Rinse  BID   Chlorhexidine Gluconate Cloth  6 each Topical Q0600   feeding supplement  237 mL Oral TID BM   fluticasone  2 spray Each Nare Daily   folic acid  1 mg Oral Daily   furosemide  40 mg Intravenous BID   mouth rinse  15 mL Mouth Rinse q12n4p   multivitamin with minerals  1 tablet Oral Daily   pantoprazole (PROTONIX) IV  40 mg Intravenous Q12H   polyethylene glycol  17 g Oral Daily   Continuous Infusions:  sodium  chloride Stopped (05/22/21 2322)   ceFEPime (MAXIPIME) IV 2 g (05/23/21 0903)     LOS: 13 days     Jacquelin Hawking, MD [facility] 05/23/2021, 11:36 AM  If 7PM-7AM, please contact night-coverage [email or web address]

## 2021-05-23 NOTE — Progress Notes (Signed)
Bipap removed from patient, placed on 2lpm [place]

## 2021-05-23 NOTE — Progress Notes (Signed)
OT Cancellation Note  Patient Details Name: Angela Sullivan MRN: [id number] DOB: 1940/05/30   Cancelled Treatment:    Reason Eval/Treat Not Completed: Medical issues which prohibited therapy;Other (comment). OT order received. Pt remains on continuous BIPAP (as noted yesterday in OT cancellation note). Per chart, family considering comfort measures. OT will sign off at this time. Please re-consult if pt becomes medically appropriate for OT services.   Rockney Ghee, M.S., OTR/L Feeding Team - [facility] Ascom: [phone] 05/23/21, 11:30 AM

## 2021-05-23 NOTE — Progress Notes (Signed)
PT Cancellation Note  Patient Details Name: Angela Sullivan MRN: [id number] DOB: [birth year]   Cancelled Treatment:    Reason Eval/Treat Not Completed: Patient not medically ready PT re-evaluation orders received. Pt noted to be on continuous bipap. PT has attempted to re-evaluate pt 3 times without success. Per protocol PT will sign off, please place new consult orders if pt becomes appropriate for PT intervention.  Of note, per chart, family considering transitioning pt to comfort care.  Aleda Grana, PT, DPT 05/23/21, 8:05 AM    Sandi Mariscal 05/23/2021, 8:03 AM

## 2021-05-24 DEATH — deceased

## 2021-06-24 NOTE — Death Summary Note (Signed)
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 aortic stenosis  Hospital Diagnoses: Principal Problem:   Upper GI bleed Active Problems:   Hyponatremia   High cholesterol   Hypertension   Fall at home, initial encounter   New onset atrial fibrillation (HCC)   Elevated troponin   Rhabdomyolysis   Hypokalemia   Hypothermia   Pressure injury of skin   Acute metabolic encephalopathy   AKI (acute kidney injury) (HCC)   Acute respiratory failure with hypoxia and hypercapnia (HCC)   Severe aortic stenosis   Acute diastolic CHF        Hospital Course: Angela Heritageolly Lundberg was an 81 year old female with past medical history significant for essential hypertension, hyperlipidemia who [facility] on 12/18 after being found down at home.  Upon evaluation in ED patient was noted to be hypothermic with severe hyponatremia, confusion, and acute renal failure.    After two days in the hospital, she was noted to have melena and worsening anemia.  GI, General Surgery, and Vascular surgery were consulted and she was found to have a duodenal ulcer with visible vessel, treated endoscopically.    She subsequently developed progressive swelling, congestive heart failure and respiratory failure with acute hypoxic and hypercapnic respiratory failure requiring BiPAP.  An echo was performed that showed severe aortic stenosis.  Cardiology were consulted, but the patient was not a candidate for AVR and her condition deteriorated despite aggressive medical treatment and she passed away.        * Upper GI bleed- (present on admission)   AKI (acute kidney injury) due to rhabdomyolysis -  (present on admission)  Acute metabolic encephalopathy- (present on admission) At baseline, patient lived independently, cared for self.  At admission she was confused and unresponsive.    Acute diastolic Congestive heart failure  Acute respiratory failure with hypoxia and hypercapnia  Hypothermia- (present on admission)  Hypokalemia- (present on admission) Repleted and resolved  Rhabdomyolysis- (present on admission) CK level on admission was 1185 likely from fall at home.  Improved with hydration  New onset paroxysmal atrial fibrillation (HCC)    Essential Hypertension- (present on admission)    Hyperlipidemia  Hyponatremia- (present on admission) Resolved with hypertonic saline.             The results of significant diagnostics from this hospitalization (including imaging, microbiology, ancillary and laboratory) are listed below for reference.   Significant Diagnostic Studies: DG Chest 1 View  Result Date: 05/07/2021 CLINICAL DATA:  81 year old female with shortness of breath and abdominal distension. EXAM: CHEST  1 VIEW COMPARISON:  Portable chest 05/08/2021 and earlier. FINDINGS: Portable AP semi upright view at 0459 hours. More rotated to the right. Stable cardiomegaly and mediastinal contours. Stable lung volumes. Allowing for portable technique the lungs are clear. No pneumothorax or pleural effusion identified. No acute osseous abnormality identified. Negative visible bowel gas. IMPRESSION: Cardiomegaly.  No acute cardiopulmonary abnormality. Electronically Signed   By: Odessa FlemingH  Hall M.D.   On: 05/11/2021 05:34   DG Forearm Right  Result Date: 04/23/2021 CLINICAL DATA:  Patient coming [facility] from home for unwitnessed fall. Patient reportedly fell once yesterday as well; EMS helped patient up, but  patient refused transport to hospital for first fall. Patient reports down for second fall for 8 hours. Pain and bruising down right arm and hip. Pt unable to verbalize  coherently at this point, unable to obtain history. EXAM: RIGHT FOREARM - 2 VIEW COMPARISON:  None. FINDINGS: No fracture or bone lesion. Elbow and wrist joints are normally aligned. There is diffuse subcutaneous soft tissue edema, nonspecific. IMPRESSION: No fracture or dislocation. Electronically Signed   By: Amie Portland M.D.   On: 05/18/2021 10:52   DG Abd 1 View  Result Date: 11-Jun-2021 CLINICAL DATA:  81 year old female with shortness of breath and abdominal distension. EXAM: ABDOMEN - 1 VIEW COMPARISON:  CT Abdomen and Pelvis 01/28/2006. FINDINGS: Portable AP supine view at 0502 hours. Moderately distended gas-filled stomach. Gas containing but nondilated large bowel in the abdomen and visible pelvis. A few nondilated gas-filled small bowel loops are visible in the right lower abdomen. No definite pneumoperitoneum on this supine view. Visible lung bases appear negative. No acute osseous abnormality identified. IMPRESSION: 1. Moderately gas distended stomach. Consider NG tube decompression. 2. But elsewhere the bowel-gas pattern is within normal limits. Electronically Signed   By: Odessa Fleming M.D.   On: 2021/06/11 05:35   CT HEAD WO CONTRAST ( )  Result Date: 05/03/2021 CLINICAL DATA:  Fall.  Periorbital swelling.  Neck pain. EXAM: CT HEAD WITHOUT CONTRAST CT MAXILLOFACIAL WITHOUT CONTRAST CT CERVICAL SPINE WITHOUT CONTRAST TECHNIQUE: Multidetector CT imaging of the head, cervical spine, and maxillofacial structures were performed using the standard protocol without intravenous contrast. Multiplanar CT image reconstructions of the cervical spine and maxillofacial structures were also generated. COMPARISON:  None. FINDINGS: CT HEAD FINDINGS Brain: No evidence of acute infarction, hemorrhage, hydrocephalus, extra-axial collection or mass lesion/mass effect. Patchy white matter hypoattenuation noted bilaterally consistent with mild chronic microvascular ischemic change. Vascular: No hyperdense vessel or  unexpected calcification. Skull: Normal. Negative for fracture or focal lesion. Other: None. CT MAXILLOFACIAL FINDINGS Osseous: No fracture or mandibular dislocation. No destructive process. Orbits: Right preseptal periorbital soft tissue swelling/hemorrhage. No injury to the right globe or postseptal orbit. Normal left globe and orbit. Sinuses: Minimal dependent fluid in the left maxillary sinus. Remaining sinuses are clear. Clear mastoid air cells and middle ear cavities. Soft tissues: Soft tissue swelling extends from the right periorbital region across the right cheek. CT CERVICAL SPINE FINDINGS Alignment: Straightened cervical lordosis.  No spondylolisthesis. Skull base and vertebrae: No acute fracture. No primary bone lesion or focal pathologic process. Soft tissues and spinal canal: No prevertebral fluid or swelling. No visible canal hematoma. Disc levels: Mild loss of disc height at C4-C5. Moderate loss of disc height at C5-C6 and C6-C7. Mild spondylotic disc bulging with endplate spurring at these levels. No convincing disc herniation. Bilateral facet degenerative changes, greater on the left. Upper chest: No acute findings. Partly calcified apical pleuroparenchymal scarring. Other: None. IMPRESSION: HEAD CT 1. No acute intracranial abnormalities.  No skull fracture. MAXILLOFACIAL CT 1. No fractures. 2. Right preseptal periorbital soft tissue swelling/hemorrhage extending across the right cheek. No injury to the right globe or postseptal orbit. CERVICAL CT 1. No fracture or acute finding. Electronically Signed   By: Amie Portland M.D.   On: 04/27/2021 10:19   CT Angio Chest Pulmonary Embolism (PE) W or WO Contrast  Result Date: 05/20/2021 CLINICAL DATA:  Concern for pulmonary embolism. EXAM: CT ANGIOGRAPHY CHEST WITH CONTRAST TECHNIQUE: Multidetector CT imaging of the chest was performed using the standard protocol during bolus administration of intravenous  contrast. Multiplanar CT image  reconstructions and MIPs were obtained to evaluate the vascular anatomy. CONTRAST:  75mL OMNIPAQUE IOHEXOL 350 MG/ML SOLN COMPARISON:  Chest radiograph dated 05/20/2021. FINDINGS: Cardiovascular: Mild cardiomegaly. No pericardial effusion. Three-vessel coronary vascular calcification. There is retrograde flow of contrast from the right atrium into the IVC suggestive of right heart dysfunction. Correlation with echocardiogram recommended. There is advanced atherosclerotic calcification of the thoracic aorta. No aneurysmal dilatation. Evaluation of the pulmonary arteries is limited due to respiratory motion artifact and suboptimal visualization of the peripheral branches. No large central pulmonary artery embolus identified. Mediastinum/Nodes: No hilar or mediastinal adenopathy. The esophagus is grossly unremarkable. No mediastinal fluid collection. Lungs/Pleura: Moderate right and trace left pleural effusions. There is partial consolidation of the right lower lobe which may represent compressive atelectasis or pneumonia. Underlying mass is not excluded clinical correlation and follow-up to resolution recommended. Biapical subpleural calcified plaques. No pneumothorax. The central airways are patent. Upper Abdomen: Small ascites. Musculoskeletal: Diffuse subcutaneous edema. Osteopenia with degenerative changes of the spine. No acute osseous pathology. Review of the MIP images confirms the above findings. IMPRESSION: 1. No CT evidence of central pulmonary artery embolus. 2. Moderate right and trace left pleural effusions with partial consolidation of the right lower lobe which may represent compressive atelectasis or pneumonia. Underlying mass is not excluded clinical correlation and follow-up to resolution recommended. 3. Mild cardiomegaly with evidence of right heart dysfunction. Correlation with echocardiogram recommended. 4. Small ascites and diffuse subcutaneous edema. 5. Aortic Atherosclerosis (ICD10-I70.0).  Electronically Signed   By: Elgie Collard M.D.   On: 05/20/2021 23:06   CT Cervical Spine Wo Contrast  Result Date: 17-May-2021 CLINICAL DATA:  Fall.  Periorbital swelling.  Neck pain. EXAM: CT HEAD WITHOUT CONTRAST CT MAXILLOFACIAL WITHOUT CONTRAST CT CERVICAL SPINE WITHOUT CONTRAST TECHNIQUE: Multidetector CT imaging of the head, cervical spine, and maxillofacial structures were performed using the standard protocol without intravenous contrast. Multiplanar CT image reconstructions of the cervical spine and maxillofacial structures were also generated. COMPARISON:  None. FINDINGS: CT HEAD FINDINGS Brain: No evidence of acute infarction, hemorrhage, hydrocephalus, extra-axial collection or mass lesion/mass effect. Patchy white matter hypoattenuation noted bilaterally consistent with mild chronic microvascular ischemic change. Vascular: No hyperdense vessel or unexpected calcification. Skull: Normal. Negative for fracture or focal lesion. Other: None. CT MAXILLOFACIAL FINDINGS Osseous: No fracture or mandibular dislocation. No destructive process. Orbits: Right preseptal periorbital soft tissue swelling/hemorrhage. No injury to the right globe or postseptal orbit. Normal left globe and orbit. Sinuses: Minimal dependent fluid in the left maxillary sinus. Remaining sinuses are clear. Clear mastoid air cells and middle ear cavities. Soft tissues: Soft tissue swelling extends from the right periorbital region across the right cheek. CT CERVICAL SPINE FINDINGS Alignment: Straightened cervical lordosis.  No spondylolisthesis. Skull base and vertebrae: No acute fracture. No primary bone lesion or focal pathologic process. Soft tissues and spinal canal: No prevertebral fluid or swelling. No visible canal hematoma. Disc levels: Mild loss of disc height at C4-C5. Moderate loss of disc height at C5-C6 and C6-C7. Mild spondylotic disc bulging with endplate spurring at these levels. No convincing disc herniation.  Bilateral facet degenerative changes, greater on the left. Upper chest: No acute findings. Partly calcified apical pleuroparenchymal scarring. Other: None. IMPRESSION: HEAD CT 1. No acute intracranial abnormalities.  No skull fracture. MAXILLOFACIAL CT 1. No fractures. 2. Right preseptal periorbital soft tissue swelling/hemorrhage extending across the right cheek. No injury to the right globe or postseptal orbit. CERVICAL CT 1. No fracture  or acute finding. Electronically Signed   By: Amie Portland M.D.   On: 05/21/2021 10:19   NM GI Blood Loss  Result Date: 05/18/2021 CLINICAL DATA:  Bright red blood per rectum also mixed with melena by report obtained from nursing staff in nuclear medicine during the examination. EXAM: NUCLEAR MEDICINE GASTROINTESTINAL BLEEDING SCAN TECHNIQUE: Sequential abdominal images were obtained following intravenous administration of Tc-40m labeled red blood cells. RADIOPHARMACEUTICALS:  20.92 mCi Tc-42m pertechnetate in-vitro labeled red cells. COMPARISON:  No recent comparison imaging is available. There is imaging from 2007. FINDINGS: Initial images show some activity in the LEFT lower quadrant. Subtle activity noted over the descending duodenum/midline early in the examination as well. Later in the examination more pronounced activity is demonstrated over the expected location of the descending duodenum and serpiginous appearance of numerous areas of activity over the LEFT hemiabdomen most suggestive of labelled blood products filling jejunal loops beyond suspected site of bleeding in the proximal and or descending duodenum. IMPRESSION: Signs of active bleeding favored to originate from the descending duodenum with blood products passing into the jejunum on more delayed images and intermittently throughout the scan. Critical Value/emergent results were called by telephone at the time of interpretation on 05-18-2021 at 12:30 pm to provider Harlon Ditty, NP , who verbally  acknowledged these results. Electronically Signed   By: Donzetta Kohut M.D.   On: 05-18-2021 12:31   DG Hand 2 View Right  Result Date: 05/05/2021 CLINICAL DATA:  Patient coming [facility] from home for unwitnessed fall. Patient reportedly fell once yesterday as well; EMS helped patient up, but patient refused transport to hospital for first fall. Patient reports down for second fall for 8 hours. Pain and bruising down right arm and hip. Pt unable to verbalize coherently at this point, unable to obtain history. EXAM: RIGHT HAND - 2 VIEW COMPARISON:  None. FINDINGS: No fracture or bone lesion. Arthropathic changes involving multiple joints, most prominently the DIP joint of the middle finger, pattern consistent with osteoarthritis. Joints are normally aligned. Soft tissues are unremarkable. IMPRESSION: 1. No fracture or dislocation. 2. Osteoarthritis. Electronically Signed   By: Amie Portland M.D.   On: 05/04/2021 10:53   DG Chest Port 1 View  Result Date: 05/20/2021 CLINICAL DATA:  Shortness of breath EXAM: PORTABLE CHEST 1 VIEW COMPARISON:  07/13/2020 FINDINGS: Moderate enlargement of the cardiopericardial silhouette noted with atherosclerotic calcification of the aortic arch. Hazy density in the right mid lung potentially from fluid or atelectasis within or along the minor fissure. Mild right retrocardiac airspace opacity along with accentuated density along the right diaphragm. The left lung appears clear. Biapical calcifications, likely chronic postinflammatory. 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 IMPRESSION: 1. Indistinct band of airspace opacity in the right midlung with right retrocardiac and right retro diaphragmatic airspace opacity. This could be from pleural effusion with associated atelectasis; pneumonia is a differential diagnostic consideration. 2. Moderate enlargement of the cardiopericardial silhouette, without edema. 3.  Aortic Atherosclerosis (ICD10-I70.0). 4.  Biapical pleuroparenchymal scarring with calcifications. 5. Asymmetric left degenerative glenohumeral arthropathy Electronically Signed   By: Gaylyn Rong M.D.   On: 05/20/2021 18:01   DG Chest Port 1 View  Result Date: 05/06/2021 CLINICAL DATA:  Patient coming [facility] from home for unwitnessed fall. Patient reportedly fell once yesterday as well; EMS helped patient up, but patient refused transport to hospital for first fall. Patient reports down for second fall for 8 hours. Pain and bruising down right arm and hip. Pt unable to  verbalize coherently at this point, unable to obtain history. EXAM: PORTABLE CHEST 1 VIEW COMPARISON:  03/21/2017 FINDINGS: Mild enlargement of the cardiopericardial silhouette. No mediastinal or hilar masses. Clear lungs.  No convincing pleural effusion or pneumothorax. Skeletal structures are grossly intact. IMPRESSION: No active disease. Electronically Signed   By: Amie Portlandavid  Ormond M.D.   On: 05/18/2021 10:50   DG Humerus Right  Result Date: 05/13/2021 CLINICAL DATA:  Patient coming [facility] from home for unwitnessed fall. Patient reportedly fell once yesterday as well; EMS helped patient up, but patient refused transport to hospital for first fall. Patient reports down for second fall for 8 hours. Pain and bruising down right arm and hip. Pt unable to verbalize coherently at this point, unable to obtain history. EXAM: RIGHT HUMERUS - 2+ VIEW COMPARISON:  None. FINDINGS: No fracture or bone lesion. Glenohumeral and elbow joints are normally aligned. Soft tissues are unremarkable. IMPRESSION: No fracture or dislocation. Electronically Signed   By: Amie Portlandavid  Ormond M.D.   On: 05/03/2021 10:51   ECHOCARDIOGRAM COMPLETE  Result Date: 05/21/2021    ECHOCARDIOGRAM REPORT   Patient Name:   Angela HeritageOLLY Lafavor Date of Exam: 05/21/2021 Medical Rec #:  [id number]     Height:       62.0 in Accession #:    [id number]    Weight:       205.5 lb Date of Birth:  05/08/1941    BSA:          1.934 m  Patient Age:    80 years      BP:           110/78 mmHg Patient Gender: F             HR:           100 bpm. Exam Location:  [facility] Procedure: 2D Echo, Cardiac Doppler and Color Doppler Indications:     CHF I50.9  History:         Patient has no prior history of Echocardiogram examinations.                  Risk Factors:Hypertension.  Sonographer:     Cristela BlueJerry Hege Referring Phys:  [id number] BRENDA MORRISON Diagnosing Phys: Yvonne Kendallhristopher End MD  Sonographer Comments: Suboptimal apical window. Pt on Bipap and non-verbal---pushing against my scan hand. IMPRESSIONS  1. Left ventricular ejection fraction, by estimation, is 55 to 60%. The left ventricle has normal function. The left ventricle demonstrates regional wall motion abnormalities (see scoring diagram/findings for description). There is moderate left ventricular hypertrophy. Left ventricular diastolic parameters are indeterminate. There is mild hypokinesis of the left ventricular, entire septal wall.  2. Right ventricular systolic function is low normal. The right ventricular size is normal. There is mildly elevated pulmonary artery systolic pressure.  3. Left atrial size was mildly dilated.  4. Right atrial size was mildly dilated.  5. The mitral valve is degenerative. Moderate mitral valve regurgitation. No evidence of mitral stenosis.  6. Tricuspid valve regurgitation is moderate to severe.  7. The aortic valve has an indeterminant number of cusps. There is severe calcifcation of the aortic valve. There is severe thickening of the aortic valve. Aortic valve regurgitation is mild. Severe aortic valve stenosis. Aortic valve area, by VTI measures 0.45 cm. Aortic valve mean gradient measures 41.0 mmHg.  8. The inferior vena cava is dilated in size with <50% respiratory variability, suggesting right atrial pressure of 15 mmHg.  9. Rhythm strip during this exam demostrated atrial  fibrillation. Comparison(s): No prior Echocardiogram. FINDINGS  Left Ventricle: Left  ventricular ejection fraction, by estimation, is 55 to 60%. The left ventricle has normal function. The left ventricle demonstrates regional wall motion abnormalities. Mild hypokinesis of the left ventricular, entire septal wall. The left ventricular internal cavity size was normal in size. There is moderate left ventricular hypertrophy. Left ventricular diastolic function could not be evaluated due to atrial fibrillation. Left ventricular diastolic parameters are indeterminate. Right Ventricle: The right ventricular size is normal. No increase in right ventricular wall thickness. Right ventricular systolic function is low normal. There is mildly elevated pulmonary artery systolic pressure. The tricuspid regurgitant velocity is 2.50 m/s, and with an assumed right atrial pressure of 15 mmHg, the estimated right ventricular systolic pressure is 40.0 mmHg. Left Atrium: Left atrial size was mildly dilated. Right Atrium: Right atrial size was mildly dilated. Pericardium: Trivial pericardial effusion is present. Mitral Valve: The mitral valve is degenerative in appearance. There is moderate thickening of the mitral valve leaflet(s). There is mild calcification of the mitral valve leaflet(s). Mild to moderate mitral annular calcification. Moderate mitral valve regurgitation. No evidence of mitral valve stenosis. MV peak gradient, 8.4 mmHg. The mean mitral valve gradient is 4.0 mmHg. Tricuspid Valve: The tricuspid valve is not well visualized. Tricuspid valve regurgitation is moderate to severe. Aortic Valve: The aortic valve has an indeterminant number of cusps. There is severe calcifcation of the aortic valve. There is severe thickening of the aortic valve. Aortic valve regurgitation is mild. Severe aortic stenosis is present. Aortic valve mean gradient measures 41.0 mmHg. Aortic valve peak gradient measures 70.4 mmHg. Aortic valve area, by VTI measures 0.45 cm. Pulmonic Valve: The pulmonic valve was not well visualized.  Pulmonic valve regurgitation is not visualized. No evidence of pulmonic stenosis. Aorta: The aortic root is normal in size and structure. Pulmonary Artery: The pulmonary artery is not well seen. Venous: The inferior vena cava is dilated in size with less than 50% respiratory variability, suggesting right atrial pressure of 15 mmHg. IAS/Shunts: The interatrial septum was not well visualized. EKG: Rhythm strip during this exam demostrated atrial fibrillation.  LEFT VENTRICLE PLAX 2D LVIDd:         4.10 cm   Diastology LVIDs:         2.90 cm   LV e' medial:    4.24 cm/s LV PW:         1.30 cm   LV E/e' medial:  29.7 LV IVS:        1.05 cm   LV e' lateral:   6.42 cm/s LVOT diam:     2.00 cm   LV E/e' lateral: 19.6 LV SV:         32 LV SV Index:   17 LVOT Area:     3.14 cm  RIGHT VENTRICLE RV Basal diam:  4.10 cm RV S prime:     7.51 cm/s LEFT ATRIUM             Index        RIGHT ATRIUM           Index LA diam:        4.20 cm 2.17 cm/m   RA Area:     23.80 cm LA Vol (A2C):   78.1 ml 40.39 ml/m  RA Volume:   75.40 ml  38.99 ml/m LA Vol (A4C):   70.4 ml 36.41 ml/m LA Biplane Vol: 76.1 ml 39.35 ml/m  AORTIC VALVE  PULMONIC VALVE AV Area (Vmax):    0.42 cm      PV Vmax:        0.76 m/s AV Area (Vmean):   0.40 cm      PV Vmean:       48.700 cm/s AV Area (VTI):     0.45 cm      PV VTI:         0.122 m AV Vmax:           419.67 cm/s   PV Peak grad:   2.3 mmHg AV Vmean:          293.000 cm/s  PV Mean grad:   1.0 mmHg AV VTI:            0.719 m       RVOT Peak grad: 4 mmHg AV Peak Grad:      70.4 mmHg AV Mean Grad:      41.0 mmHg LVOT Vmax:         55.50 cm/s LVOT Vmean:        37.600 cm/s LVOT VTI:          0.102 m LVOT/AV VTI ratio: 0.14  AORTA Ao Root diam: 2.93 cm MITRAL VALVE                TRICUSPID VALVE MV Area (PHT): 6.43 cm     TR Peak grad:   25.0 mmHg MV Area VTI:   1.05 cm     TR Vmax:        250.00 cm/s MV Peak grad:  8.4 mmHg MV Mean grad:  4.0 mmHg     SHUNTS MV Vmax:       1.45 m/s      Systemic VTI:  0.10 m MV Vmean:      95.8 cm/s    Systemic Diam: 2.00 cm MV Decel Time: 118 msec     Pulmonic VTI:  0.138 m MV E velocity: 126.00 cm/s MV A velocity: 93.00 cm/s MV E/A ratio:  1.35 Yvonne Kendall MD Electronically signed by Yvonne Kendall MD Signature Date/Time: 05/21/2021/4:12:07 PM    Final    DG HIP UNILAT WITH PELVIS 2-3 VIEWS RIGHT  Result Date: 05/27/2021 CLINICAL DATA:  Patient coming [facility] for unwitnessed fall. Patient reportedly fell once yesterday as well; EMS helped patient up, but patient refused transport to hospital for first fall. Patient reports down for second fall for 8 hours. Pain and bruising down right arm and hip. Pt unable to verbalize coherently at this point, unable to obtain history. EXAM: DG HIP (WITH OR WITHOUT PELVIS) 2-3V RIGHT COMPARISON:  None. FINDINGS: No fracture or bone lesion. Hip joints, SI joints and symphysis pubis are normally aligned. Soft tissues are unremarkable. IMPRESSION: 1. No fracture or dislocation. Electronically Signed   By: Amie Portland M.D.   On: May 27, 2021 10:50   CT Maxillofacial Wo Contrast  Result Date: 27-May-2021 CLINICAL DATA:  Fall.  Periorbital swelling.  Neck pain. EXAM: CT HEAD WITHOUT CONTRAST CT MAXILLOFACIAL WITHOUT CONTRAST CT CERVICAL SPINE WITHOUT CONTRAST TECHNIQUE: Multidetector CT imaging of the head, cervical spine, and maxillofacial structures were performed using the standard protocol without intravenous contrast. Multiplanar CT image reconstructions of the cervical spine and maxillofacial structures were also generated. COMPARISON:  None. FINDINGS: CT HEAD FINDINGS Brain: No evidence of acute infarction, hemorrhage, hydrocephalus, extra-axial collection or mass lesion/mass effect. Patchy white matter hypoattenuation noted bilaterally consistent with mild chronic microvascular ischemic change. Vascular: No hyperdense  vessel or unexpected calcification. Skull: Normal. Negative for fracture or focal  lesion. Other: None. CT MAXILLOFACIAL FINDINGS Osseous: No fracture or mandibular dislocation. No destructive process. Orbits: Right preseptal periorbital soft tissue swelling/hemorrhage. No injury to the right globe or postseptal orbit. Normal left globe and orbit. Sinuses: Minimal dependent fluid in the left maxillary sinus. Remaining sinuses are clear. Clear mastoid air cells and middle ear cavities. Soft tissues: Soft tissue swelling extends from the right periorbital region across the right cheek. CT CERVICAL SPINE FINDINGS Alignment: Straightened cervical lordosis.  No spondylolisthesis. Skull base and vertebrae: No acute fracture. No primary bone lesion or focal pathologic process. Soft tissues and spinal canal: No prevertebral fluid or swelling. No visible canal hematoma. Disc levels: Mild loss of disc height at C4-C5. Moderate loss of disc height at C5-C6 and C6-C7. Mild spondylotic disc bulging with endplate spurring at these levels. No convincing disc herniation. Bilateral facet degenerative changes, greater on the left. Upper chest: No acute findings. Partly calcified apical pleuroparenchymal scarring. Other: None. IMPRESSION: HEAD CT 1. No acute intracranial abnormalities.  No skull fracture. MAXILLOFACIAL CT 1. No fractures. 2. Right preseptal periorbital soft tissue swelling/hemorrhage extending across the right cheek. No injury to the right globe or postseptal orbit. CERVICAL CT 1. No fracture or acute finding. Electronically Signed   By: Amie Portland M.D.   On: 04/23/2021 10:19    Microbiology: Recent Results (from the past 240 hour(s))  MRSA Next Gen by PCR, Nasal     Status: None   Collection Time: 05/21/21 10:45 AM   Specimen: Nasal Mucosa; Nasal Swab  Result Value Ref Range Status   MRSA by PCR Next Gen NOT DETECTED NOT DETECTED Final    Comment: (NOTE) The GeneXpert MRSA Assay (FDA approved for NASAL specimens only), is one component of a comprehensive MRSA colonization  surveillance program. It is not intended to diagnose MRSA infection nor to guide or monitor treatment for MRSA infections. Test performance is not FDA approved in patients less than 67 years old. Performed at [facility], [address] [place] [place], [place] [address]       Signed: Alberteen Sam 06-02-21

## 2021-06-24 NOTE — Progress Notes (Addendum)
Upon entering room patient found to have agonal breathing, both valerie and Deb were called to make aware of decline. Unfortunately nether answered, message was left. Called stephanie in room for 2nd nurse, patient took her last breath and pronounced at 1327. Dr. Loleta Books on the floor and was made aware that patient had passed.

## 2021-06-24 NOTE — Progress Notes (Addendum)
Spoke with Deb to make aware patient had passed, per friend she believed the patient had arrangements with a funeral home, however she is currently unsure of which. She said she will call back as soon as she can find out the information. Also called [facility] to notify of patient passing, spoke with Barbette Or which stated patient is not a candidate to be a donor, patients reference number is [id number]

## 2021-06-24 NOTE — Progress Notes (Signed)
Spoke with patients friend, Janan Ridge who confirmed funeral home arrangements will be made through [facility] [phone]. Supervisor was made aware, paperwork was filled out. Transport to be called to take patient down to the morgue.

## 2021-06-24 DEATH — deceased

## 2023-06-07 IMAGING — DX DG CHEST 1V PORT
1 series · 1 of 1 positions shown · non-contrast
Comparison: 07/13/2020

CLINICAL DATA: Shortness of breath

EXAM:
PORTABLE CHEST 1 VIEW

[chest ap]
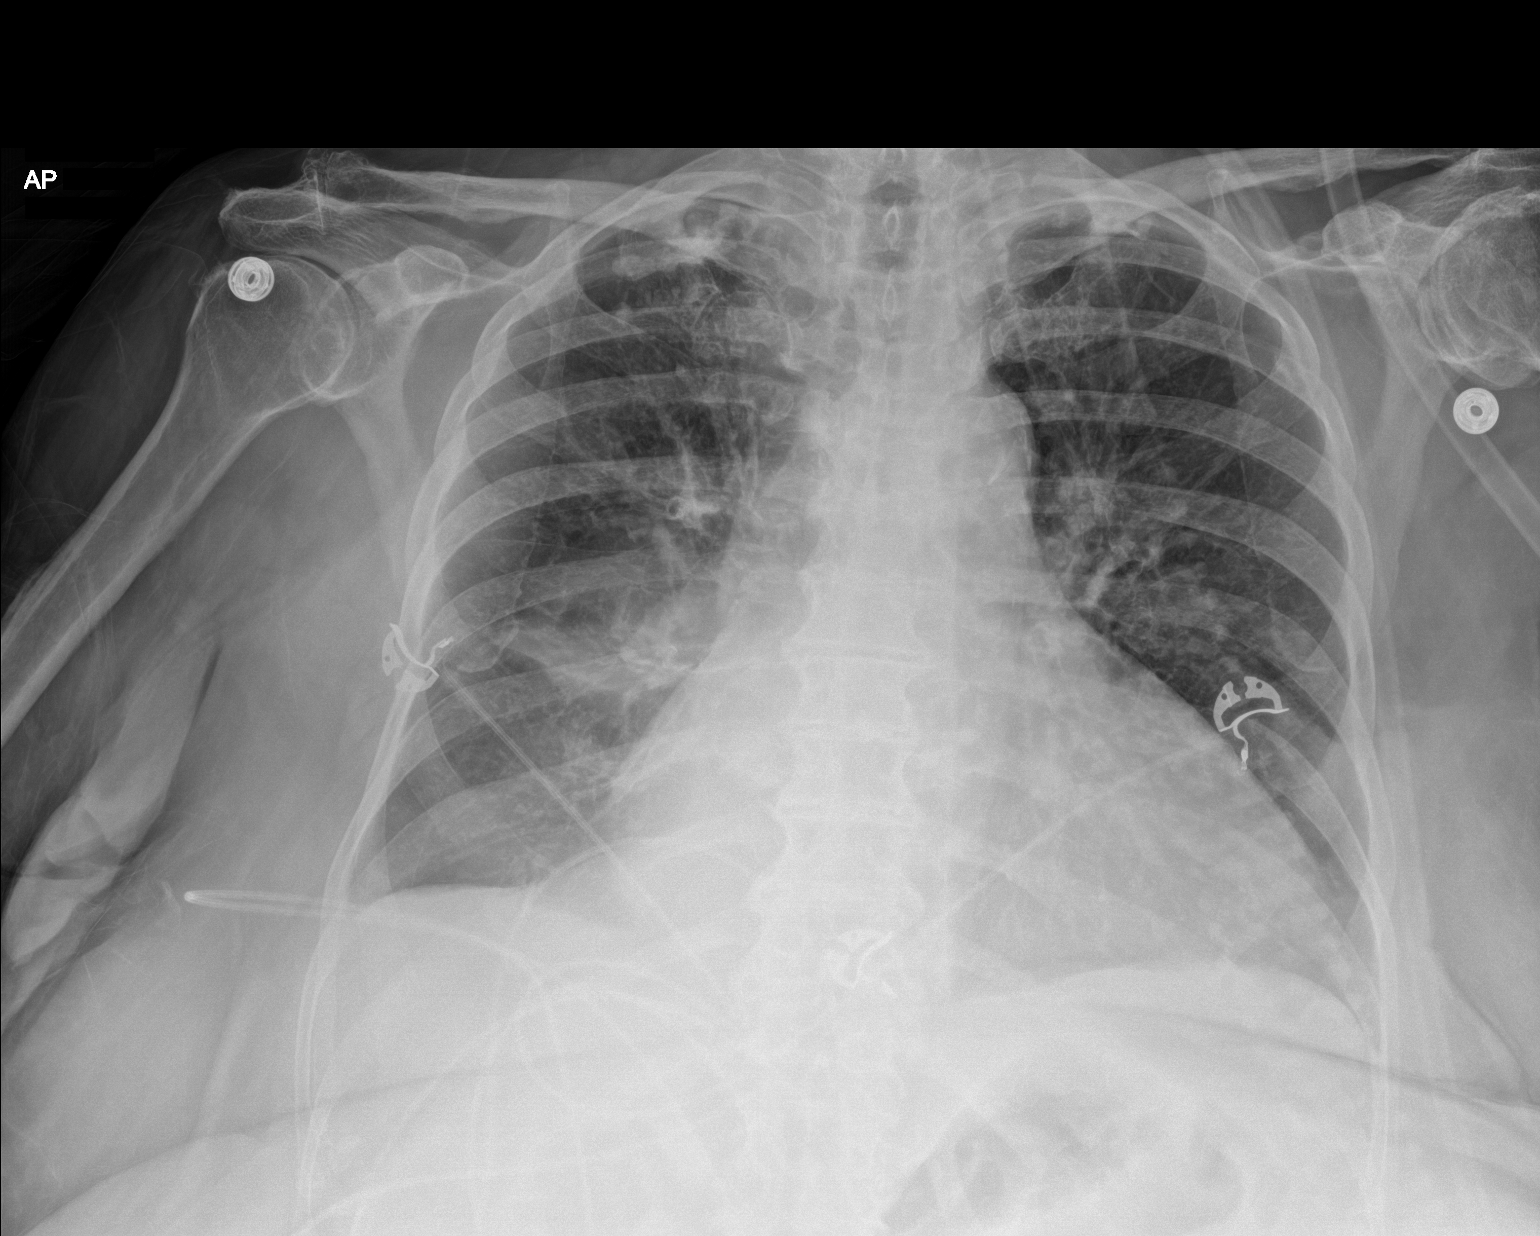

[1 of 1 positions shown; findings below may reference images not displayed]

FINDINGS: Moderate enlargement of the cardiopericardial silhouette noted with
atherosclerotic calcification of the aortic arch. Hazy density in
the right mid lung potentially from fluid or atelectasis within or
along the minor fissure. Mild right retrocardiac airspace opacity
along with accentuated density along the right diaphragm. The left
lung appears clear. Biapical calcifications, likely chronic
postinflammatory. Asymmetric degenerative glenohumeral arthropathy
on the left.
IMPRESSION: 1. Indistinct band of airspace opacity in the right midlung with
right retrocardiac and right retro diaphragmatic airspace opacity.
This could be from pleural effusion with associated atelectasis;
pneumonia is a differential diagnostic consideration.
2. Moderate enlargement of the cardiopericardial silhouette, without
edema.
3.  Aortic Atherosclerosis ([id number]).
4. Biapical pleuroparenchymal scarring with calcifications.
5. Asymmetric left degenerative glenohumeral arthropathy
